# Patient Record
Sex: Female | Born: 1945
Health system: Southern US, Community
[De-identification: ages and names within clinical notes are randomized; demographics above are authoritative.]

## PROBLEM LIST (undated history)

## (undated) DIAGNOSIS — D649 Anemia, unspecified: Secondary | ICD-10-CM

## (undated) DIAGNOSIS — K219 Gastro-esophageal reflux disease without esophagitis: Secondary | ICD-10-CM

## (undated) DIAGNOSIS — I10 Essential (primary) hypertension: Secondary | ICD-10-CM

## (undated) DIAGNOSIS — H269 Unspecified cataract: Secondary | ICD-10-CM

## (undated) DIAGNOSIS — C4491 Basal cell carcinoma of skin, unspecified: Secondary | ICD-10-CM

## (undated) DIAGNOSIS — M1990 Unspecified osteoarthritis, unspecified site: Secondary | ICD-10-CM

## (undated) DIAGNOSIS — K5790 Diverticulosis of intestine, part unspecified, without perforation or abscess without bleeding: Secondary | ICD-10-CM

## (undated) DIAGNOSIS — E039 Hypothyroidism, unspecified: Secondary | ICD-10-CM

## (undated) DIAGNOSIS — K635 Polyp of colon: Secondary | ICD-10-CM

## (undated) DIAGNOSIS — G43909 Migraine, unspecified, not intractable, without status migrainosus: Secondary | ICD-10-CM

## (undated) DIAGNOSIS — M8580 Other specified disorders of bone density and structure, unspecified site: Secondary | ICD-10-CM

## (undated) DIAGNOSIS — M810 Age-related osteoporosis without current pathological fracture: Secondary | ICD-10-CM

## (undated) HISTORY — DX: Gastro-esophageal reflux disease without esophagitis: K21.9

## (undated) HISTORY — DX: Unspecified cataract: H26.9

## (undated) HISTORY — DX: Basal cell carcinoma of skin, unspecified: C44.91

## (undated) HISTORY — DX: Migraine, unspecified, not intractable, without status migrainosus: G43.909

## (undated) HISTORY — DX: Polyp of colon: K63.5

## (undated) HISTORY — DX: Other specified disorders of bone density and structure, unspecified site: M85.80

## (undated) HISTORY — DX: Age-related osteoporosis without current pathological fracture: M81.0

## (undated) HISTORY — DX: Diverticulosis of intestine, part unspecified, without perforation or abscess without bleeding: K57.90

## (undated) HISTORY — PX: COLONOSCOPY: SHX174

## (undated) HISTORY — DX: Anemia, unspecified: D64.9

---
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
## 1996-05-17 HISTORY — PX: CARPAL TUNNEL RELEASE: SHX101

## 1996-05-17 HISTORY — PX: OTHER SURGICAL HISTORY: SHX169

## 1998-04-16 ENCOUNTER — Other Ambulatory Visit: Admission: RE | Admit: 1998-04-16 | Discharge: 1998-04-16 | Payer: Self-pay | Admitting: Obstetrics and Gynecology

## 2005-10-25 ENCOUNTER — Encounter: Admission: RE | Admit: 2005-10-25 | Discharge: 2005-10-25 | Payer: Self-pay | Admitting: Endocrinology

## 2006-01-01 ENCOUNTER — Emergency Department (HOSPITAL_COMMUNITY): Admission: EM | Admit: 2006-01-01 | Discharge: 2006-01-01 | Payer: Self-pay | Admitting: Emergency Medicine

## 2012-08-27 ENCOUNTER — Encounter (HOSPITAL_COMMUNITY): Payer: Self-pay | Admitting: *Deleted

## 2012-08-27 ENCOUNTER — Emergency Department (HOSPITAL_COMMUNITY)
Admission: EM | Admit: 2012-08-27 | Discharge: 2012-08-27 | Disposition: A | Discharge status: Home / Self Care | Payer: PRIVATE HEALTH INSURANCE | Attending: Emergency Medicine | Admitting: Emergency Medicine

## 2012-08-27 DIAGNOSIS — Y929 Unspecified place or not applicable: Secondary | ICD-10-CM | POA: Insufficient documentation

## 2012-08-27 DIAGNOSIS — W2203XA Walked into furniture, initial encounter: Secondary | ICD-10-CM | POA: Insufficient documentation

## 2012-08-27 DIAGNOSIS — Z23 Encounter for immunization: Secondary | ICD-10-CM | POA: Insufficient documentation

## 2012-08-27 DIAGNOSIS — M129 Arthropathy, unspecified: Secondary | ICD-10-CM | POA: Insufficient documentation

## 2012-08-27 DIAGNOSIS — Y9389 Activity, other specified: Secondary | ICD-10-CM | POA: Insufficient documentation

## 2012-08-27 DIAGNOSIS — E039 Hypothyroidism, unspecified: Secondary | ICD-10-CM | POA: Insufficient documentation

## 2012-08-27 DIAGNOSIS — S0180XA Unspecified open wound of other part of head, initial encounter: Secondary | ICD-10-CM | POA: Insufficient documentation

## 2012-08-27 DIAGNOSIS — I10 Essential (primary) hypertension: Secondary | ICD-10-CM | POA: Insufficient documentation

## 2012-08-27 DIAGNOSIS — S0181XA Laceration without foreign body of other part of head, initial encounter: Secondary | ICD-10-CM

## 2012-08-27 HISTORY — DX: Essential (primary) hypertension: I10

## 2012-08-27 HISTORY — DX: Hypothyroidism, unspecified: E03.9

## 2012-08-27 HISTORY — DX: Unspecified osteoarthritis, unspecified site: M19.90

## 2012-08-27 MED ORDER — TETANUS-DIPHTH-ACELL PERTUSSIS 5-2.5-18.5 LF-MCG/0.5 IM SUSP
0.5000 mL | Freq: Once | INTRAMUSCULAR | Status: AC
  Administered 2012-08-27: 0.5 mL via INTRAMUSCULAR
  Filled 2012-08-27: qty 0.5

## 2012-08-27 NOTE — ED Provider Notes (Signed)
History    This chart was scribed for non-physician practitioner Wynetta Emery PA-C working with Derwood Kaplan, MD by Smitty Pluck, ED scribe. This patient was seen in room WTR8/WTR8 and the patient's care was started at 8:19 PM.   CSN: [id number]  Arrival date & time 08/27/12  1829      Chief Complaint  Patient presents with  . Facial Laceration     The history is provided by the patient. No language interpreter was used.   Alexis Beard is a 67 y.o. female who presents to the Emergency Department complaining of facial laceration onset today. Pt reports that she was moving an umbrella stand and it came back hitting her in the forehead. Pt denies headache, blurred vision, fever, chills, nausea, vomiting, diarrhea, weakness, cough, SOB and any other pain. She denies taking blood thinners. Denies tetanus being UTD.    Past Medical History  Diagnosis Date  . Hypertension   . Hypothyroid   . Arthritis     Past Surgical History  Procedure Laterality Date  . Carpal tunnel release      History reviewed. No pertinent family history.  History  Substance Use Topics  . Smoking status: Never Smoker   . Smokeless tobacco: Never Used  . Alcohol Use: No    OB History   Grav Para Term Preterm Abortions TAB SAB Ect Mult Living                  Review of Systems  Constitutional: Negative for fever.  Respiratory: Negative for shortness of breath.   Cardiovascular: Negative for chest pain.  Gastrointestinal: Negative for nausea, vomiting, abdominal pain and diarrhea.  Skin: Positive for wound.  All other systems reviewed and are negative.    Allergies  Review of patient's allergies indicates not on file.  Home Medications  No current outpatient prescriptions on file.  BP 166/78  Pulse 63  Temp(Src) 98.4 F (36.9 C) (Oral)  Resp 18  SpO2 99%  Physical Exam  Nursing note and vitals reviewed. Constitutional: She is oriented to person, place, and time. She appears  well-developed and well-nourished. No distress.  HENT:  Head: Normocephalic.  Mouth/Throat: Oropharynx is clear and moist.  2cm partial and full thickness, non-jagged laceration to left forehead.   Eyes: Conjunctivae and EOM are normal. Pupils are equal, round, and reactive to light.  Neck: Normal range of motion. Neck supple.  No midline tenderness to palpation or step-offs appreciated. Patient has full range of motion without pain.   Cardiovascular: Normal rate.   Pulmonary/Chest: Effort normal and breath sounds normal. No stridor. No respiratory distress. She has no wheezes. She has no rales. She exhibits no tenderness.  Abdominal: Soft. Bowel sounds are normal. She exhibits no distension and no mass. There is no tenderness. There is no rebound and no guarding.  Musculoskeletal: Normal range of motion.  Neurological: She is alert and oriented to person, place, and time.  Cranial nerves III through XII intact, strength 5 out of 5x4 extremities, negative pronator drift, finger to nose and heel-to-shin coordinated, sensation intact to pinprick and light touch, gait is coordinated and Romberg is negative.   Psychiatric: She has a normal mood and affect.    ED Course  Procedures (including critical care time) DIAGNOSTIC STUDIES: Oxygen Saturation is 99% on room air, normal by my interpretation.    COORDINATION OF CARE: 8:20 PM Discussed ED treatment with pt and pt agrees.  8:22 PM Ordered:  Medications  TDaP (BOOSTRIX)  injection 0.5 mL (0.5 mLs Intramuscular Given 08/27/12 2100)   LACERATION REPAIR Performed by: Wynetta Emery PA-C Consent: Verbal consent obtained. Risks and benefits: risks, benefits and alternatives were discussed Patient identity confirmed: provided demographic data Time out performed prior to procedure Prepped and Draped in normal sterile fashion Wound explored Laceration Location: left forehead Laceration Length: 2cm No Foreign Bodies seen or  palpated Anesthesia: local infiltration Local anesthetic: none Anesthetic total: N/A Irrigation method: syringe Amount of cleaning: standard Skin closure: yes Number of sutures or staples: derma-bond  Technique:  Patient tolerance: Patient tolerated the procedure well with no immediate complications.    Labs Reviewed - No data to display No results found.   1. Facial laceration, initial encounter       MDM   Alexis Beard is a 67 y.o. female with head laceration, questionable LOC, normal neuro exam, forehead laceration repaired without complication.    Filed Vitals:   08/27/12 1840  BP: 166/78  Pulse: 63  Temp: 98.4 F (36.9 C)  TempSrc: Oral  Resp: 18  SpO2: 99%     Pt verbalized understanding and agrees with care plan. Outpatient follow-up and return precautions given.      Wynetta Emery, PA-C 08/31/12 0801  Medical screening examination/treatment/procedure(s) were performed by non-physician practitioner and as supervising physician I was immediately available for consultation/collaboration.  Derwood Kaplan, MD 08/31/12 [phone]

## 2012-08-27 NOTE — ED Notes (Signed)
Pt states was moving an umbrella stand, came back and hit her in the head, states blacked out for a second, denies headache, blurred vision, or nausea at this time, states area is sore, denies being on blood thinners, pt a/o x 4.

## 2012-12-11 ENCOUNTER — Other Ambulatory Visit: Payer: Self-pay | Admitting: *Deleted

## 2012-12-11 MED ORDER — AMLODIPINE BESYLATE 10 MG PO TABS: 5.0000 mg | ORAL_TABLET | Freq: Every day | ORAL | Status: DC

## 2013-02-07 ENCOUNTER — Other Ambulatory Visit: Payer: Self-pay | Admitting: *Deleted

## 2013-02-07 DIAGNOSIS — E039 Hypothyroidism, unspecified: Secondary | ICD-10-CM | POA: Insufficient documentation

## 2013-02-08 ENCOUNTER — Other Ambulatory Visit: Payer: Self-pay | Admitting: Endocrinology

## 2013-02-08 DIAGNOSIS — E559 Vitamin D deficiency, unspecified: Secondary | ICD-10-CM

## 2013-02-08 DIAGNOSIS — R7303 Prediabetes: Secondary | ICD-10-CM

## 2013-02-13 ENCOUNTER — Other Ambulatory Visit: Payer: PRIVATE HEALTH INSURANCE

## 2013-02-15 ENCOUNTER — Ambulatory Visit: Payer: PRIVATE HEALTH INSURANCE | Admitting: Endocrinology

## 2013-05-02 ENCOUNTER — Other Ambulatory Visit (INDEPENDENT_AMBULATORY_CARE_PROVIDER_SITE_OTHER): Payer: Commercial Indemnity

## 2013-05-02 ENCOUNTER — Other Ambulatory Visit: Payer: Self-pay | Admitting: *Deleted

## 2013-05-02 DIAGNOSIS — R7309 Other abnormal glucose: Secondary | ICD-10-CM

## 2013-05-02 DIAGNOSIS — E559 Vitamin D deficiency, unspecified: Secondary | ICD-10-CM

## 2013-05-02 DIAGNOSIS — R7303 Prediabetes: Secondary | ICD-10-CM

## 2013-05-02 DIAGNOSIS — E039 Hypothyroidism, unspecified: Secondary | ICD-10-CM

## 2013-05-02 LAB — TSH: TSH: 5.12 u[IU]/mL (ref 0.35–5.50)

## 2013-05-02 LAB — COMPREHENSIVE METABOLIC PANEL
Albumin: 4.5 g/dL (ref 3.5–5.2)
Alkaline Phosphatase: 75 U/L (ref 39–117)
Calcium: 9.3 mg/dL (ref 8.4–10.5)
Chloride: 103 mEq/L (ref 96–112)
Creatinine, Ser: 0.8 mg/dL (ref 0.4–1.2)
GFR: 73.85 mL/min (ref >60.00)
Sodium: 139 mEq/L (ref 135–145)

## 2013-05-02 LAB — LIPID PANEL
Cholesterol: 232 mg/dL — ABNORMAL HIGH (ref 0–200)
Total CHOL/HDL Ratio: 3
VLDL: 21.6 mg/dL (ref 0.0–40.0)

## 2013-05-02 LAB — LDL CHOLESTEROL, DIRECT: Direct LDL: 135.4 mg/dL

## 2013-05-02 LAB — HEMOGLOBIN A1C: Hgb A1c MFr Bld: 5.8 % (ref 4.6–6.5)

## 2013-05-02 MED ORDER — LEVOTHYROXINE SODIUM 88 MCG PO TABS: 88.0000 ug | ORAL_TABLET | Freq: Every day | ORAL | Status: DC

## 2013-05-03 LAB — VITAMIN D 25 HYDROXY (VIT D DEFICIENCY, FRACTURES): Vit D, 25-Hydroxy: 40 ng/mL (ref 30–89)

## 2013-05-08 ENCOUNTER — Ambulatory Visit (INDEPENDENT_AMBULATORY_CARE_PROVIDER_SITE_OTHER): Payer: Commercial Indemnity | Admitting: Endocrinology

## 2013-05-08 ENCOUNTER — Encounter: Payer: Self-pay | Admitting: Endocrinology

## 2013-05-08 VITALS — BP 112/72 | HR 73 | Temp 98.3°F | Resp 12 | Ht 63.0 in | Wt 190.2 lb

## 2013-05-08 DIAGNOSIS — M545 Low back pain, unspecified: Secondary | ICD-10-CM

## 2013-05-08 DIAGNOSIS — E039 Hypothyroidism, unspecified: Secondary | ICD-10-CM

## 2013-05-08 DIAGNOSIS — E785 Hyperlipidemia, unspecified: Secondary | ICD-10-CM | POA: Insufficient documentation

## 2013-05-08 DIAGNOSIS — R7303 Prediabetes: Secondary | ICD-10-CM

## 2013-05-08 DIAGNOSIS — E559 Vitamin D deficiency, unspecified: Secondary | ICD-10-CM

## 2013-05-08 DIAGNOSIS — R7309 Other abnormal glucose: Secondary | ICD-10-CM

## 2013-05-08 DIAGNOSIS — I10 Essential (primary) hypertension: Secondary | ICD-10-CM | POA: Insufficient documentation

## 2013-05-08 MED ORDER — LEVOTHYROXINE SODIUM 100 MCG PO TABS: 100.0000 ug | ORAL_TABLET | Freq: Every day | ORAL | Status: DC

## 2013-05-08 NOTE — Patient Instructions (Signed)
Low saturated fat diet  Exercise as tolerated

## 2013-05-08 NOTE — Progress Notes (Signed)
Patient ID: Alexis Beard, female   DOB: 11-Aug-1945, 67 y.o.   MRN: [id number]  Alexis Beard is a 67 y.o. female.   Chief complaint:  Followup of multiple problems  History of Present Illness:  1. Her previous records are not available but she has had hypothyroidism since about 1995 there has been usually on a steady dose of 88 mcg. Several years ago was taking 112 mcg.Marland Kitchen Her TSH has been usually quite normal. She feels fairly good without any unusual fatigue or weight change. She has been taking her medication very consistently even recently when she had nausea but her TSH is higher. No cold intolerance has not used any iron or calcium supplements 2. She has had an upper respiratory infection for at least a week and has been treated by an urgent care Center. She had significant cough and was given various medications. However because of the cough she had a pain start on the right lower back area which is usually present on coughing or movement. It is somewhat better. Her cough is also improving with clear sputum now. She is taking doxycycline and has 4 days left. Did have some nausea with this 3. Her lipids are relatively high. Usually her LDL has been near 100 in the past and has not been on any medications. She thinks she is eating more desserts like cakes which she has been baking; she is  now semiretired. She is also eating meat regularly since her husband prefers this 4.  She thinks she had shingles recently and was having a rash; was given? Famvir  by urgent care Center but this caused nausea. Records not available 5. She has a history of prediabetes and her glucose is borderline at 101 with normal A1c 6. Hypertension: This is well-controlled with only 5 mg Norvasc 7. Vitamin D deficiency: She normally takes 50,000 units weekly and has just started back on this month ago. She did not take it during summer because she thought she was getting enough through sunshine. Her level is therapeutic       Medication List       This list is accurate as of: 05/08/13  9:07 PM.  Always use your most recent med list.               amLODipine 10 MG tablet  Commonly known as:  NORVASC  Take 0.5 tablets (5 mg total) by mouth daily.     levothyroxine 100 MCG tablet  Commonly known as:  SYNTHROID, LEVOTHROID  Take 1 tablet (100 mcg total) by mouth daily.     meloxicam 7.5 MG tablet  Commonly known as:  MOBIC  Take 7.5 mg by mouth daily.     Vitamin D (Ergocalciferol) 50000 UNITS Caps capsule  Commonly known as:  DRISDOL  Take 50,000 Units by mouth every 7 (seven) days.        Allergies:  Allergies  Allergen Reactions  . Codeine Nausea And Vomiting    Past Medical History  Diagnosis Date  . Hypertension   . Hypothyroid   . Arthritis   . Melanoma     Foot    Past Surgical History  Procedure Laterality Date  . Carpal tunnel release    . Birth mark removal      on (R) side of face    Family History  Problem Relation Age of Onset  . Diabetes Mother   . Hyperlipidemia Father   . Hyperlipidemia Sister   . Thyroid disease  Neg Hx     Social History:  reports that she has never smoked. She has never used smokeless tobacco. She reports that she does not drink alcohol or use illicit drugs.  Review of Systems   Appointment on 05/02/2013  Component Date Value Range Status  . Free T4 05/02/2013 0.87  0.60 - 1.60 ng/dL Final  . TSH 16/02/9603 5.12  0.35 - 5.50 uIU/mL Final  . Sodium 05/02/2013 139  135 - 145 mEq/L Final  . Potassium 05/02/2013 4.1  3.5 - 5.1 mEq/L Final  . Chloride 05/02/2013 103  96 - 112 mEq/L Final  . CO2 05/02/2013 28  19 - 32 mEq/L Final  . Glucose, Bld 05/02/2013 101* 70 - 99 mg/dL Final  . BUN 54/01/8118 14  6 - 23 mg/dL Final  . Creatinine, Ser 05/02/2013 0.8  0.4 - 1.2 mg/dL Final  . Total Bilirubin 05/02/2013 0.5  0.3 - 1.2 mg/dL Final  . Alkaline Phosphatase 05/02/2013 75  39 - 117 U/L Final  . AST 05/02/2013 21  0 - 37 U/L Final   . ALT 05/02/2013 18  0 - 35 U/L Final  . Total Protein 05/02/2013 7.6  6.0 - 8.3 g/dL Final  . Albumin 14/78/2956 4.5  3.5 - 5.2 g/dL Final  . Calcium 21/30/8657 9.3  8.4 - 10.5 mg/dL Final  . GFR 84/69/6295 73.85  >60.00 mL/min Final  . Cholesterol 05/02/2013 232* 0 - 200 mg/dL Final   ATP III Classification       Desirable:  < 200 mg/dL               Borderline High:  200 - 239 mg/dL          High:  > = 284 mg/dL  . Triglycerides 05/02/2013 108.0  0.0 - 149.0 mg/dL Final   Normal:  <132 mg/dLBorderline High:  150 - 199 mg/dL  . HDL 05/02/2013 77.10  >39.00 mg/dL Final  . VLDL 44/05/270 21.6  0.0 - 40.0 mg/dL Final  . Total CHOL/HDL Ratio 05/02/2013 3   Final                  Men          Women1/2 Average Risk     3.4          3.3Average Risk          5.0          4.42X Average Risk          9.6          7.13X Average Risk          15.0          11.0                      . Vit D, 25-Hydroxy 05/02/2013 40  30 - 89 ng/mL Final   Comment: This assay accurately quantifies Vitamin D, which is the sum of the                          25-Hydroxy forms of Vitamin D2 and D3.  Studies have shown that the                          optimum concentration of 25-Hydroxy Vitamin D is 30 ng/mL or higher.  Concentrations of Vitamin D between 20 and 29 ng/mL are considered to                          be insufficient and concentrations less than 20 ng/mL are considered                          to be deficient for Vitamin D.  . Hemoglobin A1C 05/02/2013 5.8  4.6 - 6.5 % Final   Glycemic Control Guidelines for People with Diabetes:Non Diabetic:  <6%Goal of Therapy: <7%Additional Action Suggested:  >8%   . Direct LDL 05/02/2013 135.4   Final   Optimal:  <100 mg/dLNear or Above Optimal:  100-129 mg/dLBorderline High:  130-159 mg/dLHigh:  160-189 mg/dLVery High:  >190 mg/dL    EXAM:  BP 161/09  Pulse 73  Temp(Src) 98.3 F (36.8 C)  Resp 12  Ht 5\' 3"  (1.6 m)  Wt 190 lb 3.2 oz  (86.274 kg)  BMI 33.70 kg/m2  SpO2 94%  Her right lower back pain is indicated in the paraspinal area but there is no muscle spasm or tenderness. No rib cage tenderness Lungs clear No edema  Assessment/Plan:    Primary hypothyroidism: Since her TSH is upper normal to increase her dosage to 100 mcg and she will followup in 3 months  Hyperlipidemia: Discussed low saturated fat diet. Given handout on this and discussed need for consistent improving diet especially with her history of prediabetes  Low back pain: She appears to have muscle strain, no specific treatment needed  Vitamin D deficiency: Continue weekly dosage to winter at least  Hypertension: Well controlled and she will stay on Norvasc   [facility] 05/08/2013, 9:07 PM

## 2013-08-01 ENCOUNTER — Other Ambulatory Visit: Payer: PRIVATE HEALTH INSURANCE

## 2013-08-02 ENCOUNTER — Other Ambulatory Visit: Payer: PRIVATE HEALTH INSURANCE

## 2013-08-02 ENCOUNTER — Other Ambulatory Visit (INDEPENDENT_AMBULATORY_CARE_PROVIDER_SITE_OTHER): Payer: Commercial Indemnity

## 2013-08-02 DIAGNOSIS — I10 Essential (primary) hypertension: Secondary | ICD-10-CM

## 2013-08-02 DIAGNOSIS — M545 Low back pain, unspecified: Secondary | ICD-10-CM

## 2013-08-02 DIAGNOSIS — E785 Hyperlipidemia, unspecified: Secondary | ICD-10-CM

## 2013-08-02 DIAGNOSIS — E039 Hypothyroidism, unspecified: Secondary | ICD-10-CM

## 2013-08-02 LAB — T4, FREE: Free T4: 1.04 ng/dL (ref 0.60–1.60)

## 2013-08-02 LAB — URINALYSIS, ROUTINE W REFLEX MICROSCOPIC
Bilirubin Urine: NEGATIVE
Ketones, ur: NEGATIVE
Leukocytes, UA: NEGATIVE
Nitrite: NEGATIVE
PH: 5 (ref 5.0–8.0)
TOTAL PROTEIN, URINE-UPE24: NEGATIVE
UROBILINOGEN UA: 0.2 (ref 0.0–1.0)
Urine Glucose: NEGATIVE

## 2013-08-02 LAB — LIPID PANEL
CHOLESTEROL: 192 mg/dL (ref 0–200)
HDL: 75 mg/dL (ref >39.00)
LDL Cholesterol: 94 mg/dL (ref 0–99)
TRIGLYCERIDES: 117 mg/dL (ref 0.0–149.0)
Total CHOL/HDL Ratio: 3
VLDL: 23.4 mg/dL (ref 0.0–40.0)

## 2013-08-02 LAB — COMPREHENSIVE METABOLIC PANEL
ALBUMIN: 4.4 g/dL (ref 3.5–5.2)
ALK PHOS: 68 U/L (ref 39–117)
ALT: 16 U/L (ref 0–35)
AST: 16 U/L (ref 0–37)
BILIRUBIN TOTAL: 0.5 mg/dL (ref 0.3–1.2)
BUN: 12 mg/dL (ref 6–23)
CALCIUM: 9.2 mg/dL (ref 8.4–10.5)
CO2: 26 mEq/L (ref 19–32)
CREATININE: 0.7 mg/dL (ref 0.4–1.2)
Chloride: 104 mEq/L (ref 96–112)
GFR: 84.39 mL/min (ref >60.00)
GLUCOSE: 90 mg/dL (ref 70–99)
Potassium: 4.3 mEq/L (ref 3.5–5.1)
Sodium: 140 mEq/L (ref 135–145)
TOTAL PROTEIN: 7.5 g/dL (ref 6.0–8.3)

## 2013-08-02 LAB — CBC
HEMATOCRIT: 40.6 % (ref 36.0–46.0)
HEMOGLOBIN: 13.5 g/dL (ref 12.0–15.0)
MCHC: 33.3 g/dL (ref 30.0–36.0)
MCV: 90.2 fl (ref 78.0–100.0)
PLATELETS: 255 10*3/uL (ref 150.0–400.0)
RBC: 4.51 Mil/uL (ref 3.87–5.11)
RDW: 13.3 % (ref 11.5–14.6)
WBC: 5.1 10*3/uL (ref 4.5–10.5)

## 2013-08-02 LAB — TSH: TSH: 1.09 u[IU]/mL (ref 0.35–5.50)

## 2013-08-07 ENCOUNTER — Encounter: Payer: Self-pay | Admitting: Endocrinology

## 2013-08-07 ENCOUNTER — Ambulatory Visit (INDEPENDENT_AMBULATORY_CARE_PROVIDER_SITE_OTHER): Payer: Commercial Indemnity | Admitting: Endocrinology

## 2013-08-07 VITALS — BP 128/82 | HR 64 | Temp 98.3°F | Resp 16 | Ht 63.0 in | Wt 190.6 lb

## 2013-08-07 DIAGNOSIS — Z0000 Encounter for general adult medical examination without abnormal findings: Secondary | ICD-10-CM

## 2013-08-07 DIAGNOSIS — R7303 Prediabetes: Secondary | ICD-10-CM

## 2013-08-07 DIAGNOSIS — I10 Essential (primary) hypertension: Secondary | ICD-10-CM

## 2013-08-07 DIAGNOSIS — R7309 Other abnormal glucose: Secondary | ICD-10-CM

## 2013-08-07 DIAGNOSIS — E785 Hyperlipidemia, unspecified: Secondary | ICD-10-CM

## 2013-08-07 MED ORDER — CELECOXIB 200 MG PO CAPS: 200.0000 mg | ORAL_CAPSULE | Freq: Every day | ORAL | Status: DC

## 2013-08-07 NOTE — Patient Instructions (Addendum)
Continue regular walking Followup with gastroenterologist for colonoscopy  You will be scheduled for followup mammogram  Take vitamin D once a month regularly  No change in the blood pressure or thyroid medication  Continue to reduce saturated fats from fatty meats and dairy products  Switch meloxicam to Celebrex 200 mg as needed for low back pain

## 2013-08-07 NOTE — Progress Notes (Signed)
Patient ID: Alexis Beard, female   DOB: 23-Jul-1945, 68 y.o.   MRN: [id number]   Chief complaint:  Followup of multiple problems  History of Present Illness:     She has had hypothyroidism since about 1995 and previously had been  on a steady dose of 88 mcg. Several years ago was taking 112 mcg.Marland Kitchen Her TSH was higher in 12/14 and her dose was increased to 100 mcg. She feels fairly good without any unusual fatigue or cold intolerance. She has been taking her Synthroid very consistently in the mornings.   Lab Results  Component Value Date   TSH 1.09 08/02/2013   Her cholesterol was higher than usual on the last visit.  Usually her LDL has been near 100 in the past and has not been on any medications. She apparently has tried to change her diet with cutting out desserts like cakes and usually trying to eat low fat meats. LDL has improved and is below 100 again.    She has a history of prediabetes and her glucose is better at 90 now with normal A1c on the last visit   Hypertension: she has had long-standing mild hypertension which is well-controlled with only 5 mg Norvasc recently   Vitamin D deficiency: She previously had been on 50,000 units weekly and had taken this more regularly during winter months but only sporadically now. Her last level was 40.   PREVENTIVE CARE:  Diet: Avoiding sweets and cutting back on high fat foods Exercise: Recently starting to walk, limited by hip pain History of falls: None   Stool Hemoccults:           at annual exams   Breast self exams:                             yes   Colonoscopy/sigmoidoscopy 02/2009 Mammograms:  ? 2013   Yearly flu vaccine:                      none in 2014   Bone Density:   ? 2013  Calcium supplements:  yes  Tetanus booster:  ?   Zostavax: none Pneumovax:  no    Lipid screening, last levels:  Lab Results  Component Value Date   CHOL 192 08/02/2013   HDL 75.00 08/02/2013   LDLCALC 94 08/02/2013   LDLDIRECT 135.4 05/02/2013    TRIG 117.0 08/02/2013   CHOLHDL 3 08/02/2013       Medication List       This list is accurate as of: 08/07/13 11:17 AM.  Always use your most recent med list.               amLODipine 10 MG tablet  Commonly known as:  NORVASC  Take 0.5 tablets (5 mg total) by mouth daily.     celecoxib 200 MG capsule  Commonly known as:  CELEBREX  Take 1 capsule (200 mg total) by mouth daily.     levothyroxine 100 MCG tablet  Commonly known as:  SYNTHROID, LEVOTHROID  Take 1 tablet (100 mcg total) by mouth daily.     Vitamin D (Ergocalciferol) 50000 UNITS Caps capsule  Commonly known as:  DRISDOL  Take 50,000 Units by mouth every 7 (seven) days.        Allergies:  Allergies  Allergen Reactions  . Codeine Nausea And Vomiting    Past Medical History  Diagnosis Date  . Hypertension   .  Hypothyroid   . Arthritis   . Cancer     Basal cell Carcinoma    Past Surgical History  Procedure Laterality Date  . Carpal tunnel release    . Birth mark removal      on (R) side of face    Family History  Problem Relation Age of Onset  . Diabetes Mother   . Thyroid disease Mother   . Hyperlipidemia Father   . Heart disease Father   . Hyperlipidemia Sister   . Cancer Maternal Aunt     Breast Cancer  . Thyroid disease Maternal Aunt     Social History:  reports that she has never smoked. She has never used smokeless tobacco. She reports that she does not drink alcohol or use illicit drugs.  Review of Systems       Eyes: Has been told to have early cataracts.   ENT: No nasal congestion or ear problems    No recent headaches.  History of recurrent migraines but none since Norvasc started in 2/06    Skin: Has several moles and history of cancers. Currently followed by a dermatologist.    Thyroid:  had goiter at diagnosis of hypothyroidism over 10 years ago.  In that time she had improvement in fatigue and 40 pounds weight loss with thyroxine replacement.  Synthroid was reduced to 0.1  mg in 7/05     No chest pain on exertion.                 No palpitations except when she is anxious.     No swelling of feet.     No shortness of breath on exertion.     Bowel habits:  chronic constipation.          No abdominal pain.  Told to have diverticulosis and has had colon polyps. Recommended colonoscopy every 3 years       No frequency of urination, nocturia.      Gynecological symptoms: no recent hot flushes.  Previously had taken Prempro      She has had mild joint pains especially in the morning in her fingers.    She has had periodic low back/left buttock pain.  She had L2-3 degenerative disc disease on her spine x-ray.  Now will occasionally take Mobic as needed with relief.     Has osteopenia with T score on the femoral neck -1.5 in 9/08.  Original height 5'5". Has been prescribed Evista and also tried on Actonel but did not continue it.     She has had periodic leg cramps in her legs. Less frequently now.  LABS:  Appointment on 08/02/2013  Component Date Value Ref Range Status  . Sodium 08/02/2013 140  135 - 145 mEq/L Final  . Potassium 08/02/2013 4.3  3.5 - 5.1 mEq/L Final  . Chloride 08/02/2013 104  96 - 112 mEq/L Final  . CO2 08/02/2013 26  19 - 32 mEq/L Final  . Glucose, Bld 08/02/2013 90  70 - 99 mg/dL Final  . BUN 04/54/098103/19/2015 12  6 - 23 mg/dL Final  . Creatinine, Ser 08/02/2013 0.7  0.4 - 1.2 mg/dL Final  . Total Bilirubin 08/02/2013 0.5  0.3 - 1.2 mg/dL Final  . Alkaline Phosphatase 08/02/2013 68  39 - 117 U/L Final  . AST 08/02/2013 16  0 - 37 U/L Final  . ALT 08/02/2013 16  0 - 35 U/L Final  . Total Protein 08/02/2013 7.5  6.0 - 8.3 g/dL Final  .  Albumin 08/02/2013 4.4  3.5 - 5.2 g/dL Final  . Calcium 08/02/2013 9.2  8.4 - 10.5 mg/dL Final  . GFR 08/02/2013 84.39  >60.00 mL/min Final  . TSH 08/02/2013 1.09  0.35 - 5.50 uIU/mL Final  . Free T4 08/02/2013 1.04  0.60 - 1.60 ng/dL Final  . WBC 08/02/2013 5.1  4.5 - 10.5 K/uL Final  . RBC 08/02/2013  4.51  3.87 - 5.11 Mil/uL Final  . Platelets 08/02/2013 255.0  150.0 - 400.0 K/uL Final  . Hemoglobin 08/02/2013 13.5  12.0 - 15.0 g/dL Final  . HCT 08/02/2013 40.6  36.0 - 46.0 % Final  . MCV 08/02/2013 90.2  78.0 - 100.0 fl Final  . MCHC 08/02/2013 33.3  30.0 - 36.0 g/dL Final  . RDW 08/02/2013 13.3  11.5 - 14.6 % Final  . Color, Urine 08/02/2013 YELLOW  Yellow;Lt. Yellow Final  . APPearance 08/02/2013 CLEAR  Clear Final  . Specific Gravity, Urine 08/02/2013 >=1.030* 1.000 - 1.030 Final  . pH 08/02/2013 5.0  5.0 - 8.0 Final  . Total Protein, Urine 08/02/2013 NEGATIVE  Negative Final  . Urine Glucose 08/02/2013 NEGATIVE  Negative Final  . Ketones, ur 08/02/2013 NEGATIVE  Negative Final  . Bilirubin Urine 08/02/2013 NEGATIVE  Negative Final  . Hgb urine dipstick 08/02/2013 MODERATE* Negative Final  . Urobilinogen, UA 08/02/2013 0.2  0.0 - 1.0 Final  . Leukocytes, UA 08/02/2013 NEGATIVE  Negative Final  . Nitrite 08/02/2013 NEGATIVE  Negative Final  . WBC, UA 08/02/2013 0-2/hpf  0-2/hpf Final  . RBC / HPF 08/02/2013 0-2/hpf  0-2/hpf Final  . Mucus, UA 08/02/2013 Presence of* None Final  . Squamous Epithelial / LPF 08/02/2013 Rare(0-4/hpf)  Rare(0-4/hpf) Final  . Uric Acid Crys, UA 08/02/2013 Presence of* None Final  . Cholesterol 08/02/2013 192  0 - 200 mg/dL Final   ATP III Classification       Desirable:  < 200 mg/dL               Borderline High:  200 - 239 mg/dL          High:  > = 240 mg/dL  . Triglycerides 08/02/2013 117.0  0.0 - 149.0 mg/dL Final   Normal:  <150 mg/dLBorderline High:  150 - 199 mg/dL  . HDL 08/02/2013 75.00  >39.00 mg/dL Final  . VLDL 08/02/2013 23.4  0.0 - 40.0 mg/dL Final  . LDL Cholesterol 08/02/2013 94  0 - 99 mg/dL Final  . Total CHOL/HDL Ratio 08/02/2013 3   Final                  Men          Women1/2 Average Risk     3.4          3.3Average Risk          5.0          4.42X Average Risk          9.6          7.13X Average Risk          15.0          11.0                         EXAM:  BP 128/82  Pulse 64  Temp(Src) 98.3 F (36.8 C)  Resp 16  Ht 5\' 3"  (1.6 m)  Wt 190 lb 9.6 oz (86.456 kg)  BMI 33.77 kg/m2  SpO2 96%  GENERAL:  Large, mild generalized obesity present.       No pallor, clubbing, lymphadenopathy or edema. No skin rash or pigmentation.   Benign looking skin pigmentation spots and nevi on trunk  EYES:  Externally normal.  Fundii:  normal discs and vessels bilaterally  ENT:   Oral cavity, tongue & pharynx normal.                                     THYROID:  Not palpable.  CAROTIDS:  Normal character; no bruit.  HEART:  Normal S1 and S2; no murmur or click.  CHEST:  Lungs: Vescicular breath sounds heard equally.  No crepitations/ wheeze.  BREASTS:  no mass palpable on either side  ABDOMEN:  No distention.  Liver and spleen not palpable.  No other mass or tenderness.    RECTAL exam:  Not indicated.                     Pelvic:  Not indicated.  NEUROLOGICAL:  Reflexes are normal bilaterally at ankles.  SPINE AND JOINTS:   Right second toe has hammertoe deformity. Has mild kyphoscoliosis of lower thoracic spine.  PERIPHERAL PULSES: posterior tibialis normal bilaterally, absent dorsalis pedis bilaterally.   Assessment/Plan:    Primary hypothyroidism: Since her TSH is back to normal with her dosage of 100 mcg she will continue same doses and will check her levels again in about 6 months  Hyperlipidemia: Continue low saturated fat diet which she is doing well with and has been able to get her LDL back down to below 100  Low back pain: She has had intermittent symptoms. Since she does not tolerate the Mobic because of abdominal distress will switch to generic Celebrex 200 mg  Vitamin D deficiency: Continue supplement indefinitely because of her previous deficiency, she can go to a maintenance dose of once a month  Hypertension: Well controlled and she will stay on Norvasc 5 mg daily. To evaluate EKG  today  PREVENTIVE care:  She needs the following and these will be scheduled as discussed   Mammogram, this will be done soon  Bone density will be deferred since it has been stable since 2006  Colonoscopy: She is due this year and she will call her gastroenterologist as scheduled. Will skip stool Hemoccult this year  Zostavax: She is still reluctant to have this done even though she reports recurrent shingles. She will let us know on her next visit  Pneumovax: She is a candidate with her age of over 32 and again she wants to wait on this  Recommended annual influenza vaccine  Currently does not need prophylactic aspirin as she is not at high risk  Advised regular exercise for weight loss   Alexis Beard 08/07/2013, 11:17 AM

## 2013-08-09 ENCOUNTER — Encounter: Payer: Self-pay | Admitting: Endocrinology

## 2013-12-04 ENCOUNTER — Ambulatory Visit: Payer: Commercial Indemnity | Admitting: Endocrinology

## 2013-12-07 ENCOUNTER — Encounter: Payer: Self-pay | Admitting: Endocrinology

## 2013-12-07 ENCOUNTER — Ambulatory Visit (INDEPENDENT_AMBULATORY_CARE_PROVIDER_SITE_OTHER): Payer: Commercial Indemnity | Admitting: Endocrinology

## 2013-12-07 VITALS — BP 130/70 | HR 62 | Temp 98.5°F | Resp 12 | Wt 186.8 lb

## 2013-12-07 DIAGNOSIS — E039 Hypothyroidism, unspecified: Secondary | ICD-10-CM

## 2013-12-07 DIAGNOSIS — R7303 Prediabetes: Secondary | ICD-10-CM

## 2013-12-07 DIAGNOSIS — R7309 Other abnormal glucose: Secondary | ICD-10-CM

## 2013-12-07 DIAGNOSIS — E785 Hyperlipidemia, unspecified: Secondary | ICD-10-CM

## 2013-12-07 DIAGNOSIS — I10 Essential (primary) hypertension: Secondary | ICD-10-CM

## 2013-12-07 LAB — BASIC METABOLIC PANEL
BUN: 11 mg/dL (ref 6–23)
CALCIUM: 9.5 mg/dL (ref 8.4–10.5)
CO2: 28 mEq/L (ref 19–32)
Chloride: 101 mEq/L (ref 96–112)
Creatinine, Ser: 0.8 mg/dL (ref 0.4–1.2)
GFR: 74.77 mL/min (ref >60.00)
Glucose, Bld: 98 mg/dL (ref 70–99)
Potassium: 3.9 mEq/L (ref 3.5–5.1)
SODIUM: 137 meq/L (ref 135–145)

## 2013-12-07 LAB — HEMOGLOBIN A1C: HEMOGLOBIN A1C: 5.7 % (ref 4.6–6.5)

## 2013-12-07 NOTE — Progress Notes (Signed)
Patient ID: Alexis Beard, female   DOB: May 24, 1945, 68 y.o.   MRN: [id number]   Chief complaint:  Followup of multiple problems  History of Present Illness:     She has had hypothyroidism since about 1995 and previously had been on initial dose of 88 mcg. Several years ago was taking 112 mcg.Marland Kitchen Her TSH was higher in 12/14 and her dose was increased to 100 mcg. TSH normal in 3/15. She feels fairly good without any unusual fatigue or significant weight change. She has been taking her Synthroid consistently in the mornings.   Lab Results  Component Value Date   TSH 1.09 08/02/2013    Her cholesterol has been occasionally high when she does not watch her desserts and meats.  Usually her LDL has been near 100 in the past and has not been on any medications. She is recently trying to be relatively good with her diet and increasing her vegetables and less processed foods   Lab Results  Component Value Date   CHOL 192 08/02/2013   HDL 75.00 08/02/2013   LDLCALC 94 08/02/2013   LDLDIRECT 135.4 05/02/2013   TRIG 117.0 08/02/2013   CHOLHDL 3 08/02/2013      She has a history of prediabetes and her glucose was last 90 with normal A1c. She has been trying to walk her dog and has lost 4 pounds. Has not had any  Fasting labs recently  Lab Results  Component Value Date   HGBA1C 5.8 05/02/2013    Wt Readings from Last 3 Encounters:  12/07/13 186 lb 12.8 oz (84.732 kg)  08/07/13 190 lb 9.6 oz (86.456 kg)  05/08/13 190 lb 3.2 oz (86.274 kg)     Hypertension: she has had long-standing mild hypertension which is well-controlled with only 5 mg Norvasc   Vitamin D deficiency: She previously had been on 50,000 units weekly and since her last visit has taken it once a month as directed. Her last level was 40.       Medication List       This list is accurate as of: 12/07/13  4:05 PM.  Always use your most recent med list.               amLODipine 10 MG tablet  Commonly known as:  NORVASC   Take 0.5 tablets (5 mg total) by mouth daily.     celecoxib 200 MG capsule  Commonly known as:  CELEBREX  Take 1 capsule (200 mg total) by mouth daily.     levothyroxine 100 MCG tablet  Commonly known as:  SYNTHROID, LEVOTHROID  Take 1 tablet (100 mcg total) by mouth daily.     Vitamin D (Ergocalciferol) 50000 UNITS Caps capsule  Commonly known as:  DRISDOL  Take 50,000 Units by mouth every 7 (seven) days.        Allergies:  Allergies  Allergen Reactions  . Codeine Nausea And Vomiting    Past Medical History  Diagnosis Date  . Hypertension   . Hypothyroid   . Arthritis   . Cancer     Basal cell Carcinoma    Past Surgical History  Procedure Laterality Date  . Carpal tunnel release    . Birth mark removal      on (R) side of face    Family History  Problem Relation Age of Onset  . Diabetes Mother   . Thyroid disease Mother   . Hyperlipidemia Father   . Heart disease Father   .  Hyperlipidemia Sister   . Cancer Maternal Aunt     Breast Cancer  . Thyroid disease Maternal Aunt     Social History:  reports that she has never smoked. She has never used smokeless tobacco. She reports that she does not drink alcohol or use illicit drugs.  Review of Systems    She has had periodic low back/left buttock pain.  She had L2-3 degenerative disc disease on her spine x-ray.   She will occasionally take Mobic as needed with relief.     Has osteopenia with T score on the femoral neck -1.5 in 9/08.  Original height 5'5". Has been prescribed Evista and also tried on Actonel but did not continue it.     She has had periodic rash and stinging on her right buttock area and she thinks this is shingles; it may have initially been diagnosed by a dermatologist. She thinks she has some bumps that come in a line. She thinks Zovirax helps treat this and is reluctant to take Zostavax injection today   LABS:  Pending  EXAM:  BP 130/70  Pulse 62  Temp(Src) 98.5 F (36.9 C)  (Oral)  Resp 12  Wt 186 lb 12.8 oz (84.732 kg)  SpO2 97%  No pedal edema present She has minimal varicosities on her lower legs  Assessment/Plan:    Primary hypothyroidism: Check TSH today  Hyperlipidemia: Controlled on diet alone. Continue low saturated fat diet which she is doing well with along with exercise  Hypertension: Well controlled and she will stay on Norvasc 5 mg daily.    PREVENTIVE care:  She needs the following and schedule  herself   Mammogram  Colonoscopy: She is due this year and she will call her gastroenterologist now to get it scheduled  Zostavax: She is still reluctant to have this done even though she reports recurrent shingles  Also consider Pneumovax   Alexis Beard 12/07/2013, 4:05 PM

## 2013-12-10 LAB — T4, FREE: FREE T4: 1.13 ng/dL (ref 0.60–1.60)

## 2013-12-10 LAB — TSH: TSH: 0.28 u[IU]/mL — ABNORMAL LOW (ref 0.35–4.50)

## 2013-12-18 ENCOUNTER — Other Ambulatory Visit: Payer: Self-pay | Admitting: *Deleted

## 2013-12-18 MED ORDER — LEVOTHYROXINE SODIUM 88 MCG PO TABS: 88.0000 ug | ORAL_TABLET | Freq: Every day | ORAL | Status: DC

## 2013-12-19 ENCOUNTER — Telehealth: Payer: Self-pay | Admitting: Endocrinology

## 2013-12-19 NOTE — Telephone Encounter (Signed)
Rhonda please call patient   Thank you

## 2014-01-03 ENCOUNTER — Other Ambulatory Visit: Payer: Self-pay | Admitting: Endocrinology

## 2014-04-02 ENCOUNTER — Encounter: Payer: Self-pay | Admitting: Endocrinology

## 2014-04-02 ENCOUNTER — Ambulatory Visit (INDEPENDENT_AMBULATORY_CARE_PROVIDER_SITE_OTHER): Payer: Commercial Indemnity | Admitting: Endocrinology

## 2014-04-02 VITALS — BP 134/87 | HR 73 | Temp 98.0°F | Resp 14 | Ht 63.0 in | Wt 187.4 lb

## 2014-04-02 DIAGNOSIS — F411 Generalized anxiety disorder: Secondary | ICD-10-CM

## 2014-04-02 DIAGNOSIS — E039 Hypothyroidism, unspecified: Secondary | ICD-10-CM

## 2014-04-02 DIAGNOSIS — E063 Autoimmune thyroiditis: Secondary | ICD-10-CM

## 2014-04-02 DIAGNOSIS — E785 Hyperlipidemia, unspecified: Secondary | ICD-10-CM

## 2014-04-02 DIAGNOSIS — E038 Other specified hypothyroidism: Secondary | ICD-10-CM

## 2014-04-02 DIAGNOSIS — I10 Essential (primary) hypertension: Secondary | ICD-10-CM

## 2014-04-02 LAB — TSH: TSH: 3.11 u[IU]/mL (ref 0.35–4.50)

## 2014-04-02 LAB — T4, FREE: FREE T4: 0.92 ng/dL (ref 0.60–1.60)

## 2014-04-02 NOTE — Progress Notes (Signed)
Patient ID: Alexis Beard, female   DOB: July 09, 1945, 68 y.o.   MRN: [id number]   Chief complaint:  Followup of multiple problems  History of Present Illness:    Reflux and heartburn: She is complaining of more symptoms now which is worse on bending down and is asking about this being caused by drinking coffee. However has had history of gastroesophageal reflux before and previously has used Prilosec 40 mg with relief. Does not have a dry cough as before. Currently using OTC Alka-Seltzer which is helping better thanTums     She has had hypothyroidism since about 1995 and previously had been on initial dose of 88 mcg. Several years ago was taking 112 mcg.Marland Kitchen Her TSH was higher in 12/14 and her dose was increased to 100 mcg.  However because of relatively low TSH the dose was reduced back to 88 g in 7/15 but she has not had a follow-up She feels fairly good without any unusual fatigue or significant weight change. She has been taking her Synthroid consistently in the mornings.   Lab Results  Component Value Date   TSH 0.28* 12/07/2013    Her cholesterol has been periodically high when she does not watch her desserts and meats.  Usually her LDL has been near 100 in the past and has not been on any medications. She is usuallytrying to be relatively good with her diet and increasing her vegetables and less processed foods   Lab Results  Component Value Date   CHOL 192 08/02/2013   HDL 75.00 08/02/2013   LDLCALC 94 08/02/2013   LDLDIRECT 135.4 05/02/2013   TRIG 117.0 08/02/2013   CHOLHDL 3 08/02/2013      She has a history of prediabetes and her glucose was last 90 with normal A1c. She has been irregular with her walking recently.   Wt Readings from Last 3 Encounters:  04/02/14 187 lb 6.4 oz (85.004 kg)  12/07/13 186 lb 12.8 oz (84.732 kg)  08/07/13 190 lb 9.6 oz (86.456 kg)    Lab Results  Component Value Date   HGBA1C 5.7 12/07/2013   HGBA1C 5.8 05/02/2013   Lab Results   Component Value Date   LDLCALC 94 08/02/2013   CREATININE 0.8 12/07/2013      Hypertension: she has had long-standing mild hypertension which previously was well-controlled with only 5 mg Norvasc but blood pressure is significantly higher today   Vitamin D deficiency: She has been prescribed  50,000 units and  has taken it once a month as directed. Her last level was 40.    She is complaining of anxiety and getting tense easily as well as some difficulty sleeping at night.  Having some stress issues also.  Does not want a medication for sleep or anxiety at this time.  Does not think she is depressed      Medication List       This list is accurate as of: 04/02/14 11:45 AM.  Always use your most recent med list.               amLODipine 10 MG tablet  Commonly known as:  NORVASC  TAKE HALF TABLET BY MOUTH DAILY     aspirin-sod bicarb-citric acid 325 MG Tbef tablet  Commonly known as:  ALKA-SELTZER  Take 325 mg by mouth every 6 (six) hours as needed.     celecoxib 200 MG capsule  Commonly known as:  CELEBREX  Take 1 capsule (200 mg total) by mouth daily.  levothyroxine 88 MCG tablet  Commonly known as:  SYNTHROID, LEVOTHROID  Take 1 tablet (88 mcg total) by mouth daily.     Vitamin D (Ergocalciferol) 50000 UNITS Caps capsule  Commonly known as:  DRISDOL  Take 50,000 Units by mouth every 7 (seven) days.        Allergies:  Allergies  Allergen Reactions  . Codeine Nausea And Vomiting    Past Medical History  Diagnosis Date  . Hypertension   . Hypothyroid   . Arthritis   . Cancer     Basal cell Carcinoma    Past Surgical History  Procedure Laterality Date  . Carpal tunnel release    . Birth mark removal      on (R) side of face    Family History  Problem Relation Age of Onset  . Diabetes Mother   . Thyroid disease Mother   . Hyperlipidemia Father   . Heart disease Father   . Hyperlipidemia Sister   . Cancer Maternal Aunt     Breast Cancer   . Thyroid disease Maternal Aunt     Social History:  reports that she has never smoked. She has never used smokeless tobacco. She reports that she does not drink alcohol or use illicit drugs.  Review of Systems    She has had periodic low back/left buttock pain.  She had L2-3 degenerative disc disease on her spine x-ray.   She will occasionally take Celebrex as needed with relief, less recently     Has osteopenia with T score on the femoral neck -1.5 in 9/08.  Original height 5'5". Has been prescribed Evista and also tried on Actonel but did not continue it. No recent bone density done     She has had periodic rash and stinging on her right buttock area and she thinks this is shingles; it may have initially been diagnosed by a dermatologist. She thinks she has some bumps that come in a line. She thinks Zovirax helps treat this and is reluctant to take Zostavax injection today   Preventive care: She still has not scheduled a mammogram as she does not like the facility    EXAM:  BP 134/87 mmHg  Pulse 73  Temp(Src) 98 F (36.7 C)  Resp 14  Ht 5\' 3"  (1.6 m)  Wt 187 lb 6.4 oz (85.004 kg)  BMI 33.20 kg/m2  SpO2 97%     Assessment/Plan:   REFLUX: She will try OTC Prilosec   Primary hypothyroidism: Check TSH today  Hyperlipidemia: Controlled on diet alone. Continue low saturated fat diet    Hypertension: blood pressure is higher and she will go up to the whole tablet of 10 mg Norvasc.  She can also check blood pressure periodically at home She will follow-up for blood pressure again short-term  Anxiety: She will call if she wants pharmacological treatment for this or insomnia. She says she wants to try meditation and encouraged her to do so  PREVENTIVE care:  Will refer her for mammogram close to her home along with bone density Encouraged her to start walking regularly   [facility] 04/02/2014, 11:45 AM   Addendum: Labs as follows, Has only mild increase in lipids  otherwise all normal  Office Visit on 04/02/2014  Component Date Value Ref Range Status  . Sodium 04/02/2014 139  135 - 145 mEq/L Final  . Potassium 04/02/2014 3.9  3.5 - 5.1 mEq/L Final  . Chloride 04/02/2014 102  96 - 112 mEq/L Final  . CO2  04/02/2014 25  19 - 32 mEq/L Final  . Glucose, Bld 04/02/2014 83  70 - 99 mg/dL Final  . BUN 04/02/2014 13  6 - 23 mg/dL Final  . Creatinine, Ser 04/02/2014 0.8  0.4 - 1.2 mg/dL Final  . Total Bilirubin 04/02/2014 0.5  0.2 - 1.2 mg/dL Final  . Alkaline Phosphatase 04/02/2014 71  39 - 117 U/L Final  . AST 04/02/2014 23  0 - 37 U/L Final  . ALT 04/02/2014 17  0 - 35 U/L Final  . Total Protein 04/02/2014 7.6  6.0 - 8.3 g/dL Final  . Albumin 04/02/2014 4.6  3.5 - 5.2 g/dL Final  . Calcium 04/02/2014 9.5  8.4 - 10.5 mg/dL Final  . GFR 04/02/2014 79.19  >60.00 mL/min Final  . Cholesterol 04/02/2014 218* 0 - 200 mg/dL Final   ATP III Classification       Desirable:  < 200 mg/dL               Borderline High:  200 - 239 mg/dL          High:  > = 240 mg/dL  . Triglycerides 04/02/2014 124.0  0.0 - 149.0 mg/dL Final   Normal:  <150 mg/dLBorderline High:  150 - 199 mg/dL  . HDL 04/02/2014 73.80  >39.00 mg/dL Final  . VLDL 04/02/2014 24.8  0.0 - 40.0 mg/dL Final  . LDL Cholesterol 04/02/2014 119* 0 - 99 mg/dL Final  . Total CHOL/HDL Ratio 04/02/2014 3   Final                  Men          Women1/2 Average Risk     3.4          3.3Average Risk          5.0          4.42X Average Risk          9.6          7.13X Average Risk          15.0          11.0                      . NonHDL 04/02/2014 144.20   Final   NOTE:  Non-HDL goal should be 30 mg/dL higher than patient's LDL goal (i.e. LDL goal of < 70 mg/dL, would have non-HDL goal of < 100 mg/dL)  . TSH 04/02/2014 3.11  0.35 - 4.50 uIU/mL Final  . Free T4 04/02/2014 0.92  0.60 - 1.60 ng/dL Final

## 2014-04-02 NOTE — Patient Instructions (Addendum)
Take full tablet of amlodipine

## 2014-04-03 LAB — COMPREHENSIVE METABOLIC PANEL
ALBUMIN: 4.6 g/dL (ref 3.5–5.2)
ALT: 17 U/L (ref 0–35)
AST: 23 U/L (ref 0–37)
Alkaline Phosphatase: 71 U/L (ref 39–117)
BUN: 13 mg/dL (ref 6–23)
CALCIUM: 9.5 mg/dL (ref 8.4–10.5)
CHLORIDE: 102 meq/L (ref 96–112)
CO2: 25 meq/L (ref 19–32)
Creatinine, Ser: 0.8 mg/dL (ref 0.4–1.2)
GFR: 79.19 mL/min (ref >60.00)
GLUCOSE: 83 mg/dL (ref 70–99)
Potassium: 3.9 mEq/L (ref 3.5–5.1)
SODIUM: 139 meq/L (ref 135–145)
Total Bilirubin: 0.5 mg/dL (ref 0.2–1.2)
Total Protein: 7.6 g/dL (ref 6.0–8.3)

## 2014-04-03 LAB — LIPID PANEL
CHOLESTEROL: 218 mg/dL — AB (ref 0–200)
HDL: 73.8 mg/dL (ref >39.00)
LDL Cholesterol: 119 mg/dL — ABNORMAL HIGH (ref 0–99)
NonHDL: 144.2
Total CHOL/HDL Ratio: 3
Triglycerides: 124 mg/dL (ref 0.0–149.0)
VLDL: 24.8 mg/dL (ref 0.0–40.0)

## 2014-04-03 NOTE — Progress Notes (Signed)
Quick Note:  Please let patient know that the thyroid result is normal, cholesterol is slightly higher than before and needs to be consistent on diet and exercise, glucose okay ______

## 2014-04-18 ENCOUNTER — Telehealth: Payer: Self-pay | Admitting: Endocrinology

## 2014-04-18 ENCOUNTER — Other Ambulatory Visit: Payer: Self-pay | Admitting: *Deleted

## 2014-04-18 MED ORDER — AMLODIPINE BESYLATE 10 MG PO TABS: 10.0000 mg | ORAL_TABLET | Freq: Every day | ORAL | Status: DC

## 2014-04-18 NOTE — Telephone Encounter (Signed)
Patient stated that med need to called in for a whole pill so it can last 60 day instead of 30 days.  So a new prescription need to be called in call into [facility] on [address]

## 2014-05-15 ENCOUNTER — Ambulatory Visit (INDEPENDENT_AMBULATORY_CARE_PROVIDER_SITE_OTHER): Payer: Commercial Indemnity | Admitting: Endocrinology

## 2014-05-15 ENCOUNTER — Encounter: Payer: Self-pay | Admitting: Endocrinology

## 2014-05-15 VITALS — BP 114/70 | HR 72 | Temp 98.1°F | Resp 14 | Ht 63.0 in | Wt 186.2 lb

## 2014-05-15 DIAGNOSIS — E063 Autoimmune thyroiditis: Secondary | ICD-10-CM

## 2014-05-15 DIAGNOSIS — E785 Hyperlipidemia, unspecified: Secondary | ICD-10-CM

## 2014-05-15 DIAGNOSIS — M8580 Other specified disorders of bone density and structure, unspecified site: Secondary | ICD-10-CM

## 2014-05-15 DIAGNOSIS — I10 Essential (primary) hypertension: Secondary | ICD-10-CM

## 2014-05-15 DIAGNOSIS — E038 Other specified hypothyroidism: Secondary | ICD-10-CM

## 2014-05-15 NOTE — Patient Instructions (Signed)
Schedule mammogram.

## 2014-05-15 NOTE — Progress Notes (Signed)
Patient ID: Alexis Beard, female   DOB: 1945-06-09, 68 y.o.   MRN: [id number]   Chief complaint:  Followup of multiple problems  History of Present Illness:    Hypertension: she has had long-standing mild hypertension which was previously controlled on 5 mg Norvasc.  This was increased to 10 mg in 11/15 because of diastolic reading of 87  She is having less stress now since she sold her home.    She has had hypothyroidism since about 1995 and previously had been on initial dose of 88 mcg. Several years ago was taking 112 mcg.Marland Kitchen Her TSH was higher in 12/14 and her dose was increased to 100 mcg.  However because of relatively low TSH the dose was reduced back to 88 g in 7/15 but she has not had a follow-up She feels fairly good without any unusual fatigue or significant weight change. She has been taking her Synthroid consistently in the mornings.   Lab Results  Component Value Date   TSH 3.11 04/02/2014    Her cholesterol has been periodically high when she does not watch her desserts and meats.  Usually her LDL has been near 100 in the past and has not been on any medications. She is generally able to be relatively good with her diet and increasing her vegetables and less processed foods but her LDL was 119 recently   Lab Results  Component Value Date   CHOL 218* 04/02/2014   HDL 73.80 04/02/2014   LDLCALC 119* 04/02/2014   LDLDIRECT 135.4 05/02/2013   TRIG 124.0 04/02/2014   CHOLHDL 3 04/02/2014      She has a history of prediabetes and her glucose was last 90 with normal A1c. She has been irregular with her walking recently.   Wt Readings from Last 3 Encounters:  05/15/14 186 lb 3.2 oz (84.46 kg)  04/02/14 187 lb 6.4 oz (85.004 kg)  12/07/13 186 lb 12.8 oz (84.732 kg)    Lab Results  Component Value Date   HGBA1C 5.7 12/07/2013   HGBA1C 5.8 05/02/2013   Lab Results  Component Value Date   LDLCALC 119* 04/02/2014   CREATININE 0.8 04/02/2014      Vitamin D  deficiency: She has been prescribed  50,000 units and  has taken it once a month as directed. Her last level was 40.         Medication List       This list is accurate as of: 05/15/14 10:42 AM.  Always use your most recent med list.               amLODipine 10 MG tablet  Commonly known as:  NORVASC  Take 1 tablet (10 mg total) by mouth daily.     aspirin-sod bicarb-citric acid 325 MG Tbef tablet  Commonly known as:  ALKA-SELTZER  Take 325 mg by mouth every 6 (six) hours as needed.     celecoxib 200 MG capsule  Commonly known as:  CELEBREX  Take 1 capsule (200 mg total) by mouth daily.     levothyroxine 88 MCG tablet  Commonly known as:  SYNTHROID, LEVOTHROID  Take 1 tablet (88 mcg total) by mouth daily.     Vitamin D (Ergocalciferol) 50000 UNITS Caps capsule  Commonly known as:  DRISDOL  Take 50,000 Units by mouth every 7 (seven) days.        Allergies:  Allergies  Allergen Reactions  . Codeine Nausea And Vomiting    Past Medical History  Diagnosis Date  . Hypertension   . Hypothyroid   . Arthritis   . Cancer     Basal cell Carcinoma    Past Surgical History  Procedure Laterality Date  . Carpal tunnel release    . Birth mark removal      on (R) side of face    Family History  Problem Relation Age of Onset  . Diabetes Mother   . Thyroid disease Mother   . Hyperlipidemia Father   . Heart disease Father   . Hyperlipidemia Sister   . Cancer Maternal Aunt     Breast Cancer  . Thyroid disease Maternal Aunt     Social History:  reports that she has never smoked. She has never used smokeless tobacco. She reports that she does not drink alcohol or use illicit drugs.  Review of Systems    She has had periodic low back/left buttock pain.  She had L2-3 degenerative disc disease on her spine x-ray.   She will occasionally take Celebrex as needed with relief, none recently     Has osteopenia with T score on the femoral neck -1.5 in 9/08.  Original  height 5'5". Has been prescribed Evista and also tried on Actonel but did not continue it. Bone density to be done    Preventive care: She still has not scheduled a mammogram and this will be scheduled along with bone density    EXAM:  BP 114/70 mmHg  Pulse 72  Temp(Src) 98.1 F (36.7 C)  Resp 14  Ht 5\' 3"  (1.6 m)  Wt 186 lb 3.2 oz (84.46 kg)  BMI 32.99 kg/m2  SpO2 96%     Assessment/Plan:   Hyperlipidemia: Controlled on diet alone. Continue low saturated fat diet    Hypertension: blood pressure is better controlled with 10 mg of Norvasc She will try to check at home also  PREVENTIVE care:  Will refer her for mammogram  along with bone density   [name] 05/15/2014, 10:42 AM

## 2014-05-30 ENCOUNTER — Telehealth: Payer: Self-pay | Admitting: Endocrinology

## 2014-05-30 ENCOUNTER — Other Ambulatory Visit: Payer: Self-pay | Admitting: *Deleted

## 2014-05-30 MED ORDER — VITAMIN D (ERGOCALCIFEROL) 1.25 MG (50000 UNIT) PO CAPS: 50000.0000 [IU] | ORAL_CAPSULE | ORAL | Status: DC

## 2014-05-30 NOTE — Telephone Encounter (Signed)
Please call in vit d 1000 u 1 capsule a week. Please call this into [facility] on [address]

## 2014-05-30 NOTE — Telephone Encounter (Signed)
rx sent

## 2014-06-25 ENCOUNTER — Other Ambulatory Visit: Payer: Self-pay | Admitting: *Deleted

## 2014-06-25 MED ORDER — LEVOTHYROXINE SODIUM 88 MCG PO TABS: 88.0000 ug | ORAL_TABLET | Freq: Every day | ORAL | Status: DC

## 2014-08-06 ENCOUNTER — Encounter: Payer: Self-pay | Admitting: Endocrinology

## 2014-09-10 ENCOUNTER — Other Ambulatory Visit (INDEPENDENT_AMBULATORY_CARE_PROVIDER_SITE_OTHER): Payer: Commercial Indemnity

## 2014-09-10 DIAGNOSIS — E785 Hyperlipidemia, unspecified: Secondary | ICD-10-CM | POA: Diagnosis not present

## 2014-09-10 DIAGNOSIS — I10 Essential (primary) hypertension: Secondary | ICD-10-CM

## 2014-09-10 DIAGNOSIS — E063 Autoimmune thyroiditis: Secondary | ICD-10-CM

## 2014-09-10 DIAGNOSIS — E038 Other specified hypothyroidism: Secondary | ICD-10-CM

## 2014-09-10 LAB — LIPID PANEL
Cholesterol: 187 mg/dL (ref 0–200)
HDL: 74 mg/dL (ref >39.00)
LDL Cholesterol: 95 mg/dL (ref 0–99)
NonHDL: 113
Total CHOL/HDL Ratio: 3
Triglycerides: 90 mg/dL (ref 0.0–149.0)
VLDL: 18 mg/dL (ref 0.0–40.0)

## 2014-09-10 LAB — COMPREHENSIVE METABOLIC PANEL
ALT: 13 U/L (ref 0–35)
AST: 17 U/L (ref 0–37)
Albumin: 4.4 g/dL (ref 3.5–5.2)
Alkaline Phosphatase: 77 U/L (ref 39–117)
BILIRUBIN TOTAL: 0.5 mg/dL (ref 0.2–1.2)
BUN: 14 mg/dL (ref 6–23)
CO2: 31 mEq/L (ref 19–32)
CREATININE: 0.74 mg/dL (ref 0.40–1.20)
Calcium: 9.7 mg/dL (ref 8.4–10.5)
Chloride: 101 mEq/L (ref 96–112)
GFR: 82.8 mL/min (ref >60.00)
Glucose, Bld: 94 mg/dL (ref 70–99)
Potassium: 4.1 mEq/L (ref 3.5–5.1)
SODIUM: 138 meq/L (ref 135–145)
Total Protein: 7.3 g/dL (ref 6.0–8.3)

## 2014-09-10 LAB — TSH: TSH: 5.52 u[IU]/mL — ABNORMAL HIGH (ref 0.35–4.50)

## 2014-09-13 ENCOUNTER — Encounter: Payer: Self-pay | Admitting: Endocrinology

## 2014-09-13 ENCOUNTER — Ambulatory Visit (INDEPENDENT_AMBULATORY_CARE_PROVIDER_SITE_OTHER): Payer: Commercial Indemnity | Admitting: Endocrinology

## 2014-09-13 ENCOUNTER — Other Ambulatory Visit: Payer: Self-pay | Admitting: *Deleted

## 2014-09-13 VITALS — BP 134/90 | HR 76 | Temp 98.0°F | Resp 14 | Ht 63.0 in | Wt 184.2 lb

## 2014-09-13 DIAGNOSIS — M810 Age-related osteoporosis without current pathological fracture: Secondary | ICD-10-CM | POA: Diagnosis not present

## 2014-09-13 DIAGNOSIS — I10 Essential (primary) hypertension: Secondary | ICD-10-CM

## 2014-09-13 DIAGNOSIS — E038 Other specified hypothyroidism: Secondary | ICD-10-CM | POA: Diagnosis not present

## 2014-09-13 DIAGNOSIS — E063 Autoimmune thyroiditis: Secondary | ICD-10-CM

## 2014-09-13 MED ORDER — RAMIPRIL 2.5 MG PO CAPS
2.5000 mg | ORAL_CAPSULE | Freq: Every day | ORAL | Status: DC
Start: 2014-09-13 — End: 2016-12-20

## 2014-09-13 MED ORDER — RALOXIFENE HCL 60 MG PO TABS: 60.0000 mg | ORAL_TABLET | Freq: Every day | ORAL | Status: DC

## 2014-09-13 MED ORDER — LEVOTHYROXINE SODIUM 100 MCG PO TABS: 100.0000 ug | ORAL_TABLET | Freq: Every day | ORAL | Status: DC

## 2014-09-13 NOTE — Progress Notes (Signed)
Patient ID: Alexis Beard, female   DOB: Apr 27, 1946, 69 y.o.   MRN: [id number]   Chief complaint:  Followup of multiple problems  History of Present Illness:    Hypertension: she has had long-standing mild hypertension.  Because of high blood pressure is Norvasc was increased to 10 mg in 11/15 because of diastolic reading of 87.  She does not monitor at home and blood pressure appears to be higher than usual, BP on her last visit was 110/70.  She says for the last week she will feel a little woozy for a couple of seconds and will have to hold on for something.  She thinks this is not present when she does not drink coffee    She has had HYPOTHYROIDISM since about 1995 and previously had been a dose of 88 mcg. Several years ago was taking 112 mcg.. Because of relatively low TSH the dose was reduced back to 88 g in 7/15.  She feels more tired in the last couple of weeks or so She has been taking her Synthroid consistently in the mornings.   Lab Results  Component Value Date   TSH 5.52* 09/10/2014   TSH 3.11 04/02/2014   TSH 0.28* 12/07/2013   FREET4 0.92 04/02/2014   FREET4 1.13 12/07/2013   FREET4 1.04 08/02/2013     Her cholesterol has been periodically high when she does not watch her desserts and meats.  Usually her LDL has been near 100 in the past and has not been on any medications. She is generally able to be relatively good with her diet and increasing her vegetables and less processed foods and her LDL is back under 100 again   Lab Results  Component Value Date   CHOL 187 09/10/2014   HDL 74.00 09/10/2014   LDLCALC 95 09/10/2014   LDLDIRECT 135.4 05/02/2013   TRIG 90.0 09/10/2014   CHOLHDL 3 09/10/2014      She has a history of prediabetes but blood sugar has been very consistently normal in 2015   Wt Readings from Last 3 Encounters:  09/13/14 184 lb 3.2 oz (83.553 kg)  05/15/14 186 lb 3.2 oz (84.46 kg)  04/02/14 187 lb 6.4 oz (85.004 kg)    Lab Results   Component Value Date   HGBA1C 5.7 12/07/2013   HGBA1C 5.8 05/02/2013   Lab Results  Component Value Date   LDLCALC 95 09/10/2014   CREATININE 0.74 09/10/2014      OSTEOPOROSIS: See review of systems   Vitamin D deficiency: She has been prescribed  50,000 units and  has taken it once a month as directed. Her last level was 40.        Medication List       This list is accurate as of: 09/13/14 10:53 AM.  Always use your most recent med list.               amLODipine 10 MG tablet  Commonly known as:  NORVASC  Take 1 tablet (10 mg total) by mouth daily.     aspirin-sod bicarb-citric acid 325 MG Tbef tablet  Commonly known as:  ALKA-SELTZER  Take 325 mg by mouth every 6 (six) hours as needed.     celecoxib 200 MG capsule  Commonly known as:  CELEBREX  Take 1 capsule (200 mg total) by mouth daily.     levothyroxine 88 MCG tablet  Commonly known as:  SYNTHROID, LEVOTHROID  Take 1 tablet (88 mcg total) by mouth daily.  Vitamin D (Ergocalciferol) 50000 UNITS Caps capsule  Commonly known as:  DRISDOL  Take 1 capsule (50,000 Units total) by mouth every 7 (seven) days.        Allergies:  Allergies  Allergen Reactions  . Codeine Nausea And Vomiting    Past Medical History  Diagnosis Date  . Hypertension   . Hypothyroid   . Arthritis   . Cancer     Basal cell Carcinoma    Past Surgical History  Procedure Laterality Date  . Carpal tunnel release    . Birth mark removal      on (R) side of face    Family History  Problem Relation Age of Onset  . Diabetes Mother   . Thyroid disease Mother   . Hyperlipidemia Father   . Heart disease Father   . Hyperlipidemia Sister   . Cancer Maternal Aunt     Breast Cancer  . Thyroid disease Maternal Aunt     Social History:  reports that she has never smoked. She has never used smokeless tobacco. She reports that she does not drink alcohol or use illicit drugs.  Review of Systems    She has had periodic  low back/left buttock pain.  She had L2-3 degenerative disc disease on her spine x-ray.   She will occasionally take Celebrex as needed with relief, none recently     Has osteopenia with T score on the femoral neck -1.5 in 9/08.  Original height 5'5".  Follow-up bone density shows T score of the right femur to be -2.1 and at the left it is -1.6 T score at the wrist is -3.2, previously - 3.6 Has been prescribed Evista previously and also tried on Actonel but did not continue medications for unknown reasons   Preventive care: She had a normal mammogram   EXAM:  BP 134/90 mmHg  Pulse 76  Temp(Src) 98 F (36.7 C)  Resp 14  Ht 5\' 3"  (1.6 m)  Wt 184 lb 3.2 oz (83.553 kg)  BMI 32.64 kg/m2  SpO2 96%   Repeat blood pressure 130/86 left arm and 132/82 standing right arm  Assessment/Plan:   HYPOTHYROIDISM: She is complaining of some fatigue and her TSH is high.  She will change her dose to 100 g and follow-up in 6 weeks Low HYPERTENSION: blood pressure is significantly higher today.  Not clear if she is having nonspecific dizziness from this also Since she is already on 10 mg Norvasc will add 2.5 mg ramipril She will try to check at home also  OSTEOPOROSIS: Since she has a significantly low bone density at the wrist and is not on any treatment will start her on Evista which she had taken before.  She will try to take this every other day for the first week to prevent hot flashes   Hyperlipidemia: Controlled on diet alone. Continue low saturated fat diet    History of impaired fasting glucose: We will recheck on the next visit  Alexis Beard 09/13/2014, 10:53 AM

## 2014-10-08 ENCOUNTER — Encounter: Payer: Self-pay | Admitting: Endocrinology

## 2014-10-13 ENCOUNTER — Other Ambulatory Visit: Payer: Self-pay | Admitting: Endocrinology

## 2014-10-22 ENCOUNTER — Other Ambulatory Visit (INDEPENDENT_AMBULATORY_CARE_PROVIDER_SITE_OTHER): Payer: BLUE CROSS/BLUE SHIELD

## 2014-10-22 DIAGNOSIS — I10 Essential (primary) hypertension: Secondary | ICD-10-CM

## 2014-10-22 DIAGNOSIS — E038 Other specified hypothyroidism: Secondary | ICD-10-CM

## 2014-10-22 DIAGNOSIS — E063 Autoimmune thyroiditis: Secondary | ICD-10-CM

## 2014-10-22 LAB — BASIC METABOLIC PANEL
BUN: 13 mg/dL (ref 6–23)
CO2: 29 mEq/L (ref 19–32)
CREATININE: 0.75 mg/dL (ref 0.40–1.20)
Calcium: 10 mg/dL (ref 8.4–10.5)
Chloride: 102 mEq/L (ref 96–112)
GFR: 81.5 mL/min (ref >60.00)
Glucose, Bld: 91 mg/dL (ref 70–99)
Potassium: 3.7 mEq/L (ref 3.5–5.1)
SODIUM: 138 meq/L (ref 135–145)

## 2014-10-22 LAB — TSH: TSH: 2.23 u[IU]/mL (ref 0.35–4.50)

## 2014-10-25 ENCOUNTER — Ambulatory Visit (INDEPENDENT_AMBULATORY_CARE_PROVIDER_SITE_OTHER): Payer: BLUE CROSS/BLUE SHIELD | Admitting: Endocrinology

## 2014-10-25 ENCOUNTER — Encounter: Payer: Self-pay | Admitting: Endocrinology

## 2014-10-25 VITALS — BP 126/78 | HR 73 | Temp 98.0°F | Resp 16 | Ht 63.0 in | Wt 184.2 lb

## 2014-10-25 DIAGNOSIS — E785 Hyperlipidemia, unspecified: Secondary | ICD-10-CM

## 2014-10-25 DIAGNOSIS — M8580 Other specified disorders of bone density and structure, unspecified site: Secondary | ICD-10-CM

## 2014-10-25 DIAGNOSIS — E038 Other specified hypothyroidism: Secondary | ICD-10-CM

## 2014-10-25 DIAGNOSIS — R7303 Prediabetes: Secondary | ICD-10-CM

## 2014-10-25 DIAGNOSIS — I10 Essential (primary) hypertension: Secondary | ICD-10-CM | POA: Diagnosis not present

## 2014-10-25 DIAGNOSIS — R7309 Other abnormal glucose: Secondary | ICD-10-CM

## 2014-10-25 DIAGNOSIS — E063 Autoimmune thyroiditis: Secondary | ICD-10-CM

## 2014-10-25 NOTE — Progress Notes (Signed)
Patient ID: Alexis Beard, female   DOB: 1946-02-22, 69 y.o.   MRN: [id number]   Chief complaint:  Followup of multiple problems  History of Present Illness:    Hypertension: she has had long-standing mild hypertension.  Since 11/15 appears to be requiring increasing medications for control.  In 4/16 because of high blood pressure and symptoms of dizziness she was given ramipril 2.5 in addition to the 10 mg Norvasc that she had been taking Blood pressure appears to be improved at home also and she has checked some readings now : Recent readings 127-78-130/80     She has had HYPOTHYROIDISM since about 1995 and previously had been a dose of 88 mcg. Several years ago was taking 112 mcg.. Because of relatively low TSH the dose was reduced back to 88 g in 7/15. TSH was again high in 4/16 and she is now taking 100 g daily with improvement in her fatigue She has been taking her Synthroid consistently in the mornings.  TSH is back to normal  Lab Results  Component Value Date   TSH 2.23 10/22/2014   TSH 5.52* 09/10/2014   TSH 3.11 04/02/2014   FREET4 0.92 04/02/2014   FREET4 1.13 12/07/2013   FREET4 1.04 08/02/2013     Her cholesterol has been periodically high when she does not watch her desserts and meats.  Usually her LDL has been near 100 in the past and has not been on any medications. She is generally able to be compliant with her diet and increasing her vegetables and less processed foods and her LDL is back under 100 again   Lab Results  Component Value Date   CHOL 187 09/10/2014   HDL 74.00 09/10/2014   LDLCALC 95 09/10/2014   LDLDIRECT 135.4 05/02/2013   TRIG 90.0 09/10/2014   CHOLHDL 3 09/10/2014      She has a history of prediabetes but blood sugar has been very consistently normal including recently.  She has been trying to walk but has difficulty losing weight.  Previous A1c has been quite normal   Wt Readings from Last 3 Encounters:  10/25/14 184 lb  3.2 oz (83.553 kg)  09/13/14 184 lb 3.2 oz (83.553 kg)  05/15/14 186 lb 3.2 oz (84.46 kg)    Lab Results  Component Value Date   HGBA1C 5.7 12/07/2013   HGBA1C 5.8 05/02/2013   Lab Results  Component Value Date   LDLCALC 95 09/10/2014   CREATININE 0.75 10/22/2014      OSTEOPOROSIS:   See review of systems   Vitamin D deficiency: She has been prescribed  50,000 units and  has taken it once a month as directed. Her last level was 40.        Medication List       This list is accurate as of: 10/25/14 11:59 PM.  Always use your most recent med list.               amLODipine 10 MG tablet  Commonly known as:  NORVASC  TAKE 1 TABLET BY MOUTH EVERY DAY     aspirin-sod bicarb-citric acid 325 MG Tbef tablet  Commonly known as:  ALKA-SELTZER  Take 325 mg by mouth every 6 (six) hours as needed.     levothyroxine 100 MCG tablet  Commonly known as:  SYNTHROID, LEVOTHROID  Take 1 tablet (100 mcg total) by mouth daily.     raloxifene 60 MG tablet  Commonly known as:  EVISTA  Take  1 tablet (60 mg total) by mouth daily.     ramipril 2.5 MG capsule  Commonly known as:  ALTACE  Take 1 capsule (2.5 mg total) by mouth daily.     Vitamin D (Ergocalciferol) 50000 UNITS Caps capsule  Commonly known as:  DRISDOL  Take 1 capsule (50,000 Units total) by mouth every 7 (seven) days.        Allergies:  Allergies  Allergen Reactions  . Codeine Nausea And Vomiting    Past Medical History  Diagnosis Date  . Hypertension   . Hypothyroid   . Arthritis   . Cancer     Basal cell Carcinoma    Past Surgical History  Procedure Laterality Date  . Carpal tunnel release    . Birth mark removal      on (R) side of face    Family History  Problem Relation Age of Onset  . Diabetes Mother   . Thyroid disease Mother   . Hyperlipidemia Father   . Heart disease Father   . Hyperlipidemia Sister   . Cancer Maternal Aunt     Breast Cancer  . Thyroid disease Maternal Aunt      Social History:  reports that she has never smoked. She has never used smokeless tobacco. She reports that she does not drink alcohol or use illicit drugs.  Review of Systems   Back pain:   She had L2-3 degenerative disc disease on her spine x-ray.   She will occasionally take Celebrex as needed for low back pain with relief, none recently     Has OSTEOPENIA with T score on the femoral neck -1.5 in 9/08.  Original height 5'5".  Follow-up bone density shows T score of the right femur to be -2.1 and at the left it is -1.6 T score at the wrist is -3.2, previously - 3.6 Has been prescribed Evista and on her last visit in 4/16 was asked to start this but she still has not done so     EXAM:  BP 126/78 mmHg  Pulse 73  Temp(Src) 98 F (36.7 C)  Resp 16  Ht 5\' 3"  (1.6 m)  Wt 184 lb 3.2 oz (83.553 kg)  BMI 32.64 kg/m2  SpO2 98%    Assessment/Plan:   HYPOTHYROIDISM: She is having less fatigue with increasing her dose to 100 g and TSH is normal now  HYPERTENSION: blood pressure is improved significantly with adding ramipril 2.5 mg to her Norvasc 10 mg She'll continue the same dose of medications.  She will try to check at home also as before  OSTEOPOROSIS: Since she has a significantly low bone density at the wrist and is not on any treatment  Reminded her to start her on Evista which she had taken before.  She will try to take this every other day for the first week to prevent hot flashes   Hyperlipidemia: Controlled on diet alone. Continue low saturated fat diet    History of impaired fasting glucose: Fasting glucose normal, needs continued lifestyle changes   Alexis Beard 10/27/2014, 10:05 AM   Note: This office note was prepared with Estate agent. Any transcriptional errors that result from this process are unintentional.

## 2014-12-06 ENCOUNTER — Other Ambulatory Visit: Payer: Self-pay | Admitting: *Deleted

## 2014-12-06 MED ORDER — VITAMIN D (ERGOCALCIFEROL) 1.25 MG (50000 UNIT) PO CAPS: 50000.0000 [IU] | ORAL_CAPSULE | ORAL | Status: DC

## 2015-01-07 ENCOUNTER — Other Ambulatory Visit: Payer: Self-pay | Admitting: *Deleted

## 2015-01-07 MED ORDER — LEVOTHYROXINE SODIUM 100 MCG PO TABS: 100.0000 ug | ORAL_TABLET | Freq: Every day | ORAL | Status: DC

## 2015-02-19 ENCOUNTER — Other Ambulatory Visit (INDEPENDENT_AMBULATORY_CARE_PROVIDER_SITE_OTHER): Payer: BLUE CROSS/BLUE SHIELD

## 2015-02-19 DIAGNOSIS — R7303 Prediabetes: Secondary | ICD-10-CM | POA: Diagnosis not present

## 2015-02-19 DIAGNOSIS — I10 Essential (primary) hypertension: Secondary | ICD-10-CM

## 2015-02-19 DIAGNOSIS — E785 Hyperlipidemia, unspecified: Secondary | ICD-10-CM

## 2015-02-19 LAB — COMPREHENSIVE METABOLIC PANEL
ALT: 14 U/L (ref 0–35)
AST: 15 U/L (ref 0–37)
Albumin: 4.2 g/dL (ref 3.5–5.2)
Alkaline Phosphatase: 77 U/L (ref 39–117)
BILIRUBIN TOTAL: 0.6 mg/dL (ref 0.2–1.2)
BUN: 14 mg/dL (ref 6–23)
CALCIUM: 9.6 mg/dL (ref 8.4–10.5)
CO2: 32 meq/L (ref 19–32)
Chloride: 101 mEq/L (ref 96–112)
Creatinine, Ser: 0.79 mg/dL (ref 0.40–1.20)
GFR: 76.68 mL/min (ref >60.00)
Glucose, Bld: 94 mg/dL (ref 70–99)
Potassium: 4 mEq/L (ref 3.5–5.1)
SODIUM: 140 meq/L (ref 135–145)
Total Protein: 7.4 g/dL (ref 6.0–8.3)

## 2015-02-19 LAB — LIPID PANEL
CHOL/HDL RATIO: 3
Cholesterol: 187 mg/dL (ref 0–200)
HDL: 70.5 mg/dL (ref >39.00)
LDL Cholesterol: 95 mg/dL (ref 0–99)
NonHDL: 116.01
TRIGLYCERIDES: 105 mg/dL (ref 0.0–149.0)
VLDL: 21 mg/dL (ref 0.0–40.0)

## 2015-02-19 LAB — HEMOGLOBIN A1C: Hgb A1c MFr Bld: 5.7 % (ref 4.6–6.5)

## 2015-02-24 ENCOUNTER — Ambulatory Visit (INDEPENDENT_AMBULATORY_CARE_PROVIDER_SITE_OTHER): Payer: BLUE CROSS/BLUE SHIELD | Admitting: Endocrinology

## 2015-02-24 ENCOUNTER — Encounter: Payer: Self-pay | Admitting: Endocrinology

## 2015-02-24 VITALS — BP 124/82 | HR 72 | Temp 98.4°F | Resp 16 | Ht 63.0 in | Wt 186.6 lb

## 2015-02-24 DIAGNOSIS — M810 Age-related osteoporosis without current pathological fracture: Secondary | ICD-10-CM | POA: Diagnosis not present

## 2015-02-24 DIAGNOSIS — E038 Other specified hypothyroidism: Secondary | ICD-10-CM

## 2015-02-24 DIAGNOSIS — I10 Essential (primary) hypertension: Secondary | ICD-10-CM | POA: Diagnosis not present

## 2015-02-24 DIAGNOSIS — E063 Autoimmune thyroiditis: Secondary | ICD-10-CM

## 2015-02-24 NOTE — Progress Notes (Signed)
Patient ID: Alexis Beard, female   DOB: 1945-11-12, 69 y.o.   MRN: [id number]   Chief complaint:  Followup of multiple problems  History of Present Illness:    Hypertension: she has had long-standing mild hypertension.     In 4/16 because of high blood pressure and symptoms of dizziness she was given ramipril 2.5 in addition to the 10 mg Norvasc that she had been taking. Blood pressure appears to be improved.  She also occasionally checks blood pressure elsewhere and readings are usually fairly good.  Today he is somewhat anxious. However she does say that occasionally she may be irregular with her ramipril     She has had HYPOTHYROIDISM since about 1995 and previously had been a dose of 88 mcg. Several years ago was taking 112 mcg.. Because of relatively low TSH the dose was reduced back to 88 g in 7/15. TSH was high in 4/16 and she is since then taking 100 g daily with improvement in her fatigue She has been taking her Synthroid consistently in the mornings.  No recent fatigue. TSH is back to normal  Lab Results  Component Value Date   TSH 2.23 10/22/2014   TSH 5.52* 09/10/2014   TSH 3.11 04/02/2014   FREET4 0.92 04/02/2014   FREET4 1.13 12/07/2013   FREET4 1.04 08/02/2013     Her cholesterol has been periodically high when she does not watch her desserts and meats.  Usually her LDL has been near 100 in the past and has not been on any medications. She is generally compliant with her diet and increasing her vegetables and less processed foods and her LDL is under 100 again   Lab Results  Component Value Date   CHOL 187 02/19/2015   HDL 70.50 02/19/2015   Arena 95 02/19/2015   LDLDIRECT 135.4 05/02/2013   TRIG 105.0 02/19/2015   CHOLHDL 3 02/19/2015      She has a history of prediabetes but blood sugar has been very consistently normal including recently.  She has been trying to walk but has difficulty losing weight.  A1c has been quite normal   Wt  Readings from Last 3 Encounters:  02/24/15 186 lb 9.6 oz (84.641 kg)  10/25/14 184 lb 3.2 oz (83.553 kg)  09/13/14 184 lb 3.2 oz (83.553 kg)    Lab Results  Component Value Date   HGBA1C 5.7 02/19/2015   HGBA1C 5.7 12/07/2013   HGBA1C 5.8 05/02/2013   Lab Results  Component Value Date   LDLCALC 95 02/19/2015   CREATININE 0.79 02/19/2015      OSTEOPOROSIS:   See review of systems   Vitamin D deficiency: She has been prescribed  50,000 units and  has taken it once a month as directed. Her last level was 40.        Medication List       This list is accurate as of: 02/24/15  1:26 PM.  Always use your most recent med list.               amLODipine 10 MG tablet  Commonly known as:  NORVASC  TAKE 1 TABLET BY MOUTH EVERY DAY     aspirin-sod bicarb-citric acid 325 MG Tbef tablet  Commonly known as:  ALKA-SELTZER  Take 325 mg by mouth every 6 (six) hours as needed.     levothyroxine 100 MCG tablet  Commonly known as:  SYNTHROID, LEVOTHROID  Take 1 tablet (100 mcg total) by mouth daily.  raloxifene 60 MG tablet  Commonly known as:  EVISTA  Take 1 tablet (60 mg total) by mouth daily.     ramipril 2.5 MG capsule  Commonly known as:  ALTACE  Take 1 capsule (2.5 mg total) by mouth daily.     Vitamin D (Ergocalciferol) 50000 UNITS Caps capsule  Commonly known as:  DRISDOL  Take 1 capsule (50,000 Units total) by mouth every 7 (seven) days.        Allergies:  Allergies  Allergen Reactions  . Codeine Nausea And Vomiting    Past Medical History  Diagnosis Date  . Hypertension   . Hypothyroid   . Arthritis   . Cancer [facility])     Basal cell Carcinoma    Past Surgical History  Procedure Laterality Date  . Carpal tunnel release    . Birth mark removal      on (R) side of face    Family History  Problem Relation Age of Onset  . Diabetes Mother   . Thyroid disease Mother   . Hyperlipidemia Father   . Heart disease Father   . Hyperlipidemia Sister    . Cancer Maternal Aunt     Breast Cancer  . Thyroid disease Maternal Aunt     Social History:  reports that she has never smoked. She has never used smokeless tobacco. She reports that she does not drink alcohol or use illicit drugs.  Review of Systems   Back pain:   She had L2-3 degenerative disc disease on her spine x-ray.   She will occasionally take Celebrex as needed for low back pain with relief   OSTEOPENIA with T score on the femoral neck -1.5 in 9/08.  Original height 5'5".   Follow-up bone density shows T score of the right femur to be -2.1 and at the left it is -1.6 T score at the wrist is -3.2, previously - 3.6 Has been prescribed Evista and again she said that she tends to forget taking it as she wants to take it separate from her morning medications    EXAM:  BP 124/82 mmHg  Pulse 72  Temp(Src) 98.4 F (36.9 C)  Resp 16  Ht 5\' 3"  (1.6 m)  Wt 186 lb 9.6 oz (84.641 kg)  BMI 33.06 kg/m2  SpO2 97%    Assessment/Plan:   HYPOTHYROIDISM: She is having no fatigue with increasing her dose to 100 g and TSH is normal as of last visit  HYPERTENSION: blood pressure is improved  with adding ramipril 2.5 mg to her Norvasc 10 mg Needs to continue the same dose of medications.advised her that she can take her ramipril and Norvasc together.  She will try to check at home also as before  OSTEOPOROSIS: Since she has a significantly low bone density at the wrist and is not on any treatment  Reminded her to start her on Evista more consistently and she can take it with her other medications after breakfast   Hyperlipidemia: Controlled on diet alone. Continue low saturated fat diet    History of impaired fasting glucose: Fasting glucose and A1c normal, needs continued lifestyle changes   KUMAR,AJAY 02/24/2015, 1:26 PM   Note: This office note was prepared with Estate agent. Any transcriptional errors that result from this process are  unintentional.

## 2015-02-24 NOTE — Patient Instructions (Signed)
Brisk walking for exercise

## 2015-04-17 ENCOUNTER — Other Ambulatory Visit: Payer: Self-pay | Admitting: Endocrinology

## 2015-06-11 ENCOUNTER — Other Ambulatory Visit: Payer: Self-pay | Admitting: Internal Medicine

## 2015-06-11 ENCOUNTER — Other Ambulatory Visit: Payer: Self-pay | Admitting: Endocrinology

## 2015-08-20 ENCOUNTER — Other Ambulatory Visit (INDEPENDENT_AMBULATORY_CARE_PROVIDER_SITE_OTHER): Payer: BLUE CROSS/BLUE SHIELD

## 2015-08-20 ENCOUNTER — Other Ambulatory Visit: Payer: BLUE CROSS/BLUE SHIELD

## 2015-08-20 DIAGNOSIS — I10 Essential (primary) hypertension: Secondary | ICD-10-CM | POA: Diagnosis not present

## 2015-08-20 DIAGNOSIS — E038 Other specified hypothyroidism: Secondary | ICD-10-CM

## 2015-08-20 DIAGNOSIS — E063 Autoimmune thyroiditis: Secondary | ICD-10-CM

## 2015-08-20 LAB — BASIC METABOLIC PANEL
BUN: 14 mg/dL (ref 6–23)
CO2: 31 mEq/L (ref 19–32)
Calcium: 9.7 mg/dL (ref 8.4–10.5)
Chloride: 103 mEq/L (ref 96–112)
Creatinine, Ser: 0.76 mg/dL (ref 0.40–1.20)
GFR: 80.07 mL/min (ref >60.00)
Glucose, Bld: 96 mg/dL (ref 70–99)
Potassium: 4.2 mEq/L (ref 3.5–5.1)
Sodium: 140 mEq/L (ref 135–145)

## 2015-08-20 LAB — TSH: TSH: 1.11 u[IU]/mL (ref 0.35–4.50)

## 2015-08-22 ENCOUNTER — Other Ambulatory Visit (INDEPENDENT_AMBULATORY_CARE_PROVIDER_SITE_OTHER): Payer: BLUE CROSS/BLUE SHIELD

## 2015-08-22 DIAGNOSIS — R937 Abnormal findings on diagnostic imaging of other parts of musculoskeletal system: Secondary | ICD-10-CM

## 2015-08-22 DIAGNOSIS — E785 Hyperlipidemia, unspecified: Secondary | ICD-10-CM

## 2015-08-22 LAB — LIPID PANEL
CHOL/HDL RATIO: 3
Cholesterol: 194 mg/dL (ref 0–200)
HDL: 72.5 mg/dL (ref >39.00)
LDL Cholesterol: 100 mg/dL — ABNORMAL HIGH (ref 0–99)
NonHDL: 121.24
Triglycerides: 105 mg/dL (ref 0.0–149.0)
VLDL: 21 mg/dL (ref 0.0–40.0)

## 2015-08-25 ENCOUNTER — Encounter: Payer: Self-pay | Admitting: Endocrinology

## 2015-08-25 ENCOUNTER — Ambulatory Visit (INDEPENDENT_AMBULATORY_CARE_PROVIDER_SITE_OTHER): Payer: BLUE CROSS/BLUE SHIELD | Admitting: Endocrinology

## 2015-08-25 VITALS — BP 122/82 | HR 71 | Temp 98.0°F | Resp 14 | Ht 63.0 in | Wt 189.0 lb

## 2015-08-25 DIAGNOSIS — E038 Other specified hypothyroidism: Secondary | ICD-10-CM | POA: Diagnosis not present

## 2015-08-25 DIAGNOSIS — E063 Autoimmune thyroiditis: Secondary | ICD-10-CM

## 2015-08-25 DIAGNOSIS — Z23 Encounter for immunization: Secondary | ICD-10-CM

## 2015-08-25 DIAGNOSIS — R7303 Prediabetes: Secondary | ICD-10-CM

## 2015-08-25 DIAGNOSIS — M810 Age-related osteoporosis without current pathological fracture: Secondary | ICD-10-CM

## 2015-08-25 DIAGNOSIS — E785 Hyperlipidemia, unspecified: Secondary | ICD-10-CM

## 2015-08-25 MED ORDER — RALOXIFENE HCL 60 MG PO TABS: 60.0000 mg | ORAL_TABLET | Freq: Every day | ORAL | Status: DC

## 2015-08-25 NOTE — Progress Notes (Signed)
Patient ID: Alexis Beard, female   DOB: Dec 15, 1945, 70 y.o.   MRN: [id number]   Chief complaint:  Followup of multiple problems  History of Present Illness:    Hypertension: she has had long-standing mild hypertension.     In 4/16 because of high blood pressure and symptoms of dizziness she was given ramipril 2.5 in addition to the 10 mg Norvasc that she had been taking. Blood pressure is well-controlled     She has had HYPOTHYROIDISM since about 1995 and previously had been a dose of 88 mcg. Several years ago was taking 112 mcg.Marland Kitchen TSH was high in 4/16 and she is since then taking 100 g daily with improvement in her fatigue She has been taking her Synthroid consistently in the mornings.  No recent fatigue. TSH is back to being consistently normal  Lab Results  Component Value Date   TSH 1.11 08/20/2015   TSH 2.23 10/22/2014   TSH 5.52* 09/10/2014   FREET4 0.92 04/02/2014   FREET4 1.13 12/07/2013   FREET4 1.04 08/02/2013     Her cholesterol has been periodically high when she does not watch her desserts and meats.  Usually her LDL has been near 100  and has not been on any medications.  More recently she has been getting more desserts and also some high fat dairy products such as cream in her coffee, getting significant amount of this  Lab Results  Component Value Date   CHOL 194 08/22/2015   HDL 72.50 08/22/2015   LDLCALC 100* 08/22/2015   LDLDIRECT 135.4 05/02/2013   TRIG 105.0 08/22/2015   CHOLHDL 3 08/22/2015      She has a history of prediabetes but blood sugar has been very consistently normal including recently.  She has been trying to walk but has difficulty losing weight.  A1c has been consistently normal   Wt Readings from Last 3 Encounters:  08/25/15 189 lb (85.73 kg)  02/24/15 186 lb 9.6 oz (84.641 kg)  10/25/14 184 lb 3.2 oz (83.553 kg)    Lab Results  Component Value Date   HGBA1C 5.7 02/19/2015   HGBA1C 5.7 12/07/2013   HGBA1C 5.8  05/02/2013   Lab Results  Component Value Date   LDLCALC 100* 08/22/2015   CREATININE 0.76 08/20/2015      OSTEOPOROSIS:   See review of systems   Vitamin D deficiency: She has been prescribed  50,000 units and  has taken it once a month as directed. Her last level was 40.        Medication List       This list is accurate as of: 08/25/15 10:53 AM.  Always use your most recent med list.               amLODipine 10 MG tablet  Commonly known as:  NORVASC  TAKE 1 TABLET BY MOUTH EVERY DAY     levothyroxine 100 MCG tablet  Commonly known as:  SYNTHROID, LEVOTHROID  TAKE 1 TABLET BY MOUTH EVERY DAY     raloxifene 60 MG tablet  Commonly known as:  EVISTA  Take 1 tablet (60 mg total) by mouth daily.     ramipril 2.5 MG capsule  Commonly known as:  ALTACE  Take 1 capsule (2.5 mg total) by mouth daily.     Vitamin D (Ergocalciferol) 50000 units Caps capsule  Commonly known as:  DRISDOL  TAKE 1 CAPSULE (50,000 UNITS TOTAL) BY MOUTH EVERY 7 (SEVEN) DAYS.  Allergies:  Allergies  Allergen Reactions  . Codeine Nausea And Vomiting    Past Medical History  Diagnosis Date  . Hypertension   . Hypothyroid   . Arthritis   . Cancer [facility])     Basal cell Carcinoma    Past Surgical History  Procedure Laterality Date  . Carpal tunnel release    . Birth mark removal      on (R) side of face    Family History  Problem Relation Age of Onset  . Diabetes Mother   . Thyroid disease Mother   . Hyperlipidemia Father   . Heart disease Father   . Hyperlipidemia Sister   . Cancer Maternal Aunt     Breast Cancer  . Thyroid disease Maternal Aunt     Social History:  reports that she has never smoked. She has never used smokeless tobacco. She reports that she does not drink alcohol or use illicit drugs.  Review of Systems   Back pain:   This is chronic but relatively better recently She had L2-3 degenerative disc disease on her spine x-ray.   She will  occasionally take Celebrex as needed for low back pain with relief  OSTEOPENIA in 1/16 with T score on the femoral neck -1.5 in 9/08.  Original height 5'5".   Follow-up bone density shows T score of the right femur to be -2.1 and at the left it is -1.6 T score at the wrist is -3.2, previously - 3.6 Has been prescribed Evista and again she has not taken it, she says she does not have a prescription  She had a general exam with a family practice, does not know the name of the doctor  She is asking about dryness of her hair and brittleness but no hair loss  Also asking about itching on the back but does not know what medication the dermatologist recommended   EXAM:  BP 122/82 mmHg  Pulse 71  Temp(Src) 98 F (36.7 C) (Oral)  Resp 14  Ht 5\' 3"  (1.6 m)  Wt 189 lb (85.73 kg)  BMI 33.49 kg/m2  SpO2 97%    Assessment/Plan:   HYPOTHYROIDISM: She is having no fatigue with current regimen of 100 g and is compliant with it in the mornings TSH is normal  She continues to take this dose Discussed again avoiding any interaction with calcium or vitamin continuing iron  HYPERTENSION: blood pressure is now consistently controlled  with adding ramipril 2.5 mg to her Norvasc 10 mg Needs to continue the same dose of medications She should check her blood pressure periodically at home also  OSTEOPOROSIS: Since she has a significantly low bone density at the wrist she does need to be on Evista Again discussed the benefits of this and prevention of osteoporotic fractures in the future Discussed side effects and also effects on her mention of invasive breast cancer with this   Hyperlipidemia: Controlled on diet alone.  Discussed low saturated fat diet especially in desserts, a products that she is not avoiding currently   History of impaired fasting glucose: Fasting glucose and A1c normal, needs improved lifestyle changes and especially weight loss because of her family history of diabetes.   Discussed prevention with lifestyle changes at the most effective modality  Low back pain secondary to lumbar spondylosis: Stable  Preventive care: She has not had any Pneumovax or Prevnar and will give this today   Counseling time on subjects discussed above is over 50% of today's 25 minute visit  Oaklyn Jakubek 08/25/2015, 10:53 AM   Note: This office note was prepared with Dragon voice recognition system technology. Any transcriptional errors that result from this process are unintentional.

## 2015-08-25 NOTE — Patient Instructions (Signed)
Take Evista daily, can take any time of day  Watch sweets and hi carb foods  Walk daily

## 2015-08-31 ENCOUNTER — Other Ambulatory Visit: Payer: Self-pay | Admitting: Endocrinology

## 2015-09-15 HISTORY — PX: BASAL CELL CARCINOMA EXCISION: SHX1214

## 2015-10-06 ENCOUNTER — Other Ambulatory Visit: Payer: Self-pay | Admitting: *Deleted

## 2015-10-06 ENCOUNTER — Telehealth: Payer: Self-pay | Admitting: Endocrinology

## 2015-10-06 MED ORDER — SYNTHROID 100 MCG PO TABS: 100.0000 ug | ORAL_TABLET | Freq: Every day | ORAL | Status: DC

## 2015-10-06 NOTE — Telephone Encounter (Signed)
Patient stated she is getting all the Side effects from the levothyroxine, she ask if you can write her a prescription for the synthroid. Send to  [id number] - [place], [place] [phone] (Phone) [phone] (Fax)       Patient would like a call back

## 2015-10-06 NOTE — Telephone Encounter (Signed)
Patient says she is having insomnia and her hair is very crunchy and dry, please advise if okay to send in name brand?

## 2015-10-06 NOTE — Telephone Encounter (Signed)
Noted, she still wanted the name brand sent in.

## 2015-10-06 NOTE — Telephone Encounter (Signed)
This is not from the generic medication.  We can have her come in and get her thyroid checked if need be However if she wants to switch to brand-name that's okay

## 2015-10-14 ENCOUNTER — Other Ambulatory Visit: Payer: Self-pay | Admitting: Endocrinology

## 2015-12-25 ENCOUNTER — Other Ambulatory Visit (INDEPENDENT_AMBULATORY_CARE_PROVIDER_SITE_OTHER): Payer: BLUE CROSS/BLUE SHIELD

## 2015-12-25 DIAGNOSIS — Z23 Encounter for immunization: Secondary | ICD-10-CM

## 2015-12-25 DIAGNOSIS — R7303 Prediabetes: Secondary | ICD-10-CM | POA: Diagnosis not present

## 2015-12-25 DIAGNOSIS — E038 Other specified hypothyroidism: Secondary | ICD-10-CM | POA: Diagnosis not present

## 2015-12-25 DIAGNOSIS — E785 Hyperlipidemia, unspecified: Secondary | ICD-10-CM | POA: Diagnosis not present

## 2015-12-25 DIAGNOSIS — M810 Age-related osteoporosis without current pathological fracture: Secondary | ICD-10-CM | POA: Diagnosis not present

## 2015-12-25 DIAGNOSIS — E063 Autoimmune thyroiditis: Secondary | ICD-10-CM

## 2015-12-25 LAB — COMPREHENSIVE METABOLIC PANEL
ALBUMIN: 4.2 g/dL (ref 3.5–5.2)
ALK PHOS: 71 U/L (ref 39–117)
ALT: 14 U/L (ref 0–35)
AST: 16 U/L (ref 0–37)
BUN: 11 mg/dL (ref 6–23)
CHLORIDE: 103 meq/L (ref 96–112)
CO2: 32 mEq/L (ref 19–32)
Calcium: 9.5 mg/dL (ref 8.4–10.5)
Creatinine, Ser: 0.73 mg/dL (ref 0.40–1.20)
GFR: 83.8 mL/min (ref >60.00)
GLUCOSE: 102 mg/dL — AB (ref 70–99)
POTASSIUM: 3.9 meq/L (ref 3.5–5.1)
SODIUM: 140 meq/L (ref 135–145)
TOTAL PROTEIN: 7.4 g/dL (ref 6.0–8.3)
Total Bilirubin: 0.6 mg/dL (ref 0.2–1.2)

## 2015-12-25 LAB — LIPID PANEL
CHOLESTEROL: 194 mg/dL (ref 0–200)
HDL: 69.1 mg/dL (ref >39.00)
LDL CALC: 105 mg/dL — AB (ref 0–99)
NonHDL: 125.27
Total CHOL/HDL Ratio: 3
Triglycerides: 99 mg/dL (ref 0.0–149.0)
VLDL: 19.8 mg/dL (ref 0.0–40.0)

## 2015-12-25 LAB — TSH: TSH: 0.73 u[IU]/mL (ref 0.35–4.50)

## 2015-12-25 LAB — VITAMIN D 25 HYDROXY (VIT D DEFICIENCY, FRACTURES): VITD: 42.23 ng/mL (ref 30.00–100.00)

## 2015-12-25 LAB — HEMOGLOBIN A1C: HEMOGLOBIN A1C: 5.7 % (ref 4.6–6.5)

## 2015-12-29 ENCOUNTER — Ambulatory Visit (INDEPENDENT_AMBULATORY_CARE_PROVIDER_SITE_OTHER): Payer: BLUE CROSS/BLUE SHIELD | Admitting: Endocrinology

## 2015-12-29 ENCOUNTER — Encounter: Payer: Self-pay | Admitting: Endocrinology

## 2015-12-29 VITALS — BP 122/72 | HR 83 | Ht 63.0 in | Wt 185.0 lb

## 2015-12-29 DIAGNOSIS — M810 Age-related osteoporosis without current pathological fracture: Secondary | ICD-10-CM

## 2015-12-29 DIAGNOSIS — E038 Other specified hypothyroidism: Secondary | ICD-10-CM

## 2015-12-29 DIAGNOSIS — R7301 Impaired fasting glucose: Secondary | ICD-10-CM | POA: Diagnosis not present

## 2015-12-29 DIAGNOSIS — E063 Autoimmune thyroiditis: Secondary | ICD-10-CM

## 2015-12-29 NOTE — Patient Instructions (Addendum)
Restart Evista  Stretch hamstrings at bedtime, try tonic water  Stay on low fat diet, look for saturated fat in diet

## 2015-12-29 NOTE — Progress Notes (Signed)
Patient ID: Alexis Beard, female   DOB: 1946/02/18, 70 y.o.   MRN: [id number]   Chief complaint:  Followup of multiple problems  History of Present Illness:    Hypertension: she has had long-standing mild hypertension.     In 4/16 because of high blood pressure and symptoms of dizziness she was given ramipril 2.5 in addition to the 10 mg Norvasc that she had been taking. Blood pressure is well-controlled and consistent subsequently     She has had primary HYPOTHYROIDISM since about 1995 and previously had been on doses between 88 and 112 g  TSH was high in 4/16 and she is since then taking 100 g daily  She has been taking her Synthroid consistently in the mornings.  No recent fatigue. TSH is back to being consistently normal  Lab Results  Component Value Date   TSH 0.73 12/25/2015   TSH 1.11 08/20/2015   TSH 2.23 10/22/2014   FREET4 0.92 04/02/2014   FREET4 1.13 12/07/2013   FREET4 1.04 08/02/2013      Her cholesterol has been periodically high  Usually her LDL has been near 100  and has not been on any medications. Appears to have a gradual increase in her LDL even though she is cutting back on desserts and ice cream and eating usually low fat meats or chicken     Lab Results  Component Value Date   CHOL 194 12/25/2015   HDL 69.10 12/25/2015   LDLCALC 105 (H) 12/25/2015   LDLDIRECT 135.4 05/02/2013   TRIG 99.0 12/25/2015   CHOLHDL 3 12/25/2015      She has a history of prediabetes but blood sugar has been Usually consistently normal Recent glucose is 102 fasting Her weight is slightly better and she is trying to walk more A1c is stable in the borderline range for prediabetes      Wt Readings from Last 3 Encounters:  12/29/15 185 lb (83.9 kg)  08/25/15 189 lb (85.7 kg)  02/24/15 186 lb 9.6 oz (84.6 kg)    Lab Results  Component Value Date   HGBA1C 5.7 12/25/2015   HGBA1C 5.7 02/19/2015   HGBA1C 5.7 12/07/2013   Lab Results  Component  Value Date   LDLCALC 105 (H) 12/25/2015   CREATININE 0.73 12/25/2015      OSTEOPOROSIS:   See review of systems   Vitamin D deficiency: She has been prescribed  50,000 units and  has taken it once a month as directed. Her last level was normal       Medication List       Accurate as of 12/29/15 10:43 AM. Always use your most recent med list.          amLODipine 10 MG tablet Commonly known as:  NORVASC TAKE 1 TABLET BY MOUTH EVERY DAY   raloxifene 60 MG tablet Commonly known as:  EVISTA Take 1 tablet (60 mg total) by mouth daily.   ramipril 2.5 MG capsule Commonly known as:  ALTACE Take 1 capsule (2.5 mg total) by mouth daily.   SYNTHROID 100 MCG tablet Generic drug:  levothyroxine Take 1 tablet (100 mcg total) by mouth daily before breakfast.   Vitamin D (Ergocalciferol) 50000 units Caps capsule Commonly known as:  DRISDOL TAKE 1 CAPSULE (50,000 UNITS TOTAL) BY MOUTH EVERY 7 (SEVEN) DAYS.       Allergies:  Allergies  Allergen Reactions  . Codeine Nausea And Vomiting    Past Medical History:  Diagnosis Date  .  Arthritis   . Cancer ([place])    Basal cell Carcinoma  . Hypertension   . Hypothyroid     Past Surgical History:  Procedure Laterality Date  . birth mark removal     on (R) side of face  . CARPAL TUNNEL RELEASE      Family History  Problem Relation Age of Onset  . Diabetes Mother   . Thyroid disease Mother   . Hyperlipidemia Father   . Heart disease Father   . Hyperlipidemia Sister   . Cancer Maternal Aunt     Breast Cancer  . Thyroid disease Maternal Aunt     Social History:  reports that she has never smoked. She has never used smokeless tobacco. She reports that she does not drink alcohol or use drugs.  Review of Systems   Back pain:   This is chronic but relatively better recently She had L2-3 degenerative disc disease on her spine x-ray.   She will occasionally take Celebrex as needed for low back pain with  relief  OSTEOPENIA in 1/16 with T score on the femoral neck -1.5 in 9/08.  Original height 5'5".   Follow-up bone density shows T score of the right femur to be -2.1 and at the left it is -1.6 T score at the wrist is -3.2, previously - 3.6 Has been prescribed Evista and again she has not taken it, she says she does not have a prescription  She had a general exam with a family practice, does not know the name of the doctor    EXAM:  BP 122/72   Pulse 83   Ht 5\' 3"  (1.6 m)   Wt 185 lb (83.9 kg)   SpO2 97%   BMI 32.77 kg/m     Assessment/Plan:   HYPOTHYROIDISM, long-standing: She is clinically doing well with current regimen of Synthroid 100 g and is compliant with it in the mornings TSH is normal  She will continue to take this dose  HYPERTENSION: blood pressure is  consistently controlled  with ramipril 2.5 mg and Norvasc 10 mg No side effects Needs to continue the same dose of medications  OSTEOPOROSIS: Since she has a significantly low bone density at the wrist she does need to be on a medication like Evista She has tried this before but keeps finding potential reasons for not taking it for fear of side effects Discussed that it does not cause muscle cramps  Again discussed the benefits of this and prevention of osteoporotic fractures in the future She agrees to start that again. Emphasized need to continue vitamin D and calcium also  MUSCLE cramps: Likely to be idiopathic and she can try bedtime stretching of the hamstring muscles and OTC remedies like tonic water   Hyperlipidemia: Controlled on diet alone.  LDL is slightly high at 105, no other risk factors to require statin treatment  Discussed low saturated fat diet  History of impaired fasting glucose: Fasting glucose slightly above 100 but  A1c normal She is trying to walk and watch her diet with some weight loss recently  Low back pain secondary to lumbar spondylosis: Stable    She will be referred to a  PCP for preventive care  [facility] 12/29/2015, 10:43 AM   Note: This office note was prepared with Estate agent. Any transcriptional errors that result from this process are unintentional.

## 2016-04-12 ENCOUNTER — Other Ambulatory Visit: Payer: Self-pay | Admitting: Endocrinology

## 2016-04-21 ENCOUNTER — Telehealth: Payer: Self-pay | Admitting: Endocrinology

## 2016-04-21 ENCOUNTER — Other Ambulatory Visit: Payer: Self-pay | Admitting: Endocrinology

## 2016-04-21 NOTE — Telephone Encounter (Signed)
Patient need a refill of amLODipine (NORVASC) 10 MG tablet  But she prefer the Norvasc brand name..  [id number] - [place], [place] - [address] [phone] (Phone) [phone] (Fax)

## 2016-04-22 ENCOUNTER — Other Ambulatory Visit: Payer: Self-pay

## 2016-04-22 MED ORDER — AMLODIPINE BESYLATE 10 MG PO TABS: 10.0000 mg | ORAL_TABLET | Freq: Every day | ORAL | 5 refills | Status: DC

## 2016-04-22 NOTE — Telephone Encounter (Signed)
Ordered 04/22/16

## 2016-06-25 ENCOUNTER — Other Ambulatory Visit (INDEPENDENT_AMBULATORY_CARE_PROVIDER_SITE_OTHER): Payer: BLUE CROSS/BLUE SHIELD

## 2016-06-25 DIAGNOSIS — R7301 Impaired fasting glucose: Secondary | ICD-10-CM

## 2016-06-25 DIAGNOSIS — E038 Other specified hypothyroidism: Secondary | ICD-10-CM

## 2016-06-25 DIAGNOSIS — E063 Autoimmune thyroiditis: Secondary | ICD-10-CM

## 2016-06-25 LAB — COMPREHENSIVE METABOLIC PANEL
ALK PHOS: 73 U/L (ref 39–117)
ALT: 13 U/L (ref 0–35)
AST: 14 U/L (ref 0–37)
Albumin: 4.2 g/dL (ref 3.5–5.2)
BILIRUBIN TOTAL: 0.4 mg/dL (ref 0.2–1.2)
BUN: 13 mg/dL (ref 6–23)
CALCIUM: 9.5 mg/dL (ref 8.4–10.5)
CO2: 32 mEq/L (ref 19–32)
Chloride: 103 mEq/L (ref 96–112)
Creatinine, Ser: 0.75 mg/dL (ref 0.40–1.20)
GFR: 81.1 mL/min (ref >60.00)
Glucose, Bld: 97 mg/dL (ref 70–99)
POTASSIUM: 4.1 meq/L (ref 3.5–5.1)
Sodium: 140 mEq/L (ref 135–145)
TOTAL PROTEIN: 7.2 g/dL (ref 6.0–8.3)

## 2016-06-25 LAB — LIPID PANEL
CHOLESTEROL: 203 mg/dL — AB (ref 0–200)
HDL: 82.1 mg/dL (ref >39.00)
LDL Cholesterol: 106 mg/dL — ABNORMAL HIGH (ref 0–99)
NONHDL: 120.4
TRIGLYCERIDES: 72 mg/dL (ref 0.0–149.0)
Total CHOL/HDL Ratio: 2
VLDL: 14.4 mg/dL (ref 0.0–40.0)

## 2016-06-25 LAB — TSH: TSH: 0.79 u[IU]/mL (ref 0.35–4.50)

## 2016-06-25 LAB — T4, FREE: FREE T4: 0.96 ng/dL (ref 0.60–1.60)

## 2016-06-25 LAB — HEMOGLOBIN A1C: Hgb A1c MFr Bld: 5.9 % (ref 4.6–6.5)

## 2016-06-30 ENCOUNTER — Ambulatory Visit (INDEPENDENT_AMBULATORY_CARE_PROVIDER_SITE_OTHER): Payer: BLUE CROSS/BLUE SHIELD | Admitting: Endocrinology

## 2016-06-30 ENCOUNTER — Encounter: Payer: Self-pay | Admitting: Endocrinology

## 2016-06-30 VITALS — BP 140/80 | HR 74 | Ht 63.0 in | Wt 186.0 lb

## 2016-06-30 DIAGNOSIS — I10 Essential (primary) hypertension: Secondary | ICD-10-CM | POA: Diagnosis not present

## 2016-06-30 DIAGNOSIS — E063 Autoimmune thyroiditis: Secondary | ICD-10-CM

## 2016-06-30 DIAGNOSIS — M810 Age-related osteoporosis without current pathological fracture: Secondary | ICD-10-CM

## 2016-06-30 DIAGNOSIS — E038 Other specified hypothyroidism: Secondary | ICD-10-CM | POA: Diagnosis not present

## 2016-06-30 NOTE — Patient Instructions (Signed)
Dr Raoul Pitch

## 2016-06-30 NOTE — Progress Notes (Addendum)
Patient ID: Alexis Beard, female   DOB: May 04, 1946, 71 y.o.   MRN: [id number]   Chief complaint:  Followup of multiple problems  History of Present Illness:    Hypertension: she has had long-standing hypertension.      Blood pressure is well-controlled and consistent with a combination of ramipril and amlodipine However she decided on her own that she wants to take less medications and stopped taking the ramipril Does not monitor at home     She has had primary HYPOTHYROIDISM since about 1995 and previously had been on doses between 88 and 112 g  TSH was high in 4/16 and she is since then taking 100 g daily of brand name Synthroid She has been taking her Synthroid consistently in the mornings.  No recent weight change or fatigue.  TSH is consistently normal  Lab Results  Component Value Date   TSH 0.79 06/25/2016   TSH 0.73 12/25/2015   TSH 1.11 08/20/2015   FREET4 0.96 06/25/2016   FREET4 0.92 04/02/2014   FREET4 1.13 12/07/2013      Her cholesterol has been periodically high  Usually her LDL has been near 100  and has not been on any Statin drugs Her mother did have coronary bypass surgery but she also had apparently severe diabetes She thinks she is following a healthy diet with cutting back on desserts LDL is about the same at 106     Lab Results  Component Value Date   CHOL 203 (H) 06/25/2016   HDL 82.10 06/25/2016   LDLCALC 106 (H) 06/25/2016   LDLDIRECT 135.4 05/02/2013   TRIG 72.0 06/25/2016   CHOLHDL 2 06/25/2016      She has a history of prediabetes but Fasting blood sugar has been Usually consistently normal Recent glucose is 97 fasting Her weight is stable  However she has not walked or exercised recently because of cold weather    A1c is stable in the borderline range for prediabetes      Wt Readings from Last 3 Encounters:  06/30/16 186 lb (84.4 kg)  12/29/15 185 lb (83.9 kg)  08/25/15 189 lb (85.7 kg)    Lab Results    Component Value Date   HGBA1C 5.9 06/25/2016   HGBA1C 5.7 12/25/2015   HGBA1C 5.7 02/19/2015   Lab Results  Component Value Date   LDLCALC 106 (H) 06/25/2016   CREATININE 0.75 06/25/2016      OSTEOPOROSIS:   See review of systems   Vitamin D deficiency: She has been prescribed  50,000 units and  has taken it once a month as directed. Her last level was 42     Allergies as of 06/30/2016      Reactions   Codeine Nausea And Vomiting      Medication List       Accurate as of 06/30/16 10:54 AM. Always use your most recent med list.          amLODipine 10 MG tablet Commonly known as:  NORVASC Take 1 tablet (10 mg total) by mouth daily.   raloxifene 60 MG tablet Commonly known as:  EVISTA Take 1 tablet (60 mg total) by mouth daily.   ramipril 2.5 MG capsule Commonly known as:  ALTACE Take 1 capsule (2.5 mg total) by mouth daily.   SYNTHROID 100 MCG tablet Generic drug:  levothyroxine TAKE 1 TABLET EVERY DAY BEFORE BREAKFAST   Vitamin D (Ergocalciferol) 50000 units Caps capsule Commonly known as:  DRISDOL TAKE 1  CAPSULE (50,000 UNITS TOTAL) BY MOUTH EVERY 7 (SEVEN) DAYS.       Allergies:  Allergies  Allergen Reactions  . Codeine Nausea And Vomiting    Past Medical History:  Diagnosis Date  . Arthritis   . Cancer ([place])    Basal cell Carcinoma  . Hypertension   . Hypothyroid     Past Surgical History:  Procedure Laterality Date  . birth mark removal     on (R) side of face  . CARPAL TUNNEL RELEASE    . melanoma   09/2015   Back    Family History  Problem Relation Age of Onset  . Diabetes Mother   . Thyroid disease Mother   . Hyperlipidemia Father   . Heart disease Father   . Hyperlipidemia Sister   . Cancer Maternal Aunt     Breast Cancer  . Thyroid disease Maternal Aunt     Social History:  reports that she has never smoked. She has never used smokeless tobacco. She reports that she does not drink alcohol or use drugs.  Review of  Systems   Back pain:   This is intermittent She had L2-3 degenerative disc disease on her spine x-ray.   She will occasionally take Celebrex as needed for low back pain with relief  OSTEOPENIA with T score on the femoral neck -1.5 in 9/08.  Original height 5'5".   Follow-up bone density  in 1/16 shows T score of the right femur to be -2.1 and at the left it is -1.6 T score at the wrist is -3.2, previously - 3.6  Has been prescribed Evista on each visit but she refuses to take it for fear of side effects She thinks she may have had bad dreams with this      EXAM:  BP 140/80   Pulse 74   Ht 5\' 3"  (1.6 m)   Wt 186 lb (84.4 kg)   SpO2 96%   BMI 32.95 kg/m     Assessment/Plan:   HYPOTHYROIDISM, long-standing: She is clinically doing well with current regimen of Synthroid 100 g TSH is normal  She will continue to take the same dose of brand name Synthroid  HYPERTENSION: blood pressure is  consistently controlled  with ramipril 2.5 mg and Norvasc 10 mg No side effects Needs to continue the same dose of medications  OSTEOPOROSIS: Since she has a significantly low bone density at the wrist she does need to be on a medication like Evista She has tried this before but keeps finding potential reasons for not taking it for fear of side effects  Again discussed the need for preventing any worsening of her bone density, this will be repeated now and she may agree to do Reclast infusion  Hyperlipidemia: Controlled on diet alone.  LDL is slightly high at 106, no other risk factors to require statin treatment   History of impaired fasting glucose: Fasting glucose slightly below 100 but  A1c normal Emphasized the need to restart walking for exercise and bring her weight down further    She will be referred to a PCP for preventive care  [facility] 06/30/2016, 10:54 AM   Note: This office note was prepared with Dragon voice recognition system technology. Any transcriptional  errors that result from this process are unintentional.   ADDENDUM: Bone density on 07/22/16: shows T score of the right femur to be -2.2, stable and at the left it is -1.5, 2% increase  T score at the wrist is -  3.4, previously -3.2 Overall stable

## 2016-07-13 ENCOUNTER — Other Ambulatory Visit: Payer: Self-pay

## 2016-07-22 ENCOUNTER — Encounter: Payer: Self-pay | Admitting: Endocrinology

## 2016-08-04 ENCOUNTER — Telehealth: Payer: Self-pay | Admitting: Endocrinology

## 2016-08-04 NOTE — Telephone Encounter (Signed)
Please let her know that the bone density is about the same as 2 years ago, no change in treatment needed, has only lost a processes at the wrist, about the same as before

## 2016-08-06 NOTE — Telephone Encounter (Signed)
Left voice mail with results and requested a call back if she had any questions

## 2016-09-24 ENCOUNTER — Other Ambulatory Visit: Payer: Self-pay | Admitting: Endocrinology

## 2016-10-08 ENCOUNTER — Other Ambulatory Visit: Payer: Self-pay | Admitting: Endocrinology

## 2016-10-18 ENCOUNTER — Other Ambulatory Visit: Payer: Self-pay | Admitting: Endocrinology

## 2016-10-19 ENCOUNTER — Other Ambulatory Visit: Payer: Self-pay

## 2016-10-19 MED ORDER — AMLODIPINE BESYLATE 10 MG PO TABS: 10.0000 mg | ORAL_TABLET | Freq: Every day | ORAL | 1 refills | Status: DC

## 2016-12-20 ENCOUNTER — Ambulatory Visit (INDEPENDENT_AMBULATORY_CARE_PROVIDER_SITE_OTHER): Payer: BLUE CROSS/BLUE SHIELD | Admitting: Family Medicine

## 2016-12-20 ENCOUNTER — Encounter: Payer: Self-pay | Admitting: Family Medicine

## 2016-12-20 VITALS — BP 145/87 | HR 67 | Temp 97.9°F | Resp 20 | Ht 63.0 in | Wt 185.5 lb

## 2016-12-20 DIAGNOSIS — I10 Essential (primary) hypertension: Secondary | ICD-10-CM | POA: Diagnosis not present

## 2016-12-20 DIAGNOSIS — E039 Hypothyroidism, unspecified: Secondary | ICD-10-CM | POA: Diagnosis not present

## 2016-12-20 DIAGNOSIS — R42 Dizziness and giddiness: Secondary | ICD-10-CM | POA: Diagnosis not present

## 2016-12-20 MED ORDER — HYDROCHLOROTHIAZIDE 25 MG PO TABS: 25.0000 mg | ORAL_TABLET | Freq: Every day | ORAL | 0 refills | Status: DC

## 2016-12-20 MED ORDER — AMLODIPINE BESYLATE 10 MG PO TABS: 10.0000 mg | ORAL_TABLET | Freq: Every day | ORAL | 0 refills | Status: DC

## 2016-12-20 NOTE — Patient Instructions (Signed)
Start HCTZ (hydrocholorthiazide) 25 mg in the morning. Continue the amlodipine.   Start Flonase 1 squirt each nostril daily (this is over the counter), this will help with allergies/ear discomfort.   If vertigo becomes worse will need to see you. This can take time to resolve, but usually resolves on its own.   We will call you with all lab results once available.     Benign Positional Vertigo Vertigo is the feeling that you or your surroundings are moving when they are not. Benign positional vertigo is the most common form of vertigo. The cause of this condition is not serious (is benign). This condition is triggered by certain movements and positions (is positional). This condition can be dangerous if it occurs while you are doing something that could endanger you or others, such as driving. What are the causes? In many cases, the cause of this condition is not known. It may be caused by a disturbance in an area of the inner ear that helps your brain to sense movement and balance. This disturbance can be caused by a viral infection (labyrinthitis), head injury, or repetitive motion. What increases the risk? This condition is more likely to develop in:  Women.  People who are 36 years of age or older.  What are the signs or symptoms? Symptoms of this condition usually happen when you move your head or your eyes in different directions. Symptoms may start suddenly, and they usually last for less than a minute. Symptoms may include:  Loss of balance and falling.  Feeling like you are spinning or moving.  Feeling like your surroundings are spinning or moving.  Nausea and vomiting.  Blurred vision.  Dizziness.  Involuntary eye movement (nystagmus).  Symptoms can be mild and cause only slight annoyance, or they can be severe and interfere with daily life. Episodes of benign positional vertigo may return (recur) over time, and they may be triggered by certain movements. Symptoms may  improve over time. How is this diagnosed? This condition is usually diagnosed by medical history and a physical exam of the head, neck, and ears. You may be referred to a health care provider who specializes in ear, nose, and throat (ENT) problems (otolaryngologist) or a provider who specializes in disorders of the nervous system (neurologist). You may have additional testing, including:  MRI.  A CT scan.  Eye movement tests. Your health care provider may ask you to change positions quickly while he or she watches you for symptoms of benign positional vertigo, such as nystagmus. Eye movement may be tested with an electronystagmogram (ENG), caloric stimulation, the Dix-Hallpike test, or the roll test.  An electroencephalogram (EEG). This records electrical activity in your brain.  Hearing tests.  How is this treated? Usually, your health care provider will treat this by moving your head in specific positions to adjust your inner ear back to normal. Surgery may be needed in severe cases, but this is rare. In some cases, benign positional vertigo may resolve on its own in 2-4 weeks. Follow these instructions at home: Safety  Move slowly.Avoid sudden body or head movements.  Avoid driving.  Avoid operating heavy machinery.  Avoid doing any tasks that would be dangerous to you or others if a vertigo episode would occur.  If you have trouble walking or keeping your balance, try using a cane for stability. If you feel dizzy or unstable, sit down right away.  Return to your normal activities as told by your health care provider. Ask  your health care provider what activities are safe for you. General instructions  Take over-the-counter and prescription medicines only as told by your health care provider.  Avoid certain positions or movements as told by your health care provider.  Drink enough fluid to keep your urine clear or pale yellow.  Keep all follow-up visits as told by your health  care provider. This is important. Contact a health care provider if:  You have a fever.  Your condition gets worse or you develop new symptoms.  Your family or friends notice any behavioral changes.  Your nausea or vomiting gets worse.  You have numbness or a "pins and needles" sensation. Get help right away if:  You have difficulty speaking or moving.  You are always dizzy.  You faint.  You develop severe headaches.  You have weakness in your legs or arms.  You have changes in your hearing or vision.  You develop a stiff neck.  You develop sensitivity to light. This information is not intended to replace advice given to you by your health care provider. Make sure you discuss any questions you have with your health care provider. Document Released: 02/08/2006 Document Revised: 10/09/2015 Document Reviewed: 08/26/2014 Elsevier Interactive Patient Education  [employer].  Please help [place] help you:  We are honored you have chosen [facility] for your Primary Care home. Below you will find basic instructions that you may need to access in the future. Please help [place] help you by reading the instructions, which cover many of the frequent questions we experience.   Prescription refills and request:  -In order to allow more efficient response time, please call your pharmacy for all refills. They will forward the request electronically to [place]. This allows for the quickest possible response. Request left on a nurse line can take longer to refill, since these are checked as time allows between office patients and other phone calls.  - refill request can take up to 3-5 working days to complete.  - If request is sent electronically and request is appropiate, it is usually completed in 1-2 business days.  - all patients will need to be seen routinely for all chronic medical conditions requiring prescription medications (see follow-up below). If you are overdue for follow up on  your condition, you will be asked to make an appointment and we will call in enough medication to cover you until your appointment (up to 30 days).  - all controlled substances will require a face to face visit to request/refill.  - if you desire your prescriptions to go through a new pharmacy, and have an active script at original pharmacy, you will need to call your pharmacy and have scripts transferred to new pharmacy. This is completed between the pharmacy locations and not by your provider.    Results: If any images or labs were ordered, it can take up to 1 week to get results depending on the test ordered and the lab/facility running and resulting the test. - Normal or stable results, which do not need further discussion, may be released to your mychart immediately with attached note to you. A call may not be generated for normal results. Please make certain to sign up for mychart. If you have questions on how to activate your mychart you can call the front office.  - If your results need further discussion, our office will attempt to contact you via phone, and if unable to reach you after 2 attempts, we will release  your abnormal result to your mychart with instructions.  - All results will be automatically released in mychart after 1 week.  - Your provider will provide you with explanation and instruction on all relevant material in your results. Please keep in mind, results and labs may appear confusing or abnormal to the untrained eye, but it does not mean they are actually abnormal for you personally. If you have any questions about your results that are not covered, or you desire more detailed explanation than what was provided, you should make an appointment with your provider to do so.   Our office handles many outgoing and incoming calls daily. If we have not contacted you within 1 week about your results, please check your mychart to see if there is a message first and if not, then contact  our office.  In helping with this matter, you help decrease call volume, and therefore allow [place] to be able to respond to patients needs more efficiently.   Acute office visits (sick visit):  An acute visit is intended for a new problem and are scheduled in shorter time slots to allow schedule openings for patients with new problems. This is the appropriate visit to discuss a new problem. In order to provide you with excellent quality medical care with proper time for you to explain your problem, have an exam and receive treatment with instructions, these appointments should be limited to one new problem per visit. If you experience a new problem, in which you desire to be addressed, please make an acute office visit, we save openings on the schedule to accommodate you. Please do not save your new problem for any other type of visit, let us take care of it properly and quickly for you.   Follow up visits:  Depending on your condition(s) your provider will need to see you routinely in order to provide you with quality care and prescribe medication(s). Most chronic conditions (Example: hypertension, Diabetes, depression/anxiety... etc), require visits a couple times a year. Your provider will instruct you on proper follow up for your personal medical conditions and history. Please make certain to make follow up appointments for your condition as instructed. Failing to do so could result in lapse in your medication treatment/refills. If you request a refill, and are overdue to be seen on a condition, we will always provide you with a 30 day script (once) to allow you time to schedule.    Medicare wellness (well visit): - we have a wonderful Nurse Maudie Mercury), that will meet with you and provide you will yearly medicare wellness visits. These visits should occur yearly (can not be scheduled less than 1 calendar year apart) and cover preventive health, immunizations, advance directives and screenings you are entitled  to yearly through your medicare benefits. Do not miss out on your entitled benefits, this is when medicare will pay for these benefits to be ordered for you.  These are strongly encouraged by your provider and is the appropriate type of visit to make certain you are up to date with all preventive health benefits. If you have not had your medicare wellness exam in the last 12 months, please make certain to schedule one by calling the office and schedule your medicare wellness with Maudie Mercury as soon as possible.   Yearly physical (well visit):  - Adults are recommended to be seen yearly for physicals. Check with your insurance and date of your last physical, most insurances require one calendar year between physicals. Physicals include  all preventive health topics, screenings, medical exam and labs that are appropriate for gender/age and history. You may have fasting labs needed at this visit. This is a well visit (not a sick visit), new problems should not be covered during this visit (see acute visit).  - Pediatric patients are seen more frequently when they are younger. Your provider will advise you on well child visit timing that is appropriate for your their age. - This is not a medicare wellness visit. Medicare wellness exams do not have an exam portion to the visit. Some medicare companies allow for a physical, some do not allow a yearly physical. If your medicare allows a yearly physical you can schedule the medicare wellness with our nurse Maudie Mercury and have your physical with your provider after, on the same day. Please check with insurance for your full benefits.   Late Policy/No Shows:  - all new patients should arrive 15-30 minutes earlier than appointment to allow [place] time  to  obtain all personal demographics,  insurance information and for you to complete office paperwork. - All established patients should arrive 10-15 minutes earlier than appointment time to update all information and be checked in .  -  In our best efforts to run on time, if you are late for your appointment you will be asked to either reschedule or if able, we will work you back into the schedule. There will be a wait time to work you back in the schedule,  depending on availability.  - If you are unable to make it to your appointment as scheduled, please call 24 hours ahead of time to allow [place] to fill the time slot with someone else who needs to be seen. If you do not cancel your appointment ahead of time, you may be charged a no show fee.

## 2016-12-20 NOTE — Progress Notes (Signed)
Patient ID: Alexis Beard, female  DOB: January 21, 1946, 71 y.o.   MRN: [id number] Patient Care Team    Relationship Specialty Notifications Start End  Ma Hillock, DO PCP - General Family Medicine  12/20/16   Rolm Bookbinder, MD Consulting Physician Dermatology  12/22/16   Elayne Snare, MD Consulting Physician Endocrinology  12/22/16     Chief Complaint  Patient presents with  . Establish Care    Subjective:  Alexis Beard is a 71 y.o.  female present for new patient establishment. All past medical history, surgical history, allergies, family history, immunizations, medications and social history were Obtained and entered in the electronic medical record today. All recent labs, ED visits and hospitalizations within the last year were reviewed.  Hypertension: Pt reports compliance with amlodipine 10 mg daily. She had also been prescribed ramipril in the past, but she states she hasn't taken this in a few years because she was worried it was going to make her dizzy. Blood pressures ranges at home not checked. Patient denies chest pain, shortness of breath or lower extremity edema. Pt does not take a daily baby ASA. Pt is not  prescribed statin. BMP: 06/25/2016 within normal limits CBC: Has not been checked since 2015 Lipid: 06/25/2016 total cholesterol 203, HDL 82, LDL 106, triglycerides 72 Diet: Tries to eat a low-sodium diet, however she was at the beach last week and had some increase in swelling of her feet. Exercise: Not routinely RF: Hypertension, family history present, obesity  Dizziness: Patient reports about 3 weeks ago after a beach trip she experienced dizziness. The dizziness is worse in the morning when getting out of bed and turning to the left side. She also endorses some left-sided facial pressure, and sinus congestion. She denies any blood loss. She is eating and drinking well. She explains the sensation as the room spinning, lasting less than 1 minute.  Depression screen  PHQ 2/9 12/20/2016  Decreased Interest 0  Down, Depressed, Hopeless 0  PHQ - 2 Score 0   No flowsheet data found.    Current Exercise Habits: Home exercise routine, Type of exercise: walking, Time (Minutes): 30, Frequency (Times/Week): 7, Weekly Exercise (Minutes/Week): 210, Intensity: Mild   Fall Risk  12/20/2016  Falls in the past year? No     Immunization History  Administered Date(s) Administered  . Pneumococcal Conjugate-13 08/25/2015  . Tdap 08/27/2012    No exam data present  Past Medical History:  Diagnosis Date  . Anemia   . Arthritis   . Basal cell carcinoma (BCC)    Basal cell Carcinoma  . Cataract   . Colon polyps   . Colon polyps   . Diverticulosis   . Hypertension   . Hypothyroid   . Migraines   . Osteopenia    Allergies  Allergen Reactions  . Codeine Nausea And Vomiting   Past Surgical History:  Procedure Laterality Date  . BASAL CELL CARCINOMA EXCISION  09/2015   Back  . birth mark removal  1998   on (R) side of neck  . CARPAL TUNNEL RELEASE  1998  . TONSILLECTOMY  1956   Family History  Problem Relation Age of Onset  . Diabetes Mother   . Thyroid disease Mother   . Heart disease Mother 58  . Hyperlipidemia Father   . Heart disease Father 48  . Hyperlipidemia Sister   . Cancer Maternal Aunt        Breast Cancer  . Thyroid disease Maternal  Aunt   . Melanoma Maternal Aunt    Social History   Social History  . Marital status: Married    Spouse name: Jeneen Rinks  . Number of children: 3  . Years of education: 12   Occupational History  . Controller    Social History Main Topics  . Smoking status: Never Smoker  . Smokeless tobacco: Never Used  . Alcohol use No  . Drug use: No  . Sexual activity: No   Other Topics Concern  . Not on file   Social History Narrative   Married to [place], has children.   High school graduate.   Drinks caffeine.   Wears her seatbelt, wears a bicycle helmet.   Wears glasses.   Smoke detector in the  home, firearms locked in the home.   Feels safe in her relationships.   Right handed.   Allergies as of 12/20/2016      Reactions   Codeine Nausea And Vomiting      Medication List       Accurate as of 12/20/16 11:59 PM. Always use your most recent med list.          amLODipine 10 MG tablet Commonly known as:  NORVASC Take 1 tablet (10 mg total) by mouth daily.   hydrochlorothiazide 25 MG tablet Commonly known as:  HYDRODIURIL Take 1 tablet (25 mg total) by mouth daily.   SYNTHROID 100 MCG tablet Generic drug:  levothyroxine TAKE 1 TABLET EVERY DAY BEFORE BREAKFAST   Vitamin D (Ergocalciferol) 50000 units Caps capsule Commonly known as:  DRISDOL TAKE 1 CAPSULE (50,000 UNITS TOTAL) BY MOUTH EVERY 7 (SEVEN) DAYS.       All past medical history, surgical history, allergies, family history, immunizations andmedications were updated in the EMR today and reviewed under the history and medication portions of their EMR.    Recent Results (from the past 2160 hour(s))  CBC     Status: None   Collection Time: 12/20/16 10:45 AM  Result Value Ref Range   WBC 6.0 3.8 - 10.8 K/uL   RBC 4.62 3.80 - 5.10 MIL/uL   Hemoglobin 13.5 11.7 - 15.5 g/dL   HCT 41.4 35.0 - 45.0 %   MCV 89.6 80.0 - 100.0 fL   MCH 29.2 27.0 - 33.0 pg   MCHC 32.6 32.0 - 36.0 g/dL   RDW 14.2 11.0 - 15.0 %   Platelets 309 140 - 400 K/uL   MPV 10.5 7.5 - 12.5 fL  COMPLETE METABOLIC PANEL WITH GFR     Status: None   Collection Time: 12/20/16 10:45 AM  Result Value Ref Range   Sodium 140 135 - 146 mmol/L   Potassium 4.1 3.5 - 5.3 mmol/L   Chloride 103 98 - 110 mmol/L   CO2 25 20 - 32 mmol/L    Comment: ** Please note change in reference range(s). **      Glucose, Bld 95 65 - 99 mg/dL   BUN 10 7 - 25 mg/dL   Creat 0.69 0.60 - 0.93 mg/dL    Comment:   For patients > or = 71 years of age: The upper reference limit for Creatinine is approximately 13% higher for people identified as African-American.       Total Bilirubin 0.5 0.2 - 1.2 mg/dL   Alkaline Phosphatase 83 33 - 130 U/L   AST 14 10 - 35 U/L   ALT 13 6 - 29 U/L   Total Protein 7.3 6.1 - 8.1 g/dL  Albumin 4.5 3.6 - 5.1 g/dL   Calcium 9.4 8.6 - 10.4 mg/dL   GFR, Est African American >89 >=60 mL/min   GFR, Est Non African American 88 >=60 mL/min    No results found.   ROS: 14 pt review of systems performed and negative (unless mentioned in an HPI)  Objective: BP (!) 145/87 (BP Location: Right Arm, Patient Position: Sitting, Cuff Size: Normal)   Pulse 67   Temp 97.9 F (36.6 C)   Resp 20   Ht 5\' 3"  (1.6 m)   Wt 185 lb 8 oz (84.1 kg)   SpO2 97%   BMI 32.86 kg/m  Gen: Afebrile. No acute distress. Nontoxic in appearance, well-developed, well-nourished,  pleasant Caucasian female. HENT: AT. [place]. Bilateral TM visualized with mild fullness, no erythema. normal external auditory canal. MMM, no oral lesions, adequate dentition. Bilateral nares within swelling and erythema, no drainage. Throat without erythema, ulcerations or exudates. No Cough on exam, no hoarseness on exam. Mild tenderness to palpation left maxillary sinus. Eyes:Pupils Equal Round Reactive to light, Extraocular movements intact,  Conjunctiva without redness, discharge or icterus. Neck/lymp/endocrine: Supple, no lymphadenopathy, no thyromegaly CV: RRR no murmur, no edema, +2/4 P posterior tibialis pulses. No carotid bruits. No JVD. Chest: CTAB, no wheeze, rhonchi or crackles. Normal Respiratory effort. Good Air movement. Abd: Soft. NTND. BS present. Skin: Warm and well-perfused. Skin intact. Neuro/Msk: Normal gait. PERLA. EOMi. Alert. Oriented x3.   Psych: Normal affect, dress and demeanor. Normal speech. Normal thought content and judgment.   Assessment/plan: Alexis Beard is a 71 y.o. female present for establish care.  Essential hypertension - Continue amlodipine 10 mg daily, and start HCTZ 25 mg daily. - CBC - COMPLETE METABOLIC PANEL WITH GFR -  Low-sodium, exercise.  - Follow-up in 4 weeks on new med start, when stable will follow for hypertension every 6 months.  Vertigo/sinusitis: - Patient is experiencing rather classic vertigo, when turning her head to the left. She does have some signs and symptoms of  sinusitis as well. - Patient was encouraged to start Flonase - CBC - COMPLETE METABOLIC PANEL WITH GFR  Hypothyroidism, unspecified type Managed by Dr. Dwyane Dee, endocrine, Synthroid 100 g prescribed. Patient wants all namebrand, no generics.  . Return in about 4 weeks (around 01/17/2017) for HTN/vertigo.   Note is dictated utilizing voice recognition software. Although note has been proof read prior to signing, occasional typographical errors still can be missed. If any questions arise, please do not hesitate to call for verification.  Electronically signed by: Howard Pouch, DO [place]

## 2016-12-21 ENCOUNTER — Telehealth: Payer: Self-pay | Admitting: Family Medicine

## 2016-12-21 LAB — COMPLETE METABOLIC PANEL WITH GFR
ALT: 13 U/L (ref 6–29)
AST: 14 U/L (ref 10–35)
Albumin: 4.5 g/dL (ref 3.6–5.1)
Alkaline Phosphatase: 83 U/L (ref 33–130)
BUN: 10 mg/dL (ref 7–25)
CO2: 25 mmol/L (ref 20–32)
Calcium: 9.4 mg/dL (ref 8.6–10.4)
Chloride: 103 mmol/L (ref 98–110)
Creat: 0.69 mg/dL (ref 0.60–0.93)
GFR, Est African American: >89 mL/min (ref >=60)
GFR, Est Non African American: 88 mL/min (ref >=60)
GLUCOSE: 95 mg/dL (ref 65–99)
POTASSIUM: 4.1 mmol/L (ref 3.5–5.3)
SODIUM: 140 mmol/L (ref 135–146)
Total Bilirubin: 0.5 mg/dL (ref 0.2–1.2)
Total Protein: 7.3 g/dL (ref 6.1–8.1)

## 2016-12-21 LAB — CBC
HCT: 41.4 % (ref 35.0–45.0)
HEMOGLOBIN: 13.5 g/dL (ref 11.7–15.5)
MCH: 29.2 pg (ref 27.0–33.0)
MCHC: 32.6 g/dL (ref 32.0–36.0)
MCV: 89.6 fL (ref 80.0–100.0)
MPV: 10.5 fL (ref 7.5–12.5)
Platelets: 309 10*3/uL (ref 140–400)
RBC: 4.62 MIL/uL (ref 3.80–5.10)
RDW: 14.2 % (ref 11.0–15.0)
WBC: 6 10*3/uL (ref 3.8–10.8)

## 2016-12-21 NOTE — Telephone Encounter (Signed)
Patient notified and verbalized understanding. 

## 2016-12-21 NOTE — Telephone Encounter (Signed)
Please call pt: - her labs are stable. No infection, anemia or electrolyte imbalances to cause her symptoms. I believe she is experiencing positional vertigo, which was discussed with her yesterday, along with AVS education and information on vertigo.  - She was asked to follow up in 3-4 weeks for hypertension, since we made changes to her medications.

## 2016-12-22 ENCOUNTER — Encounter: Payer: Self-pay | Admitting: Family Medicine

## 2016-12-22 DIAGNOSIS — I10 Essential (primary) hypertension: Secondary | ICD-10-CM | POA: Insufficient documentation

## 2016-12-22 DIAGNOSIS — E039 Hypothyroidism, unspecified: Secondary | ICD-10-CM | POA: Insufficient documentation

## 2017-01-12 ENCOUNTER — Ambulatory Visit (INDEPENDENT_AMBULATORY_CARE_PROVIDER_SITE_OTHER): Payer: BLUE CROSS/BLUE SHIELD | Admitting: Family Medicine

## 2017-01-12 ENCOUNTER — Encounter: Payer: Self-pay | Admitting: Family Medicine

## 2017-01-12 VITALS — BP 127/80 | HR 72 | Temp 98.3°F | Resp 20 | Ht 63.0 in | Wt 182.5 lb

## 2017-01-12 DIAGNOSIS — I10 Essential (primary) hypertension: Secondary | ICD-10-CM

## 2017-01-12 DIAGNOSIS — R42 Dizziness and giddiness: Secondary | ICD-10-CM | POA: Diagnosis not present

## 2017-01-12 MED ORDER — HYDROCHLOROTHIAZIDE 25 MG PO TABS: 25.0000 mg | ORAL_TABLET | Freq: Every day | ORAL | 1 refills | Status: DC

## 2017-01-12 MED ORDER — AMLODIPINE BESYLATE 10 MG PO TABS: 10.0000 mg | ORAL_TABLET | Freq: Every day | ORAL | 1 refills | Status: DC

## 2017-01-12 NOTE — Patient Instructions (Addendum)
Your blood pressure is much improved.   Continue the Flonase and hydration to help with vertigo.  If not resolved or worsening, and you desire further workup then please make appt to discuss options.   Follow up on hypertension in 6 months.   Please help [place] help you:  We are honored you have chosen [facility] for your Primary Care home. Below you will find basic instructions that you may need to access in the future. Please help [place] help you by reading the instructions, which cover many of the frequent questions we experience.   Prescription refills and request:  -In order to allow more efficient response time, please call your pharmacy for all refills. They will forward the request electronically to [place]. This allows for the quickest possible response. Request left on a nurse line can take longer to refill, since these are checked as time allows between office patients and other phone calls.  - refill request can take up to 3-5 working days to complete.  - If request is sent electronically and request is appropiate, it is usually completed in 1-2 business days.  - all patients will need to be seen routinely for all chronic medical conditions requiring prescription medications (see follow-up below). If you are overdue for follow up on your condition, you will be asked to make an appointment and we will call in enough medication to cover you until your appointment (up to 30 days).  - all controlled substances will require a face to face visit to request/refill.  - if you desire your prescriptions to go through a new pharmacy, and have an active script at original pharmacy, you will need to call your pharmacy and have scripts transferred to new pharmacy. This is completed between the pharmacy locations and not by your provider.    Results: If any images or labs were ordered, it can take up to 1 week to get results depending on the test ordered and the lab/facility running and resulting the  test. - Normal or stable results, which do not need further discussion, may be released to your mychart immediately with attached note to you. A call may not be generated for normal results. Please make certain to sign up for mychart. If you have questions on how to activate your mychart you can call the front office.  - If your results need further discussion, our office will attempt to contact you via phone, and if unable to reach you after 2 attempts, we will release your abnormal result to your mychart with instructions.  - All results will be automatically released in mychart after 1 week.  - Your provider will provide you with explanation and instruction on all relevant material in your results. Please keep in mind, results and labs may appear confusing or abnormal to the untrained eye, but it does not mean they are actually abnormal for you personally. If you have any questions about your results that are not covered, or you desire more detailed explanation than what was provided, you should make an appointment with your provider to do so.   Our office handles many outgoing and incoming calls daily. If we have not contacted you within 1 week about your results, please check your mychart to see if there is a message first and if not, then contact our office.  In helping with this matter, you help decrease call volume, and therefore allow [place] to be able to respond to patients needs more efficiently.   Acute  office visits (sick visit):  An acute visit is intended for a new problem and are scheduled in shorter time slots to allow schedule openings for patients with new problems. This is the appropriate visit to discuss a new problem. In order to provide you with excellent quality medical care with proper time for you to explain your problem, have an exam and receive treatment with instructions, these appointments should be limited to one new problem per visit. If you experience a new problem, in which you  desire to be addressed, please make an acute office visit, we save openings on the schedule to accommodate you. Please do not save your new problem for any other type of visit, let us take care of it properly and quickly for you.   Follow up visits:  Depending on your condition(s) your provider will need to see you routinely in order to provide you with quality care and prescribe medication(s). Most chronic conditions (Example: hypertension, Diabetes, depression/anxiety... etc), require visits a couple times a year. Your provider will instruct you on proper follow up for your personal medical conditions and history. Please make certain to make follow up appointments for your condition as instructed. Failing to do so could result in lapse in your medication treatment/refills. If you request a refill, and are overdue to be seen on a condition, we will always provide you with a 30 day script (once) to allow you time to schedule.    Medicare wellness (well visit): - we have a wonderful Nurse Maudie Mercury), that will meet with you and provide you will yearly medicare wellness visits. These visits should occur yearly (can not be scheduled less than 1 calendar year apart) and cover preventive health, immunizations, advance directives and screenings you are entitled to yearly through your medicare benefits. Do not miss out on your entitled benefits, this is when medicare will pay for these benefits to be ordered for you.  These are strongly encouraged by your provider and is the appropriate type of visit to make certain you are up to date with all preventive health benefits. If you have not had your medicare wellness exam in the last 12 months, please make certain to schedule one by calling the office and schedule your medicare wellness with Maudie Mercury as soon as possible.   Yearly physical (well visit):  - Adults are recommended to be seen yearly for physicals. Check with your insurance and date of your last physical, most  insurances require one calendar year between physicals. Physicals include all preventive health topics, screenings, medical exam and labs that are appropriate for gender/age and history. You may have fasting labs needed at this visit. This is a well visit (not a sick visit), new problems should not be covered during this visit (see acute visit).  - Pediatric patients are seen more frequently when they are younger. Your provider will advise you on well child visit timing that is appropriate for your their age. - This is not a medicare wellness visit. Medicare wellness exams do not have an exam portion to the visit. Some medicare companies allow for a physical, some do not allow a yearly physical. If your medicare allows a yearly physical you can schedule the medicare wellness with our nurse Maudie Mercury and have your physical with your provider after, on the same day. Please check with insurance for your full benefits.   Late Policy/No Shows:  - all new patients should arrive 15-30 minutes earlier than appointment to allow [place] time  to  obtain all personal demographics,  insurance information and for you to complete office paperwork. - All established patients should arrive 10-15 minutes earlier than appointment time to update all information and be checked in .  - In our best efforts to run on time, if you are late for your appointment you will be asked to either reschedule or if able, we will work you back into the schedule. There will be a wait time to work you back in the schedule,  depending on availability.  - If you are unable to make it to your appointment as scheduled, please call 24 hours ahead of time to allow [place] to fill the time slot with someone else who needs to be seen. If you do not cancel your appointment ahead of time, you may be charged a no show fee.

## 2017-01-12 NOTE — Progress Notes (Signed)
Patient ID: Alexis Beard, female  DOB: Aug 08, 1945, 71 y.o.   MRN: [id number] Patient Care Team    Relationship Specialty Notifications Start End  Ma Hillock, DO PCP - General Family Medicine  12/20/16   Rolm Bookbinder, MD Consulting Physician Dermatology  12/22/16   Elayne Snare, MD Consulting Physician Endocrinology  12/22/16     Chief Complaint  Patient presents with  . Hypertension    Subjective:  Alexis Beard is a 71 y.o.  female present for HTN follow up after change in medication therapy.  Hypertension: Pt reports compliance with amlodipine 10 mg daily and HCTZ 25 mg which was added on 4 weeks ago.  She had also been prescribed ramipril in the past, but she states she hasn't taken this in a few years because she was worried it was going to make her dizzy. Blood pressures ranges at home not checked. Patient denies chest pain, shortness of breath, dizziness or lower extremity edema.  Pt does not take a daily baby ASA. Pt is not  prescribed statin. BMP: 12/20/2016 CBC: 12/20/2016 Lipid: 06/25/2016 total cholesterol 203, HDL 82, LDL 106, triglycerides 72 Diet: Low-sodium diet  Exercise: Not routinely RF: Hypertension, family history present, obesity  Dizziness: Patient reports dizziness has improved with the use of Flonase and concentrating on remaining hydrated. She still has had a few intermittent episodes of dizziness, but they are infrequent. They are always when turning her head to the left. They only last a few seconds.   Depression screen [facility] 2/9 01/12/2017 12/20/2016  Decreased Interest 0 0  Down, Depressed, Hopeless 0 0  PHQ - 2 Score 0 0   No flowsheet data found.   Fall Risk  12/20/2016  Falls in the past year? No    Immunization History  Administered Date(s) Administered  . Pneumococcal Conjugate-13 08/25/2015  . Tdap 08/27/2012    No exam data present  Past Medical History:  Diagnosis Date  . Anemia   . Arthritis   . Basal cell carcinoma (BCC)    Basal  cell Carcinoma  . Cataract   . Colon polyps   . Colon polyps   . Diverticulosis   . Hypertension   . Hypothyroid   . Migraines   . Osteopenia    Allergies  Allergen Reactions  . Codeine Nausea And Vomiting   Past Surgical History:  Procedure Laterality Date  . BASAL CELL CARCINOMA EXCISION  09/2015   Back  . birth mark removal  1998   on (R) side of neck  . CARPAL TUNNEL RELEASE  1998  . TONSILLECTOMY  1956   Family History  Problem Relation Age of Onset  . Diabetes Mother   . Thyroid disease Mother   . Heart disease Mother 40  . Hyperlipidemia Father   . Heart disease Father 45  . Hyperlipidemia Sister   . Cancer Maternal Aunt        Breast Cancer  . Thyroid disease Maternal Aunt   . Melanoma Maternal Aunt    Social History   Social History  . Marital status: Married    Spouse name: Jeneen Rinks  . Number of children: 3  . Years of education: 12   Occupational History  . Controller    Social History Main Topics  . Smoking status: Never Smoker  . Smokeless tobacco: Never Used  . Alcohol use No  . Drug use: No  . Sexual activity: No   Other Topics Concern  . Not  on file   Social History Narrative   Married to [place]y, has children.   High school graduate.   Drinks caffeine.   Wears her seatbelt, wears a bicycle helmet.   Wears glasses.   Smoke detector in the home, firearms locked in the home.   Feels safe in her relationships.   Right handed.   Allergies as of 01/12/2017      Reactions   Codeine Nausea And Vomiting      Medication List       Accurate as of 01/12/17 10:24 AM. Always use your most recent med list.          amLODipine 10 MG tablet Commonly known as:  NORVASC Take 1 tablet (10 mg total) by mouth daily.   hydrochlorothiazide 25 MG tablet Commonly known as:  HYDRODIURIL Take 1 tablet (25 mg total) by mouth daily.   SYNTHROID 100 MCG tablet Generic drug:  levothyroxine TAKE 1 TABLET EVERY DAY BEFORE BREAKFAST   Vitamin D  (Ergocalciferol) 50000 units Caps capsule Commonly known as:  DRISDOL TAKE 1 CAPSULE (50,000 UNITS TOTAL) BY MOUTH EVERY 7 (SEVEN) DAYS.       All past medical history, surgical history, allergies, family history, immunizations andmedications were updated in the EMR today and reviewed under the history and medication portions of their EMR.    Recent Results (from the past 2160 hour(s))  CBC     Status: None   Collection Time: 12/20/16 10:45 AM  Result Value Ref Range   WBC 6.0 3.8 - 10.8 K/uL   RBC 4.62 3.80 - 5.10 MIL/uL   Hemoglobin 13.5 11.7 - 15.5 g/dL   HCT 41.4 35.0 - 45.0 %   MCV 89.6 80.0 - 100.0 fL   MCH 29.2 27.0 - 33.0 pg   MCHC 32.6 32.0 - 36.0 g/dL   RDW 14.2 11.0 - 15.0 %   Platelets 309 140 - 400 K/uL   MPV 10.5 7.5 - 12.5 fL  COMPLETE METABOLIC PANEL WITH GFR     Status: None   Collection Time: 12/20/16 10:45 AM  Result Value Ref Range   Sodium 140 135 - 146 mmol/L   Potassium 4.1 3.5 - 5.3 mmol/L   Chloride 103 98 - 110 mmol/L   CO2 25 20 - 32 mmol/L    Comment: ** Please note change in reference range(s). **      Glucose, Bld 95 65 - 99 mg/dL   BUN 10 7 - 25 mg/dL   Creat 0.69 0.60 - 0.93 mg/dL    Comment:   For patients > or = 71 years of age: The upper reference limit for Creatinine is approximately 13% higher for people identified as African-American.      Total Bilirubin 0.5 0.2 - 1.2 mg/dL   Alkaline Phosphatase 83 33 - 130 U/L   AST 14 10 - 35 U/L   ALT 13 6 - 29 U/L   Total Protein 7.3 6.1 - 8.1 g/dL   Albumin 4.5 3.6 - 5.1 g/dL   Calcium 9.4 8.6 - 10.4 mg/dL   GFR, Est African American >89 >=60 mL/min   GFR, Est Non African American 88 >=60 mL/min    No results found.   ROS: 14 pt review of systems performed and negative (unless mentioned in an HPI)  Objective: BP 127/80 (BP Location: Right Arm, Patient Position: Sitting, Cuff Size: Large)   Pulse 72   Temp 98.3 F (36.8 C)   Resp 20   Ht 5'  3" (1.6 m)   Wt 182 lb 8 oz (82.8  kg)   SpO2 100%   BMI 32.33 kg/m  Gen: Afebrile. No acute distress.  HENT: AT. .  MMM. B Eyes:Pupils Equal Round Reactive to light, Extraocular movements intact,  Conjunctiva without redness, discharge or icterus. CV: RRR no murmur, no edema, +2/4 P posterior tibialis pulses Chest: CTAB, no wheeze or crackles  Neuro:  Normal gait. PERLA. EOMi. Alert. Oriented x3   Assessment/plan: Alexis Beard is a 71 y.o. female present for establish care.  Essential hypertension - Stable today. Improved with addition of HCTZ. Continue amlodipine 10 mg daily and HCTZ 25 mg daily.   - Low-sodium, exercise.  - Follow-up 6 months  Vertigo: - Improved with the use of Flonase, and concentrating on hydration. Still having very few occasions of vertigo with turning her head to the left. Discussed options of vestibular rehabilitation with her today. She would like to wait a few more weeks and see how she does and she is getting improvement.  - Follow-up as needed on vertigo. No Follow-up on file.   Note is dictated utilizing voice recognition software. Although note has been proof read prior to signing, occasional typographical errors still can be missed. If any questions arise, please do not hesitate to call for verification.  Electronically signed by: Howard Pouch, DO [place]

## 2017-01-18 ENCOUNTER — Ambulatory Visit: Payer: BLUE CROSS/BLUE SHIELD | Admitting: Family Medicine

## 2017-03-09 ENCOUNTER — Other Ambulatory Visit: Payer: Self-pay

## 2017-03-09 MED ORDER — SYNTHROID 100 MCG PO TABS: ORAL_TABLET | ORAL | 0 refills | Status: DC

## 2017-04-05 ENCOUNTER — Other Ambulatory Visit: Payer: Self-pay | Admitting: Endocrinology

## 2017-06-30 ENCOUNTER — Ambulatory Visit (INDEPENDENT_AMBULATORY_CARE_PROVIDER_SITE_OTHER): Payer: BLUE CROSS/BLUE SHIELD | Admitting: Endocrinology

## 2017-06-30 ENCOUNTER — Encounter: Payer: Self-pay | Admitting: Endocrinology

## 2017-06-30 VITALS — BP 130/86 | HR 78 | Ht 63.0 in | Wt 181.2 lb

## 2017-06-30 DIAGNOSIS — R7303 Prediabetes: Secondary | ICD-10-CM

## 2017-06-30 DIAGNOSIS — E039 Hypothyroidism, unspecified: Secondary | ICD-10-CM | POA: Diagnosis not present

## 2017-06-30 DIAGNOSIS — I10 Essential (primary) hypertension: Secondary | ICD-10-CM

## 2017-06-30 DIAGNOSIS — E559 Vitamin D deficiency, unspecified: Secondary | ICD-10-CM | POA: Diagnosis not present

## 2017-06-30 LAB — TSH: TSH: 0.51 u[IU]/mL (ref 0.35–4.50)

## 2017-06-30 LAB — COMPREHENSIVE METABOLIC PANEL
ALT: 13 U/L (ref 0–35)
AST: 15 U/L (ref 0–37)
Albumin: 4.4 g/dL (ref 3.5–5.2)
Alkaline Phosphatase: 75 U/L (ref 39–117)
BILIRUBIN TOTAL: 0.6 mg/dL (ref 0.2–1.2)
BUN: 13 mg/dL (ref 6–23)
CALCIUM: 9.6 mg/dL (ref 8.4–10.5)
CHLORIDE: 101 meq/L (ref 96–112)
CO2: 33 mEq/L — ABNORMAL HIGH (ref 19–32)
CREATININE: 0.78 mg/dL (ref 0.40–1.20)
GFR: 77.29 mL/min (ref >60.00)
Glucose, Bld: 101 mg/dL — ABNORMAL HIGH (ref 70–99)
Potassium: 4.3 mEq/L (ref 3.5–5.1)
SODIUM: 139 meq/L (ref 135–145)
Total Protein: 7.6 g/dL (ref 6.0–8.3)

## 2017-06-30 LAB — HEMOGLOBIN A1C: HEMOGLOBIN A1C: 5.7 % (ref 4.6–6.5)

## 2017-06-30 LAB — T4, FREE: FREE T4: 1.11 ng/dL (ref 0.60–1.60)

## 2017-06-30 LAB — LIPID PANEL
CHOL/HDL RATIO: 3
CHOLESTEROL: 200 mg/dL (ref 0–200)
HDL: 75.7 mg/dL (ref >39.00)
LDL Cholesterol: 107 mg/dL — ABNORMAL HIGH (ref 0–99)
NonHDL: 124.78
TRIGLYCERIDES: 88 mg/dL (ref 0.0–149.0)
VLDL: 17.6 mg/dL (ref 0.0–40.0)

## 2017-06-30 LAB — VITAMIN D 25 HYDROXY (VIT D DEFICIENCY, FRACTURES): VITD: 63.24 ng/mL (ref 30.00–100.00)

## 2017-06-30 NOTE — Progress Notes (Signed)
Patient ID: Alexis Beard, female   DOB: [birth year], 72 y.o.   MRN: [id number]   Chief complaint:  Followup of thyroid and other problems  History of Present Illness:    Hypertension: she has had long-standing hypertension.      She was previously on amlodipine and ramipril although last year had stopped ramipril on her own Also when she saw her PCP in August last year because of some swelling she was switched from ramipril to HCTZ 25 mg daily  Blood pressure is higher than usual today  Does not monitor at home     She has had primary HYPOTHYROIDISM since about 1995 and previously had been on doses between 88 and 112 g  TSH was high in 4/16 and she is since then taking 100 g daily of brand name Synthroid  She has been taking her Synthroid consistently in the mornings.  No recent unusual fatigue or cold intolerance  TSH is consistently normal, needs follow-up  Lab Results  Component Value Date   TSH 0.79 06/25/2016   TSH 0.73 12/25/2015   TSH 1.11 08/20/2015   FREET4 0.96 06/25/2016   FREET4 0.92 04/02/2014   FREET4 1.13 12/07/2013      Her cholesterol has been periodically high  Usually her LDL has been near 100  and has not been on any Statin drugs Her mother did have coronary bypass surgery but she also had apparently severe diabetes   No recent labs available     Lab Results  Component Value Date   CHOL 203 (H) 06/25/2016   HDL 82.10 06/25/2016   LDLCALC 106 (H) 06/25/2016   LDLDIRECT 135.4 05/02/2013   TRIG 72.0 06/25/2016   CHOLHDL 2 06/25/2016      She has a history of prediabetes but Fasting blood sugar has been Usually consistently normal  Labs pending      Wt Readings from Last 3 Encounters:  06/30/17 181 lb 3.2 oz (82.2 kg)  01/12/17 182 lb 8 oz (82.8 kg)  12/20/16 185 lb 8 oz (84.1 kg)    Lab Results  Component Value Date   HGBA1C 5.9 06/25/2016   HGBA1C 5.7 12/25/2015   HGBA1C 5.7 02/19/2015   Lab Results    Component Value Date   LDLCALC 106 (H) 06/25/2016   CREATININE 0.69 12/20/2016      OSTEOPOROSIS:   See review of systems   Vitamin D deficiency: She has been prescribed  50,000 units and  has taken it once a month as directed.   Needs follow-up level     Allergies as of 06/30/2017      Reactions   Codeine Nausea And Vomiting      Medication List        Accurate as of 06/30/17 10:24 AM. Always use your most recent med list.          amLODipine 10 MG tablet Commonly known as:  NORVASC Take 1 tablet (10 mg total) by mouth daily.   hydrochlorothiazide 25 MG tablet Commonly known as:  HYDRODIURIL Take 1 tablet (25 mg total) by mouth daily.   SYNTHROID 100 MCG tablet Generic drug:  levothyroxine TAKE 1 TABLET EVERY DAY BEFORE BREAKFAST   Vitamin D (Ergocalciferol) 50000 units Caps capsule Commonly known as:  DRISDOL TAKE 1 CAPSULE (50,000 UNITS TOTAL) BY MOUTH EVERY 7 (SEVEN) DAYS.       Allergies:  Allergies  Allergen Reactions  . Codeine Nausea And Vomiting    Past Medical History:  Diagnosis Date  . Anemia   . Arthritis   . Basal cell carcinoma (BCC)    Basal cell Carcinoma  . Cataract   . Colon polyps   . Colon polyps   . Diverticulosis   . Hypertension   . Hypothyroid   . Migraines   . Osteopenia     Past Surgical History:  Procedure Laterality Date  . BASAL CELL CARCINOMA EXCISION  09/2015   Back  . birth mark removal  1998   on (R) side of neck  . CARPAL TUNNEL RELEASE  1998  . TONSILLECTOMY  1956    Family History  Problem Relation Age of Onset  . Diabetes Mother   . Thyroid disease Mother   . Heart disease Mother 68  . Hyperlipidemia Father   . Heart disease Father 84  . Hyperlipidemia Sister   . Cancer Maternal Aunt        Breast Cancer  . Thyroid disease Maternal Aunt   . Melanoma Maternal Aunt     Social History:  reports that  has never smoked. she has never used smokeless tobacco. She reports that she does not  drink alcohol or use drugs.  Review of Systems     OSTEOPENIA with T score on the femoral neck -1.5 in 9/08.  Original height 5'5".   Previous results on 1/16 shows T score of the right femur to be -2.1 and at the left it is -1.6 T score at the wrist is -3.2, previously - 3.6  Bone density on 07/22/16: shows T score of the right femur to be -2.2, stable and at the left it is -1.5, 2% increase  T score at the wrist is -3.4, previously -3.2 Overall stable  Has been prescribed Evista on each visit but she refuses to take it for fear of side effects She thinks she may have had bad dreams with this  She also refused to take Reclast on her last visit    EXAM:  BP 130/86 (BP Location: Left Arm, Patient Position: Sitting, Cuff Size: Normal)   Pulse 78   Ht 5\' 3"  (1.6 m)   Wt 181 lb 3.2 oz (82.2 kg)   SpO2 97%   BMI 32.10 kg/m   Repeat blood pressure 140/88 No peripheral edema  Assessment/Plan:   HYPOTHYROIDISM, long-standing: She is clinically doing well with current regimen of Synthroid 100 g Her periodic fatigue likely to be related to her sleep difficulties TSH is pending   HYPERTENSION: blood pressure is much higher than usual  Previously was consistently controlled  with ramipril 2.5 mg and Norvasc 10 mg She is now on HCTZ instead of ramipril and has not had follow-up with her PCP since medication was changed  However she will be seeing the PCP back later this month, likely needs to go back to ramipril    OSTEOPOROSIS: Since she has a significantly low bone density at the wrist  Again refuses any medications Her bone density at the hip showed osteopenia which is stable however  Will reassess in one year and if she is having worsening may consider Reclast again Also will do vitamin D level today  Hyperlipidemia: Controlled on diet alone in the past.  To have labs today  History of impaired fasting glucose: To have labs today including A1c  Elayne Snare 06/30/2017, 10:24 AM   Note: This office note was prepared with Dragon voice recognition system technology. Any transcriptional errors that result from this process are unintentional.

## 2017-07-04 ENCOUNTER — Telehealth: Payer: Self-pay

## 2017-07-04 NOTE — Telephone Encounter (Signed)
-----   Message from Elayne Snare, MD sent at 06/30/2017  9:19 PM EST ----- Her sugar level was just over 100, needs to work on weight loss.  Vitamin D level is high normal and she can take the supplement only twice a month, thyroid okay and continue the same dose

## 2017-07-04 NOTE — Telephone Encounter (Signed)
Called pt. No answer. Left vmail per DPR.

## 2017-07-12 ENCOUNTER — Ambulatory Visit: Payer: BLUE CROSS/BLUE SHIELD | Admitting: Family Medicine

## 2017-07-20 ENCOUNTER — Ambulatory Visit: Payer: BLUE CROSS/BLUE SHIELD | Admitting: Family Medicine

## 2017-07-20 ENCOUNTER — Encounter: Payer: Self-pay | Admitting: Family Medicine

## 2017-07-20 VITALS — BP 121/74 | HR 63 | Temp 98.0°F | Resp 20 | Ht 63.0 in | Wt 181.0 lb

## 2017-07-20 DIAGNOSIS — E785 Hyperlipidemia, unspecified: Secondary | ICD-10-CM

## 2017-07-20 DIAGNOSIS — R7303 Prediabetes: Secondary | ICD-10-CM | POA: Diagnosis not present

## 2017-07-20 DIAGNOSIS — I10 Essential (primary) hypertension: Secondary | ICD-10-CM | POA: Diagnosis not present

## 2017-07-20 DIAGNOSIS — E039 Hypothyroidism, unspecified: Secondary | ICD-10-CM

## 2017-07-20 MED ORDER — HYDROCHLOROTHIAZIDE 25 MG PO TABS: 25.0000 mg | ORAL_TABLET | Freq: Every day | ORAL | 1 refills | Status: DC

## 2017-07-20 MED ORDER — AMLODIPINE BESYLATE 10 MG PO TABS: 10.0000 mg | ORAL_TABLET | Freq: Every day | ORAL | 1 refills | Status: DC

## 2017-07-20 MED ORDER — SYNTHROID 100 MCG PO TABS: ORAL_TABLET | ORAL | 3 refills | Status: DC

## 2017-07-20 NOTE — Progress Notes (Signed)
Patient ID: Alexis Beard, female  DOB: 1945-12-14, 72 y.o.   MRN: [id number] Patient Care Team    Relationship Specialty Notifications Start End  Ma Hillock, DO PCP - General Family Medicine  12/20/16   Rolm Bookbinder, MD Consulting Physician Dermatology  12/22/16   Elayne Snare, MD Consulting Physician Endocrinology  12/22/16     Chief Complaint  Patient presents with  . Hypertension    Subjective:  Alexis Beard is a 72 y.o.  female present for HTN follow up after change in medication therapy.  Hypertension/morbid obesity: Pt reports compliance with amlodipine 10 mg daily and HCTZ 25 mg a day.  She had also been prescribed ramipril in the past, but she states she hasn't taken this in a few years because she was worried it was going to make her dizzy, she does not want to restart ramipril. Patient denies chest pain, shortness of breath, dizziness or lower extremity edema.  Pt does not take a daily baby ASA. Pt is not  prescribed statin. BMP: 06/30/2017 within normal limits CBC: 07/28/2017 within normal limits Lipid: 06/30/2017 total cholesterol 200 HDL 35, LDL 107, triglycerides 80 Diet: Low-sodium diet  Exercise: Not routinely RF: Hypertension, family history present, obesity   Prediabetes Last A1c 07/05/2016 5.9, patient had labs repeated her endocrinologist 06/30/2017 with an A1c of 5.7.  Acquired hypothyroidism Patient has been seen by endocrinology recent TSH 0.51, T4 1 0.11 on 06/30/2017. She reports compliance with brand-name Synthroid 100 g daily.    Depression screen [facility] 2/9 01/12/2017 12/20/2016  Decreased Interest 0 0  Down, Depressed, Hopeless 0 0  PHQ - 2 Score 0 0   No flowsheet data found.   Fall Risk  12/20/2016  Falls in the past year? No    Immunization History  Administered Date(s) Administered  . Pneumococcal Conjugate-13 08/25/2015  . Tdap 08/27/2012    No exam data present  Past Medical History:  Diagnosis Date  . Anemia   . Arthritis     . Basal cell carcinoma (BCC)    Basal cell Carcinoma  . Cataract   . Colon polyps   . Colon polyps   . Diverticulosis   . Hypertension   . Hypothyroid   . Migraines   . Osteopenia    Allergies  Allergen Reactions  . Codeine Nausea And Vomiting   Past Surgical History:  Procedure Laterality Date  . BASAL CELL CARCINOMA EXCISION  09/2015   Back  . birth mark removal  1998   on (R) side of neck  . CARPAL TUNNEL RELEASE  1998  . TONSILLECTOMY  1956   Family History  Problem Relation Age of Onset  . Diabetes Mother   . Thyroid disease Mother   . Heart disease Mother 84  . Hyperlipidemia Father   . Heart disease Father 22  . Hyperlipidemia Sister   . Cancer Maternal Aunt        Breast Cancer  . Thyroid disease Maternal Aunt   . Melanoma Maternal Aunt    Social History   Socioeconomic History  . Marital status: Alexis    Spouse name: Jeneen Rinks  . Number of children: 3  . Years of education: 36  . Highest education level: Not on file  Social Needs  . Financial resource strain: Not on file  . Food insecurity - worry: Not on file  . Food insecurity - inability: Not on file  . Transportation needs - medical: Not on file  .  Transportation needs - non-medical: Not on file  Occupational History  . Occupation: Controller  Tobacco Use  . Smoking status: Never Smoker  . Smokeless tobacco: Never Used  Substance and Sexual Activity  . Alcohol use: No  . Drug use: No  . Sexual activity: No    Partners: Male    Birth control/protection: Condom  Other Topics Concern  . Not on file  Social History Narrative   Alexis Beard, has children.   High school graduate.   Drinks caffeine.   Wears her seatbelt, wears a bicycle helmet.   Wears glasses.   Smoke detector in the home, firearms locked in the home.   Feels safe in her relationships.   Right handed.   Allergies as of 07/20/2017      Reactions   Codeine Nausea And Vomiting      Medication List        Accurate  as of 07/20/17  1:17 PM. Always use your most recent med list.          amLODipine 10 MG tablet Commonly known as:  NORVASC Take 1 tablet (10 mg total) by mouth daily.   hydrochlorothiazide 25 MG tablet Commonly known as:  HYDRODIURIL Take 1 tablet (25 mg total) by mouth daily.   SYNTHROID 100 MCG tablet Generic drug:  levothyroxine TAKE 1 TABLET EVERY DAY BEFORE BREAKFAST   Vitamin D (Ergocalciferol) 50000 units Caps capsule Commonly known as:  DRISDOL TAKE 1 CAPSULE (50,000 UNITS TOTAL) BY MOUTH EVERY 7 (SEVEN) DAYS.       All past medical history, surgical history, allergies, family history, immunizations andmedications were updated in the EMR today and reviewed under the history and medication portions of their EMR.    Recent Results (from the past 2160 hour(s))  Hemoglobin A1c     Status: None   Collection Time: 06/30/17 10:41 AM  Result Value Ref Range   Hgb A1c MFr Bld 5.7 4.6 - 6.5 %    Comment: Glycemic Control Guidelines for People with Diabetes:Non Diabetic:  <6%Goal of Therapy: <7%Additional Action Suggested:  >8%   T4, free     Status: None   Collection Time: 06/30/17 10:41 AM  Result Value Ref Range   Free T4 1.11 0.60 - 1.60 ng/dL    Comment: Specimens from patients who are undergoing biotin therapy and /or ingesting biotin supplements may contain high levels of biotin.  The higher biotin concentration in these specimens interferes with this Free T4 assay.  Specimens that contain high levels  of biotin may cause false high results for this Free T4 assay.  Please interpret results in light of the total clinical presentation of the patient.    TSH     Status: None   Collection Time: 06/30/17 10:41 AM  Result Value Ref Range   TSH 0.51 0.35 - 4.50 uIU/mL  Lipid panel     Status: Abnormal   Collection Time: 06/30/17 10:41 AM  Result Value Ref Range   Cholesterol 200 0 - 200 mg/dL    Comment: ATP III Classification       Desirable:  < 200 mg/dL                Borderline High:  200 - 239 mg/dL          High:  > = 240 mg/dL   Triglycerides 88.0 0.0 - 149.0 mg/dL    Comment: Normal:  <150 mg/dLBorderline High:  150 - 199 mg/dL   HDL 75.70 >  39.00 mg/dL   VLDL 17.6 0.0 - 40.0 mg/dL   LDL Cholesterol 107 (H) 0 - 99 mg/dL   Total CHOL/HDL Ratio 3     Comment:                Men          Women1/2 Average Risk     3.4          3.3Average Risk          5.0          4.42X Average Risk          9.6          7.13X Average Risk          15.0          11.0                       NonHDL 124.78     Comment: NOTE:  Non-HDL goal should be 30 mg/dL higher than patient's LDL goal (i.e. LDL goal of < 70 mg/dL, would have non-HDL goal of < 100 mg/dL)  Comprehensive metabolic panel     Status: Abnormal   Collection Time: 06/30/17 10:41 AM  Result Value Ref Range   Sodium 139 135 - 145 mEq/L   Potassium 4.3 3.5 - 5.1 mEq/L   Chloride 101 96 - 112 mEq/L   CO2 33 (H) 19 - 32 mEq/L   Glucose, Bld 101 (H) 70 - 99 mg/dL   BUN 13 6 - 23 mg/dL   Creatinine, Ser 0.78 0.40 - 1.20 mg/dL   Total Bilirubin 0.6 0.2 - 1.2 mg/dL   Alkaline Phosphatase 75 39 - 117 U/L   AST 15 0 - 37 U/L   ALT 13 0 - 35 U/L   Total Protein 7.6 6.0 - 8.3 g/dL   Albumin 4.4 3.5 - 5.2 g/dL   Calcium 9.6 8.4 - 10.5 mg/dL   GFR 77.29 >60.00 mL/min  VITAMIN D 25 Hydroxy (Vit-D Deficiency, Fractures)     Status: None   Collection Time: 06/30/17 10:41 AM  Result Value Ref Range   VITD 63.24 30.00 - 100.00 ng/mL    No results found.   ROS: 14 pt review of systems performed and negative (unless mentioned in an HPI)  Objective: BP 121/74 (BP Location: Right Arm, Patient Position: Sitting, Cuff Size: Large)   Pulse 63   Temp 98 F (36.7 C)   Resp 20   Ht 5\' 3"  (1.6 m)   Wt 181 lb (82.1 kg)   SpO2 97%   BMI 32.06 kg/m  Gen: Afebrile. No acute distress. Nontoxic in appearance, well-developed, well-nourished, Caucasian female. Obese. HENT: AT. [place]. MMM.  Eyes:Pupils Equal Round Reactive to  light, Extraocular movements intact,  Conjunctiva without redness, discharge or icterus. Neck/lymp/endocrine: Supple, no lymphadenopathy, no thyromegaly CV: RRR no murmur, no edema, +2/4 P posterior tibialis pulses Chest: CTAB, no wheeze or crackles Abd: Soft.. NTND. BS present. No Masses palpated.  Skin: No rashes, purpura or petechiae.  Neuro:  Normal gait. PERLA. EOMi. Alert. Oriented x3  Assessment/plan: ZAIYA ANNUNZIATO is a 72 y.o. female present for establish care.  Essential hypertension/HLD/morbid obesity - Stable today. Noted BP mildly elevated at endocrine office, however her BP as been normal at this office. She feels the drive to [place] causes her pressure to rise and going to her endocrine. BP always normal at home. - Continue HCTZ 25 mg and amlodipine  10 mg daily; refills provided today - Low-sodium, exercise.  - Follow-up 6 months  Prediabetes Stable. Continue to follow every 6 months. Diet and exercise recommendations provided.  Acquired hypothyroidism TSH and T4 collected 06/30/2017 within normal limits. Continue Synthroid 100 g daily (brand name only). Refills provided today.   Return in about 6 months (around 01/20/2018) for HTN/predm, HLD/hypothryoid.   Note is dictated utilizing voice recognition software. Although note has been proof read prior to signing, occasional typographical errors still can be missed. If any questions arise, please do not hesitate to call for verification.  Electronically signed by: Howard Pouch, DO [place]

## 2017-07-20 NOTE — Patient Instructions (Signed)
It was a pleasure to see you today.  Make sure to schedule mammogram.   Follow up in 6 months.  Please help [place] help you:  We are honored you have chosen [facility] for your Primary Care home. Below you will find basic instructions that you may need to access in the future. Please help [place] help you by reading the instructions, which cover many of the frequent questions we experience.   Prescription refills and request:  -In order to allow more efficient response time, please call your pharmacy for all refills. They will forward the request electronically to [place]. This allows for the quickest possible response. Request left on a nurse line can take longer to refill, since these are checked as time allows between office patients and other phone calls.  - refill request can take up to 3-5 working days to complete.  - If request is sent electronically and request is appropiate, it is usually completed in 1-2 business days.  - all patients will need to be seen routinely for all chronic medical conditions requiring prescription medications (see follow-up below). If you are overdue for follow up on your condition, you will be asked to make an appointment and we will call in enough medication to cover you until your appointment (up to 30 days).  - all controlled substances will require a face to face visit to request/refill.  - if you desire your prescriptions to go through a new pharmacy, and have an active script at original pharmacy, you will need to call your pharmacy and have scripts transferred to new pharmacy. This is completed between the pharmacy locations and not by your provider.    Results: If any images or labs were ordered, it can take up to 1 week to get results depending on the test ordered and the lab/facility running and resulting the test. - Normal or stable results, which do not need further discussion, may be released to your mychart immediately with attached note to you. A call may  not be generated for normal results. Please make certain to sign up for mychart. If you have questions on how to activate your mychart you can call the front office.  - If your results need further discussion, our office will attempt to contact you via phone, and if unable to reach you after 2 attempts, we will release your abnormal result to your mychart with instructions.  - All results will be automatically released in mychart after 1 week.  - Your provider will provide you with explanation and instruction on all relevant material in your results. Please keep in mind, results and labs may appear confusing or abnormal to the untrained eye, but it does not mean they are actually abnormal for you personally. If you have any questions about your results that are not covered, or you desire more detailed explanation than what was provided, you should make an appointment with your provider to do so.   Our office handles many outgoing and incoming calls daily. If we have not contacted you within 1 week about your results, please check your mychart to see if there is a message first and if not, then contact our office.  In helping with this matter, you help decrease call volume, and therefore allow [place] to be able to respond to patients needs more efficiently.   Acute office visits (sick visit):  An acute visit is intended for a new problem and are scheduled in shorter time slots to allow schedule  openings for patients with new problems. This is the appropriate visit to discuss a new problem. In order to provide you with excellent quality medical care with proper time for you to explain your problem, have an exam and receive treatment with instructions, these appointments should be limited to one new problem per visit. If you experience a new problem, in which you desire to be addressed, please make an acute office visit, we save openings on the schedule to accommodate you. Please do not save your new problem for  any other type of visit, let us take care of it properly and quickly for you.   Follow up visits:  Depending on your condition(s) your provider will need to see you routinely in order to provide you with quality care and prescribe medication(s). Most chronic conditions (Example: hypertension, Diabetes, depression/anxiety... etc), require visits a couple times a year. Your provider will instruct you on proper follow up for your personal medical conditions and history. Please make certain to make follow up appointments for your condition as instructed. Failing to do so could result in lapse in your medication treatment/refills. If you request a refill, and are overdue to be seen on a condition, we will always provide you with a 30 day script (once) to allow you time to schedule.    Medicare wellness (well visit): - we have a wonderful Nurse Maudie Mercury), that will meet with you and provide you will yearly medicare wellness visits. These visits should occur yearly (can not be scheduled less than 1 calendar year apart) and cover preventive health, immunizations, advance directives and screenings you are entitled to yearly through your medicare benefits. Do not miss out on your entitled benefits, this is when medicare will pay for these benefits to be ordered for you.  These are strongly encouraged by your provider and is the appropriate type of visit to make certain you are up to date with all preventive health benefits. If you have not had your medicare wellness exam in the last 12 months, please make certain to schedule one by calling the office and schedule your medicare wellness with Maudie Mercury as soon as possible.   Yearly physical (well visit):  - Adults are recommended to be seen yearly for physicals. Check with your insurance and date of your last physical, most insurances require one calendar year between physicals. Physicals include all preventive health topics, screenings, medical exam and labs that are  appropriate for gender/age and history. You may have fasting labs needed at this visit. This is a well visit (not a sick visit), new problems should not be covered during this visit (see acute visit).  - Pediatric patients are seen more frequently when they are younger. Your provider will advise you on well child visit timing that is appropriate for your their age. - This is not a medicare wellness visit. Medicare wellness exams do not have an exam portion to the visit. Some medicare companies allow for a physical, some do not allow a yearly physical. If your medicare allows a yearly physical you can schedule the medicare wellness with our nurse Maudie Mercury and have your physical with your provider after, on the same day. Please check with insurance for your full benefits.   Late Policy/No Shows:  - all new patients should arrive 15-30 minutes earlier than appointment to allow [place] time  to  obtain all personal demographics,  insurance information and for you to complete office paperwork. - All established patients should arrive 10-15 minutes earlier than  appointment time to update all information and be checked in .  - In our best efforts to run on time, if you are late for your appointment you will be asked to either reschedule or if able, we will work you back into the schedule. There will be a wait time to work you back in the schedule,  depending on availability.  - If you are unable to make it to your appointment as scheduled, please call 24 hours ahead of time to allow [place] to fill the time slot with someone else who needs to be seen. If you do not cancel your appointment ahead of time, you may be charged a no show fee.

## 2017-08-15 ENCOUNTER — Telehealth: Payer: Self-pay | Admitting: *Deleted

## 2017-08-15 NOTE — Telephone Encounter (Signed)
Copied from [place] [id number]. Topic: Inquiry >> Aug 15, 2017 12:22 PM Pricilla Handler wrote: Reason for CRM: Patient called wanting to know if Dr. Raoul Pitch would call in a medication for her. Patient states that she is having Cold/Flu-Like Symptoms, and is too weak to come in for an office visit. Patient would like for Dr. Raoul Pitch to call in a medication for her, that her husband may pick it up from the pharmacy. Please call patient at [phone].     Thank You!!!  Spoke with patient explained to her she would need to be seen for evaluation of her symptoms so Dr Raoul Pitch could treat her appropriately. Patient requested an appt for Wednesday first available. Patient schedule for 8 am appt.

## 2017-08-17 ENCOUNTER — Ambulatory Visit (INDEPENDENT_AMBULATORY_CARE_PROVIDER_SITE_OTHER): Payer: BLUE CROSS/BLUE SHIELD | Admitting: Family Medicine

## 2017-08-17 ENCOUNTER — Encounter: Payer: Self-pay | Admitting: Family Medicine

## 2017-08-17 VITALS — BP 116/81 | HR 68 | Temp 98.6°F | Resp 20 | Ht 63.0 in | Wt 179.0 lb

## 2017-08-17 DIAGNOSIS — J101 Influenza due to other identified influenza virus with other respiratory manifestations: Secondary | ICD-10-CM | POA: Insufficient documentation

## 2017-08-17 DIAGNOSIS — R110 Nausea: Secondary | ICD-10-CM | POA: Diagnosis not present

## 2017-08-17 DIAGNOSIS — R6889 Other general symptoms and signs: Secondary | ICD-10-CM | POA: Diagnosis not present

## 2017-08-17 LAB — POC INFLUENZA A&B (BINAX/QUICKVUE)
Influenza A, POC: POSITIVE — AB
Influenza B, POC: NEGATIVE

## 2017-08-17 MED ORDER — ONDANSETRON HCL 4 MG/2ML IJ SOLN
4.0000 mg | Freq: Once | INTRAMUSCULAR | Status: AC
  Administered 2017-08-17: 4 mg via INTRAMUSCULAR

## 2017-08-17 MED ORDER — ONDANSETRON HCL 4 MG PO TABS
4.0000 mg | ORAL_TABLET | Freq: Three times a day (TID) | ORAL | 0 refills | Status: DC | PRN
Start: 2017-08-17 — End: 2018-01-02

## 2017-08-17 MED ORDER — OSELTAMIVIR PHOSPHATE 75 MG PO CAPS: 75.0000 mg | ORAL_CAPSULE | Freq: Two times a day (BID) | ORAL | 0 refills | Status: DC

## 2017-08-17 MED ORDER — BENZONATATE 200 MG PO CAPS: 200.0000 mg | ORAL_CAPSULE | Freq: Two times a day (BID) | ORAL | 0 refills | Status: DC | PRN

## 2017-08-17 NOTE — Patient Instructions (Addendum)
You have influenza A.  - We gave you shot of Zofran--> antinausea medicine. When you go home, take your thyroid medicine immediately.  Drink water and Gatorade (G2)--> at least 100 ounces of fluid a day.  I have called in Tamiflu for you to take every 12 hours for 5 days.  I have called the zofran (antinausea) pill, start every 8 hours for 2 days to allow hydration, then you can use as needed.  Do not take your blood pressure pills until you are drinking plenty of fluids (1-2 days)  Rest, hydrate.  Follow up in 1 week if not better, sooner if worsening. If not taking in fluid, you will need to go to ED or UC to get IV fluids.    Influenza, Adult Influenza ("the flu") is an infection in the lungs, nose, and throat (respiratory tract). It is caused by a virus. The flu causes many common cold symptoms, as well as a high fever and body aches. It can make you feel very sick. The flu spreads easily from person to person (is contagious). Getting a flu shot (influenza vaccination) every year is the best way to prevent the flu. Follow these instructions at home:  Take over-the-counter and prescription medicines only as told by your doctor.  Use a cool mist humidifier to add moisture (humidity) to the air in your home. This can make it easier to breathe.  Rest as needed.  Drink enough fluid to keep your pee (urine) clear or pale yellow.  Cover your mouth and nose when you cough or sneeze.  Wash your hands with soap and water often, especially after you cough or sneeze. If you cannot use soap and water, use hand sanitizer.  Stay home from work or school as told by your doctor. Unless you are visiting your doctor, try to avoid leaving home until your fever has been gone for 24 hours without the use of medicine.  Keep all follow-up visits as told by your doctor. This is important. How is this prevented?  Getting a yearly (annual) flu shot is the best way to avoid getting the flu. You may get the  flu shot in late summer, fall, or winter. Ask your doctor when you should get your flu shot.  Wash your hands often or use hand sanitizer often.  Avoid contact with people who are sick during cold and flu season.  Eat healthy foods.  Drink plenty of fluids.  Get enough sleep.  Exercise regularly. Contact a doctor if:  You get new symptoms.  You have: ? Chest pain. ? Watery poop (diarrhea). ? A fever.  Your cough gets worse.  You start to have more mucus.  You feel sick to your stomach (nauseous).  You throw up (vomit). Get help right away if:  You start to be short of breath or have trouble breathing.  Your skin or nails turn a bluish color.  You have very bad pain or stiffness in your neck.  You get a sudden headache.  You get sudden pain in your face or ear.  You cannot stop throwing up. This information is not intended to replace advice given to you by your health care provider. Make sure you discuss any questions you have with your health care provider. Document Released: 02/10/2008 Document Revised: 10/09/2015 Document Reviewed: 02/25/2015 Elsevier Interactive Patient Education  2017 [employer].

## 2017-08-17 NOTE — Progress Notes (Signed)
Alexis Beard , 1945/12/08, 72 y.o., female MRN: [id number] Patient Care Team    Relationship Specialty Notifications Start End  Ma Hillock, DO PCP - General Family Medicine  12/20/16   Rolm Bookbinder, MD Consulting Physician Dermatology  12/22/16   Elayne Snare, MD Consulting Physician Endocrinology  12/22/16     Chief Complaint  Patient presents with  . URI    fever,cough,chest congestion,nausea x 4 days     Subjective: Pt presents for an OV with complaints of cough of fever, nausea, chest congestion of 4 days duration.  Associated symptoms include increased phlegm.  She is very fatigued he has not been able to drink much secondary to nausea.  She denies vomiting or diarrhea.  She did not have her flu shot this year.  Depression screen [facility] 2/9 01/12/2017 12/20/2016  Decreased Interest 0 0  Down, Depressed, Hopeless 0 0  PHQ - 2 Score 0 0    Allergies  Allergen Reactions  . Codeine Nausea And Vomiting   Social History   Tobacco Use  . Smoking status: Never Smoker  . Smokeless tobacco: Never Used  Substance Use Topics  . Alcohol use: No   Past Medical History:  Diagnosis Date  . Anemia   . Arthritis   . Basal cell carcinoma (BCC)    Basal cell Carcinoma  . Cataract   . Colon polyps   . Diverticulosis   . Hypertension   . Hypothyroid   . Migraines   . Osteopenia    Past Surgical History:  Procedure Laterality Date  . BASAL CELL CARCINOMA EXCISION  09/2015   Back  . birth mark removal  1998   on (R) side of neck  . CARPAL TUNNEL RELEASE  1998  . TONSILLECTOMY  1956   Family History  Problem Relation Age of Onset  . Diabetes Mother   . Thyroid disease Mother   . Heart disease Mother 32  . Hyperlipidemia Father   . Heart disease Father 75  . Hyperlipidemia Sister   . Cancer Maternal Aunt        Breast Cancer  . Thyroid disease Maternal Aunt   . Melanoma Maternal Aunt    Allergies as of 08/17/2017      Reactions   Codeine Nausea And Vomiting        Medication List        Accurate as of 08/17/17  5:25 PM. Always use your most recent med list.          amLODipine 10 MG tablet Commonly known as:  NORVASC Take 1 tablet (10 mg total) by mouth daily.   benzonatate 200 MG capsule Commonly known as:  TESSALON Take 1 capsule (200 mg total) by mouth 2 (two) times daily as needed for cough.   hydrochlorothiazide 25 MG tablet Commonly known as:  HYDRODIURIL Take 1 tablet (25 mg total) by mouth daily.   ondansetron 4 MG tablet Commonly known as:  ZOFRAN Take 1 tablet (4 mg total) by mouth every 8 (eight) hours as needed for nausea or vomiting.   oseltamivir 75 MG capsule Commonly known as:  TAMIFLU Take 1 capsule (75 mg total) by mouth 2 (two) times daily.   SYNTHROID 100 MCG tablet Generic drug:  levothyroxine TAKE 1 TABLET EVERY DAY BEFORE BREAKFAST   Vitamin D (Ergocalciferol) 50000 units Caps capsule Commonly known as:  DRISDOL TAKE 1 CAPSULE (50,000 UNITS TOTAL) BY MOUTH EVERY 7 (SEVEN) DAYS.  All past medical history, surgical history, allergies, family history, immunizations andmedications were updated in the EMR today and reviewed under the history and medication portions of their EMR.     ROS: Negative, with the exception of above mentioned in HPI   Objective:  BP 116/81 (BP Location: Left Arm, Patient Position: Sitting, Cuff Size: Normal)   Pulse 68   Temp 98.6 F (37 C)   Resp 20   Ht 5\' 3"  (1.6 m)   Wt 179 lb (81.2 kg)   SpO2 98%   BMI 31.71 kg/m  Body mass index is 31.71 kg/m. Gen: Afebrile. No acute distress.  Appears fatigued and ill today, well developed, well nourished.  HENT: AT. . Bilateral TM visualized with fullness, no erythema. MMM, no oral lesions. Bilateral nares with erythema, drainage and swelling. Throat without erythema or exudates.  Cough present.  Hoarseness present. Eyes:Pupils Equal Round Reactive to light, Extraocular movements intact,  Conjunctiva without redness, discharge  or icterus. Neck/lymp/endocrine: Supple, no lymphadenopathy CV: RRR no murmur, no edema Chest: CTAB, no wheeze or crackles. Good air movement, normal resp effort.  Abd: Soft. NTND. BS present.  No masses palpated.  Skin: no rashes, purpura or petechiae.  Neuro:  Normal gait. PERLA. EOMi. Alert. Oriented x3  Psych: Normal affect, dress and demeanor. Normal speech. Normal thought content and judgment.  No exam data present No results found. Results for orders placed or performed in visit on 08/17/17 (from the past 24 hour(s))  POC Influenza A&B (Binax test)     Status: Abnormal   Collection Time: 08/17/17  8:48 AM  Result Value Ref Range   Influenza A, POC Positive (A) Negative   Influenza B, POC Negative Negative    Assessment/Plan: Alexis Beard is a 72 y.o. female present for OV for  Flu-like symptoms/Influenza A Influenza A positive today.  Tamiflu prescribed.  Tessalon Perles prescribed.  Rest.  Hydration.  Mucinex DM. - POC Influenza A&B (Binax test) -IM Zofran provided today.  Patient strongly encouraged to go home and take her thyroid medication.  He can hold her blood pressure pills for the next 1-2 days until tolerating p.o. Nausea - ondansetron (ZOFRAN) injection 4 mg -Zofran pills prescribed to start in 8 hours, secondary to injection provided today, and continue taking every 8 hours for 48 hours to allow adequate hydration and nutrition.   Reviewed expectations re: course of current medical issues.  Discussed self-management of symptoms.  Outlined signs and symptoms indicating need for more acute intervention.  Patient verbalized understanding and all questions were answered.  Patient received an After-Visit Summary.    Orders Placed This Encounter  Procedures  . POC Influenza A&B (Binax test)    > 25 minutes spent with patient, >50% of time spent face to face counseling    Note is dictated utilizing voice recognition software. Although note has been  proof read prior to signing, occasional typographical errors still can be missed. If any questions arise, please do not hesitate to call for verification.   electronically signed by:  Howard Pouch, DO  [place]

## 2017-09-08 ENCOUNTER — Other Ambulatory Visit: Payer: Self-pay | Admitting: Endocrinology

## 2017-09-27 ENCOUNTER — Ambulatory Visit: Payer: BLUE CROSS/BLUE SHIELD | Admitting: Endocrinology

## 2017-09-29 ENCOUNTER — Other Ambulatory Visit: Payer: Self-pay | Admitting: Endocrinology

## 2017-12-30 ENCOUNTER — Telehealth: Payer: Self-pay | Admitting: Family Medicine

## 2017-12-30 ENCOUNTER — Other Ambulatory Visit: Payer: Self-pay | Admitting: Endocrinology

## 2017-12-30 NOTE — Telephone Encounter (Signed)
Spoke with patient scheduled an appt with Dr Raoul Pitch

## 2017-12-30 NOTE — Telephone Encounter (Signed)
Copied from [place] [id number]. Topic: Quick Communication - Rx Refill/Question >> Dec 30, 2017  2:45 PM Scherrie Gerlach wrote: Medication: SYNTHROID 100 MCG tablet 90 day Pt does not want to go back to Dr Dwyane Dee and would like Dr Raoul Pitch to fill this med for her. Pt wants name brand. [id number] [place], [place] [phone] (Phone) [phone] (Fax)

## 2018-01-02 ENCOUNTER — Ambulatory Visit: Payer: BLUE CROSS/BLUE SHIELD | Admitting: Family Medicine

## 2018-01-02 ENCOUNTER — Encounter: Payer: Self-pay | Admitting: Family Medicine

## 2018-01-02 VITALS — BP 118/75 | HR 67 | Resp 16 | Ht 63.0 in | Wt 177.0 lb

## 2018-01-02 DIAGNOSIS — I10 Essential (primary) hypertension: Secondary | ICD-10-CM | POA: Diagnosis not present

## 2018-01-02 DIAGNOSIS — E039 Hypothyroidism, unspecified: Secondary | ICD-10-CM | POA: Diagnosis not present

## 2018-01-02 DIAGNOSIS — E785 Hyperlipidemia, unspecified: Secondary | ICD-10-CM

## 2018-01-02 MED ORDER — AMLODIPINE BESYLATE 10 MG PO TABS: 10.0000 mg | ORAL_TABLET | Freq: Every day | ORAL | 1 refills | Status: DC

## 2018-01-02 MED ORDER — LEVOTHYROXINE SODIUM 100 MCG PO TABS: 100.0000 ug | ORAL_TABLET | Freq: Every day | ORAL | 0 refills | Status: DC

## 2018-01-02 MED ORDER — HYDROCHLOROTHIAZIDE 25 MG PO TABS: 25.0000 mg | ORAL_TABLET | Freq: Every day | ORAL | 1 refills | Status: DC

## 2018-01-02 NOTE — Progress Notes (Signed)
Patient ID: Alexis Beard, female  DOB: [birth year], 72 y.o.   MRN: [id number] Patient Care Team    Relationship Specialty Notifications Start End  Ma Hillock, DO PCP - General Family Medicine  12/20/16   Rolm Bookbinder, MD Consulting Physician Dermatology  12/22/16   Elayne Snare, MD Consulting Physician Endocrinology  12/22/16     Chief Complaint  Patient presents with  . Hypothyroidism    follow up    Subjective:  Alexis Beard is a 72 y.o.  female present for HTN follow up  Hypertension/morbid obesity: Pt reports compliance with amlodipine 10 mg daily and HCTZ 25 mg a day.  She had also been prescribed ramipril in the past, but she states she hasn't taken this in a few years because she was worried it was going to make her dizzy, she does not want to restart ramipril. Patient denies chest pain, shortness of breath, dizziness or lower extremity edema.  Pt does not take a daily baby ASA. Pt is not  prescribed statin. BMP: 06/30/2017 within normal limits CBC: 07/28/2017 within normal limits Lipid: 06/30/2017 total cholesterol 200 HDL 35, LDL 107, triglycerides 80 Diet: Low-sodium diet  Exercise: Not routinely RF: Hypertension, family history present, obesity   Acquired hypothyroidism Patient has been seen by endocrinology recent TSH 0.51, T4 1 0.11 on 06/30/2017. She reports compliance with brand-name Synthroid 100 g daily. Needs refilled today.   Depression screen [facility] 2/9 01/12/2017 12/20/2016  Decreased Interest 0 0  Down, Depressed, Hopeless 0 0  PHQ - 2 Score 0 0   No flowsheet data found.   Fall Risk  01/02/2018 12/20/2016  Falls in the past year? No No    Immunization History  Administered Date(s) Administered  . Pneumococcal Conjugate-13 08/25/2015  . Tdap 08/27/2012    No exam data present  Past Medical History:  Diagnosis Date  . Anemia   . Arthritis   . Basal cell carcinoma (BCC)    Basal cell Carcinoma  . Cataract   . Colon polyps   .  Diverticulosis   . Hypertension   . Hypothyroid   . Migraines   . Osteopenia    Allergies  Allergen Reactions  . Codeine Nausea And Vomiting   Past Surgical History:  Procedure Laterality Date  . BASAL CELL CARCINOMA EXCISION  09/2015   Back  . birth mark removal  1998   on (R) side of neck  . CARPAL TUNNEL RELEASE  1998  . TONSILLECTOMY  1956   Family History  Problem Relation Age of Onset  . Diabetes Mother   . Thyroid disease Mother   . Heart disease Mother 23  . Hyperlipidemia Father   . Heart disease Father 77  . Hyperlipidemia Sister   . Cancer Maternal Aunt        Breast Cancer  . Thyroid disease Maternal Aunt   . Melanoma Maternal Aunt    Social History   Socioeconomic History  . Marital status: Married    Spouse name: Alexis Beard  . Number of children: 3  . Years of education: 74  . Highest education level: Not on file  Occupational History  . Occupation: Electrical engineer  . Financial resource strain: Not on file  . Food insecurity:    Worry: Not on file    Inability: Not on file  . Transportation needs:    Medical: Not on file    Non-medical: Not on file  Tobacco Use  .  Smoking status: Never Smoker  . Smokeless tobacco: Never Used  Substance and Sexual Activity  . Alcohol use: No  . Drug use: No  . Sexual activity: Never    Partners: Male    Birth control/protection: Condom  Lifestyle  . Physical activity:    Days per week: Not on file    Minutes per session: Not on file  . Stress: Not on file  Relationships  . Social connections:    Talks on phone: Not on file    Gets together: Not on file    Attends religious service: Not on file    Active member of club or organization: Not on file    Attends meetings of clubs or organizations: Not on file    Relationship status: Not on file  . Intimate partner violence:    Fear of current or ex partner: Not on file    Emotionally abused: Not on file    Physically abused: Not on file    Forced  sexual activity: Not on file  Other Topics Concern  . Not on file  Social History Narrative   Married to Alexis Beard, has children.   High school graduate.   Drinks caffeine.   Wears her seatbelt, wears a bicycle helmet.   Wears glasses.   Smoke detector in the home, firearms locked in the home.   Feels safe in her relationships.   Right handed.   Allergies as of 01/02/2018      Reactions   Codeine Nausea And Vomiting      Medication List        Accurate as of 01/02/18  8:54 AM. Always use your most recent med list.          amLODipine 10 MG tablet Commonly known as:  NORVASC Take 1 tablet (10 mg total) by mouth daily.   hydrochlorothiazide 25 MG tablet Commonly known as:  HYDRODIURIL Take 1 tablet (25 mg total) by mouth daily.   levothyroxine 100 MCG tablet Commonly known as:  SYNTHROID, LEVOTHROID Take 1 tablet (100 mcg total) by mouth daily before breakfast.   Vitamin D (Ergocalciferol) 50000 units Caps capsule Commonly known as:  DRISDOL TAKE 1 CAPSULE (50,000 UNITS TOTAL) BY MOUTH EVERY 7 (SEVEN) DAYS.       All past medical history, surgical history, allergies, family history, immunizations andmedications were updated in the EMR today and reviewed under the history and medication portions of their EMR.    No results found for this or any previous visit (from the past 2160 hour(s)).  No results found.   ROS: 14 pt review of systems performed and negative (unless mentioned in an HPI)  Objective: BP 118/75 (BP Location: Right Arm, Patient Position: Sitting, Cuff Size: Normal)   Pulse 67   Resp 16   Ht 5\' 3"  (1.6 m)   Wt 177 lb (80.3 kg)   SpO2 96%   BMI 31.35 kg/m  Gen: Afebrile. No acute distress.  Nontoxic. Pleasant caucasian female.  HENT: AT. [place].MMM. Eyes:Pupils Equal Round Reactive to light, Extraocular movements intact,  Conjunctiva without redness, discharge or icterus. Neck/lymp/endocrine: Supple,no lymphadenopathy, no thyromegaly CV: RRR no  murmur, no edema, +2/4 P posterior tibialis pulses Chest: CTAB, no wheeze or crackles Abd: Soft. NTND. BS present. no Masses palpated.  Skin: no rashes, purpura or petechiae.  Neuro:  Normal gait. PERLA. EOMi. Alert. Oriented x3 Psych: Normal affect, dress and demeanor. Normal speech. Normal thought content and judgment.   Assessment/plan: SEDRA MORFIN is  a 72 y.o. female present for establish care.  Essential hypertension/HLD/morbid obesity Stable.  - Low-sodium, exercise.  - Follow-up 6 months  Acquired hypothyroidism Stable.  TSH and T4 collected 06/30/2017 within normal limits. Continue Synthroid 100 g daily (brand name only). Refills provided 07/20/2017--> never filled. Likely ordered wrong number. Called in again.  F/U 6 mos.    Return in about 6 months (around 07/05/2018) for HTN/HLD/thyroid.   Note is dictated utilizing voice recognition software. Although note has been proof read prior to signing, occasional typographical errors still can be missed. If any questions arise, please do not hesitate to call for verification.  Electronically signed by: Howard Pouch, DO [place]

## 2018-01-02 NOTE — Patient Instructions (Addendum)
It was a pleasure seeing you today.  I have refilled all you meds.  Follow up in 6 months--> at this visit you will be due for labs cor your chronic medical conditions.    Please help [place] help you:  We are honored you have chosen [facility] for your Primary Care home. Below you will find basic instructions that you may need to access in the future. Please help [place] help you by reading the instructions, which cover many of the frequent questions we experience.   Prescription refills and request:  -In order to allow more efficient response time, please call your pharmacy for all refills. They will forward the request electronically to [place]. This allows for the quickest possible response. Request left on a nurse line can take longer to refill, since these are checked as time allows between office patients and other phone calls.  - refill request can take up to 3-5 working days to complete.  - If request is sent electronically and request is appropiate, it is usually completed in 1-2 business days.  - all patients will need to be seen routinely for all chronic medical conditions requiring prescription medications (see follow-up below). If you are overdue for follow up on your condition, you will be asked to make an appointment and we will call in enough medication to cover you until your appointment (up to 30 days).  - all controlled substances will require a face to face visit to request/refill.  - if you desire your prescriptions to go through a new pharmacy, and have an active script at original pharmacy, you will need to call your pharmacy and have scripts transferred to new pharmacy. This is completed between the pharmacy locations and not by your provider.    Results: If any images or labs were ordered, it can take up to 1 week to get results depending on the test ordered and the lab/facility running and resulting the test. - Normal or stable results, which do not need further discussion, may  be released to your mychart immediately with attached note to you. A call may not be generated for normal results. Please make certain to sign up for mychart. If you have questions on how to activate your mychart you can call the front office.  - If your results need further discussion, our office will attempt to contact you via phone, and if unable to reach you after 2 attempts, we will release your abnormal result to your mychart with instructions.  - All results will be automatically released in mychart after 1 week.  - Your provider will provide you with explanation and instruction on all relevant material in your results. Please keep in mind, results and labs may appear confusing or abnormal to the untrained eye, but it does not mean they are actually abnormal for you personally. If you have any questions about your results that are not covered, or you desire more detailed explanation than what was provided, you should make an appointment with your provider to do so.   Our office handles many outgoing and incoming calls daily. If we have not contacted you within 1 week about your results, please check your mychart to see if there is a message first and if not, then contact our office.  In helping with this matter, you help decrease call volume, and therefore allow [place] to be able to respond to patients needs more efficiently.   Acute office visits (sick visit):  An acute visit is  intended for a new problem and are scheduled in shorter time slots to allow schedule openings for patients with new problems. This is the appropriate visit to discuss a new problem. Problems will not be addressed by phone call or Echart message. Appointment is needed if requesting treatment. In order to provide you with excellent quality medical care with proper time for you to explain your problem, have an exam and receive treatment with instructions, these appointments should be limited to one new problem per visit. If you  experience a new problem, in which you desire to be addressed, please make an acute office visit, we save openings on the schedule to accommodate you. Please do not save your new problem for any other type of visit, let us take care of it properly and quickly for you.   Follow up visits:  Depending on your condition(s) your provider will need to see you routinely in order to provide you with quality care and prescribe medication(s). Most chronic conditions (Example: hypertension, Diabetes, depression/anxiety... etc), require visits a couple times a year. Your provider will instruct you on proper follow up for your personal medical conditions and history. Please make certain to make follow up appointments for your condition as instructed. Failing to do so could result in lapse in your medication treatment/refills. If you request a refill, and are overdue to be seen on a condition, we will always provide you with a 30 day script (once) to allow you time to schedule.    Medicare wellness (well visit): - we have a wonderful Nurse Maudie Mercury), that will meet with you and provide you will yearly medicare wellness visits. These visits should occur yearly (can not be scheduled less than 1 calendar year apart) and cover preventive health, immunizations, advance directives and screenings you are entitled to yearly through your medicare benefits. Do not miss out on your entitled benefits, this is when medicare will pay for these benefits to be ordered for you.  These are strongly encouraged by your provider and is the appropriate type of visit to make certain you are up to date with all preventive health benefits. If you have not had your medicare wellness exam in the last 12 months, please make certain to schedule one by calling the office and schedule your medicare wellness with Maudie Mercury as soon as possible.   Yearly physical (well visit):  - Adults are recommended to be seen yearly for physicals. Check with your insurance  and date of your last physical, most insurances require one calendar year between physicals. Physicals include all preventive health topics, screenings, medical exam and labs that are appropriate for gender/age and history. You may have fasting labs needed at this visit. This is a well visit (not a sick visit), new problems should not be covered during this visit (see acute visit).  - Pediatric patients are seen more frequently when they are younger. Your provider will advise you on well child visit timing that is appropriate for your their age. - This is not a medicare wellness visit. Medicare wellness exams do not have an exam portion to the visit. Some medicare companies allow for a physical, some do not allow a yearly physical. If your medicare allows a yearly physical you can schedule the medicare wellness with our nurse Maudie Mercury and have your physical with your provider after, on the same day. Please check with insurance for your full benefits.   Late Policy/No Shows:  - all new patients should arrive 15-30 minutes earlier than  appointment to allow [place] time  to  obtain all personal demographics,  insurance information and for you to complete office paperwork. - All established patients should arrive 10-15 minutes earlier than appointment time to update all information and be checked in .  - In our best efforts to run on time, if you are late for your appointment you will be asked to either reschedule or if able, we will work you back into the schedule. There will be a wait time to work you back in the schedule,  depending on availability.  - If you are unable to make it to your appointment as scheduled, please call 24 hours ahead of time to allow [place] to fill the time slot with someone else who needs to be seen. If you do not cancel your appointment ahead of time, you may be charged a no show fee.

## 2018-01-20 ENCOUNTER — Ambulatory Visit: Payer: BLUE CROSS/BLUE SHIELD | Admitting: Family Medicine

## 2018-03-01 ENCOUNTER — Other Ambulatory Visit: Payer: Self-pay | Admitting: Family Medicine

## 2018-03-01 NOTE — Telephone Encounter (Signed)
Copied from [place] [phone]. Topic: Quick Communication - Rx Refill/Question >> Mar 01, 2018 11:10 AM Percell Belt A wrote: Medication: amLODipine (NORVASC) 10 MG tablet [[id number]]  Has the patient contacted their pharmacy? No  (Agent: If no, request that the patient contact the pharmacy for the refill.) (Agent: If yes, when and what did the pharmacy advise?)  Preferred Pharmacy (with phone number or street name): [id number] [place], [place] [phone] (Phone)   Agent: Please be advised that RX refills may take up to 3 business days. We ask that you follow-up with your pharmacy.

## 2018-04-11 ENCOUNTER — Ambulatory Visit (INDEPENDENT_AMBULATORY_CARE_PROVIDER_SITE_OTHER): Payer: Managed Care, Other (non HMO)

## 2018-04-11 DIAGNOSIS — Z23 Encounter for immunization: Secondary | ICD-10-CM

## 2018-04-12 ENCOUNTER — Other Ambulatory Visit: Payer: Self-pay

## 2018-04-12 ENCOUNTER — Ambulatory Visit: Payer: Self-pay | Admitting: *Deleted

## 2018-04-12 ENCOUNTER — Encounter: Payer: Self-pay | Admitting: Family Medicine

## 2018-04-12 ENCOUNTER — Ambulatory Visit: Payer: Managed Care, Other (non HMO) | Admitting: Family Medicine

## 2018-04-12 VITALS — BP 122/81 | HR 76 | Temp 98.1°F | Resp 16 | Ht 63.0 in | Wt 183.2 lb

## 2018-04-12 DIAGNOSIS — N6489 Other specified disorders of breast: Secondary | ICD-10-CM | POA: Diagnosis not present

## 2018-04-12 DIAGNOSIS — S2000XA Contusion of breast, unspecified breast, initial encounter: Secondary | ICD-10-CM

## 2018-04-12 DIAGNOSIS — R55 Syncope and collapse: Secondary | ICD-10-CM | POA: Diagnosis not present

## 2018-04-12 LAB — CBC WITH DIFFERENTIAL/PLATELET
BASOS ABS: 0 10*3/uL (ref 0.0–0.1)
Basophils Relative: 0.6 % (ref 0.0–3.0)
EOS ABS: 0.1 10*3/uL (ref 0.0–0.7)
Eosinophils Relative: 1.5 % (ref 0.0–5.0)
HEMATOCRIT: 39.2 % (ref 36.0–46.0)
HEMOGLOBIN: 13.1 g/dL (ref 12.0–15.0)
LYMPHS PCT: 33.4 % (ref 12.0–46.0)
Lymphs Abs: 2.3 10*3/uL (ref 0.7–4.0)
MCHC: 33.4 g/dL (ref 30.0–36.0)
MCV: 89 fl (ref 78.0–100.0)
MONOS PCT: 6.3 % (ref 3.0–12.0)
Monocytes Absolute: 0.4 10*3/uL (ref 0.1–1.0)
Neutro Abs: 4 10*3/uL (ref 1.4–7.7)
Neutrophils Relative %: 58.2 % (ref 43.0–77.0)
Platelets: 287 10*3/uL (ref 150.0–400.0)
RBC: 4.4 Mil/uL (ref 3.87–5.11)
RDW: 13.3 % (ref 11.5–15.5)
WBC: 6.8 10*3/uL (ref 4.0–10.5)

## 2018-04-12 LAB — BASIC METABOLIC PANEL
BUN: 14 mg/dL (ref 6–23)
CALCIUM: 9.6 mg/dL (ref 8.4–10.5)
CO2: 29 meq/L (ref 19–32)
Chloride: 98 mEq/L (ref 96–112)
Creatinine, Ser: 0.8 mg/dL (ref 0.40–1.20)
GFR: 74.9 mL/min (ref >60.00)
Glucose, Bld: 102 mg/dL — ABNORMAL HIGH (ref 70–99)
POTASSIUM: 3.4 meq/L — AB (ref 3.5–5.1)
Sodium: 136 mEq/L (ref 135–145)

## 2018-04-12 LAB — TSH: TSH: 0.51 u[IU]/mL (ref 0.35–4.50)

## 2018-04-12 NOTE — Progress Notes (Signed)
   Subjective:    Patient ID: Alexis Beard, female    DOB: November 03, 1945, 72 y.o.   MRN: [id number]  HPI Syncope- pt got her flu shot yesterday.  She woke at 12:45am 'boiling inside'.  Pt had applied a Salonpas patch and the smell was overwhelming. Jumped up quickly to go to the restroom.  When she sat on the toilet, she grabbed the trash can b/c she felt she was going to vomit and 'that's the last thing I remember'.  She broke the trash can and is now bruised on legs and L breast.  Pt has hx of vertigo and also has hx of multiple syncopal episodes- 'it's always when I just get up'.  No CP, SOB.  Pt reports feeling back to normal.  Pt admits to poor water intake   Review of Systems For ROS see HPI     Objective:   Physical Exam  Constitutional: She is oriented to person, place, and time. She appears well-developed and well-nourished. No distress.  HENT:  Head: Normocephalic and atraumatic.  Eyes: Pupils are equal, round, and reactive to light. Conjunctivae and EOM are normal.  Neck: Normal range of motion. Neck supple. No thyromegaly present.  Cardiovascular: Normal rate, regular rhythm, normal heart sounds and intact distal pulses.  No murmur heard. Pulmonary/Chest: Effort normal and breath sounds normal. No respiratory distress.  Abdominal: Soft. She exhibits no distension. There is no tenderness.  Musculoskeletal: She exhibits no edema.  Lymphadenopathy:    She has no cervical adenopathy.  Neurological: She is alert and oriented to person, place, and time.  Skin: Skin is warm and dry.  Bruising of L breast Bruising of anterior thighs bilaterally  Psychiatric: She has a normal mood and affect. Her behavior is normal.  Vitals reviewed.         Assessment & Plan:  Syncope- new to provider, recurrent for pt.  Only occurs when getting out of bed.  Admits to poor water intake.  Given the history, pt likely had a syncopal episode.  EKG WNL.  Check labs to r/o metabolic cause.   Reviewed supportive care to avoid future episodes.  Pt expressed understanding and is in agreement w/ plan.   Bruising- no evidence of hematoma.  Pt to treat supportively w/ heat or ice.

## 2018-04-12 NOTE — Patient Instructions (Signed)
Follow up as needed or as scheduled We'll notify you of your lab results and make any changes if needed INCREASE your water intake When you get up- 1st sit on the edge of the bed, then stand at the side of the bed, and then go.  This allows your blood pressure to normalize Use heat or ice on the bruises (whichever feels better) Call with any questions or concerns Hang in there!! Happy Holidays!

## 2018-04-12 NOTE — Telephone Encounter (Signed)
Pt called with complaints of falling 04/11/18; she said that she got her flu shot yesterday, but she also had a backache so she applied 1 solonpas patch on her lower back at 1900; around 0100 04/12/18 she says that she sat down on the commode, and when she passed out she landed on top of the trash can; she said that she is bruised on her left side; the pt said that she is not having any problems bruising; the pt says that she thinks the scent from the patches overwhelmed her; she says that she would also like to have he blood pressure checked; recommendations made per nurse triage protocol; the pt normally sees Dr Raoul Pitch, [place] but there is no availability in that office today (confirmed with Estill Bamberg); pt offered and accepted appointment with Dr Annye Asa, [facility], 04/12/18 at 1400; she verbalized understanding; will route to office for notification.   Reason for Disposition . [1] Age > 50 years  AND [2] now alert and feels fine  Answer Assessment - Initial Assessment Questions 1. ONSET: "How long were you unconscious?" (minutes) "When did it happen?"     Not sure; 04/12/18 at 0100 2. CONTENT: "What happened during period of unconsciousness?" (e.g., seizure activity)      Not sure 3. MENTAL STATUS: "Alert and oriented now?" (oriented x 3 = name, month, location)      yes 4. TRIGGER: "What do you think caused the fainting?" "What were you doing just before you fainted?"  (e.g., exercise, sudden standing up, prolonged standing)     Smell from pathces 5. RECURRENT SYMPTOM: "Have you ever passed out before?" If so, ask: "When was the last time?" and "What happened that time?"      Yes , dehydrated from flu 2018 6. INJURY: "Did you sustain any injury during the fall?"      Yes bruising to left side especially around breast 7. CARDIAC SYMPTOMS: "Have you had any of the following symptoms: chest pain, difficulty breathing, palpitations?"     no 8. NEUROLOGIC SYMPTOMS: "Have you had  any of the following symptoms: headache, numbness, vertigo, weakness?"     no 9. GI SYMPTOMS: "Have you had any of the following symptoms: abdominal pain, vomiting, diarrhea, blood in stools?"     nausea prior episode 10. OTHER SYMPTOMS: "Do you have any other symptoms?"       Hot, "boiling inside" 11. PREGNANCY: "Is there any chance you are pregnant?" "When was your last menstrual period?"       no  Protocols used: [facility]

## 2018-06-26 ENCOUNTER — Ambulatory Visit: Payer: Managed Care, Other (non HMO) | Admitting: Family Medicine

## 2018-06-26 ENCOUNTER — Encounter: Payer: Self-pay | Admitting: Family Medicine

## 2018-06-26 ENCOUNTER — Telehealth: Payer: Self-pay

## 2018-06-26 VITALS — BP 130/80 | HR 70 | Temp 97.7°F | Resp 16 | Ht 63.0 in | Wt 186.4 lb

## 2018-06-26 DIAGNOSIS — E559 Vitamin D deficiency, unspecified: Secondary | ICD-10-CM

## 2018-06-26 DIAGNOSIS — E669 Obesity, unspecified: Secondary | ICD-10-CM | POA: Diagnosis not present

## 2018-06-26 DIAGNOSIS — I10 Essential (primary) hypertension: Secondary | ICD-10-CM

## 2018-06-26 DIAGNOSIS — E785 Hyperlipidemia, unspecified: Secondary | ICD-10-CM | POA: Diagnosis not present

## 2018-06-26 DIAGNOSIS — Z1211 Encounter for screening for malignant neoplasm of colon: Secondary | ICD-10-CM

## 2018-06-26 DIAGNOSIS — E039 Hypothyroidism, unspecified: Secondary | ICD-10-CM | POA: Diagnosis not present

## 2018-06-26 LAB — LIPID PANEL
CHOL/HDL RATIO: 3
Cholesterol: 198 mg/dL (ref 0–200)
HDL: 65.2 mg/dL (ref >39.00)
LDL Cholesterol: 108 mg/dL — ABNORMAL HIGH (ref 0–99)
NONHDL: 132.47
Triglycerides: 123 mg/dL (ref 0.0–149.0)
VLDL: 24.6 mg/dL (ref 0.0–40.0)

## 2018-06-26 MED ORDER — VITAMIN D (ERGOCALCIFEROL) 1.25 MG (50000 UNIT) PO CAPS: 50000.0000 [IU] | ORAL_CAPSULE | ORAL | 1 refills | Status: DC

## 2018-06-26 MED ORDER — LEVOTHYROXINE SODIUM 100 MCG PO TABS
100.0000 ug | ORAL_TABLET | Freq: Every day | ORAL | 3 refills | Status: DC
Start: 2018-06-26 — End: 2018-12-28

## 2018-06-26 MED ORDER — AMLODIPINE BESYLATE 10 MG PO TABS: 10.0000 mg | ORAL_TABLET | Freq: Every day | ORAL | 1 refills | Status: DC

## 2018-06-26 MED ORDER — HYDROCHLOROTHIAZIDE 25 MG PO TABS: 25.0000 mg | ORAL_TABLET | Freq: Every day | ORAL | 1 refills | Status: DC

## 2018-06-26 NOTE — Addendum Note (Signed)
Addended by: Howard Pouch A on: 06/26/2018 04:18 PM   Modules accepted: Orders

## 2018-06-26 NOTE — Patient Instructions (Signed)
Next appt schedule a physical and we will get there rest of your labs and immunizations up to date.   I have refilled your medications.   I would recommend you get the normal flu shot in the future.    Please help [place] help you:  We are honored you have chosen [facility] for your Primary Care home. Below you will find basic instructions that you may need to access in the future. Please help [place] help you by reading the instructions, which cover many of the frequent questions we experience.   Prescription refills and request:  -In order to allow more efficient response time, please call your pharmacy for all refills. They will forward the request electronically to [place]. This allows for the quickest possible response. Request left on a nurse line can take longer to refill, since these are checked as time allows between office patients and other phone calls.  - refill request can take up to 3-5 working days to complete.  - If request is sent electronically and request is appropiate, it is usually completed in 1-2 business days.  - all patients will need to be seen routinely for all chronic medical conditions requiring prescription medications (see follow-up below). If you are overdue for follow up on your condition, you will be asked to make an appointment and we will call in enough medication to cover you until your appointment (up to 30 days).  - all controlled substances will require a face to face visit to request/refill.  - if you desire your prescriptions to go through a new pharmacy, and have an active script at original pharmacy, you will need to call your pharmacy and have scripts transferred to new pharmacy. This is completed between the pharmacy locations and not by your provider.    Results: If any images or labs were ordered, it can take up to 1 week to get results depending on the test ordered and the lab/facility running and resulting the test. - Normal or stable results, which do  not need further discussion, may be released to your mychart immediately with attached note to you. A call may not be generated for normal results. Please make certain to sign up for mychart. If you have questions on how to activate your mychart you can call the front office.  - If your results need further discussion, our office will attempt to contact you via phone, and if unable to reach you after 2 attempts, we will release your abnormal result to your mychart with instructions.  - All results will be automatically released in mychart after 1 week.  - Your provider will provide you with explanation and instruction on all relevant material in your results. Please keep in mind, results and labs may appear confusing or abnormal to the untrained eye, but it does not mean they are actually abnormal for you personally. If you have any questions about your results that are not covered, or you desire more detailed explanation than what was provided, you should make an appointment with your provider to do so.   Our office handles many outgoing and incoming calls daily. If we have not contacted you within 1 week about your results, please check your mychart to see if there is a message first and if not, then contact our office.  In helping with this matter, you help decrease call volume, and therefore allow [place] to be able to respond to patients needs more efficiently.   Acute office visits (sick  visit):  An acute visit is intended for a new problem and are scheduled in shorter time slots to allow schedule openings for patients with new problems. This is the appropriate visit to discuss a new problem. Problems will not be addressed by phone call or Echart message. Appointment is needed if requesting treatment. In order to provide you with excellent quality medical care with proper time for you to explain your problem, have an exam and receive treatment with instructions, these appointments should be limited to one  new problem per visit. If you experience a new problem, in which you desire to be addressed, please make an acute office visit, we save openings on the schedule to accommodate you. Please do not save your new problem for any other type of visit, let us take care of it properly and quickly for you.   Follow up visits:  Depending on your condition(s) your provider will need to see you routinely in order to provide you with quality care and prescribe medication(s). Most chronic conditions (Example: hypertension, Diabetes, depression/anxiety... etc), require visits a couple times a year. Your provider will instruct you on proper follow up for your personal medical conditions and history. Please make certain to make follow up appointments for your condition as instructed. Failing to do so could result in lapse in your medication treatment/refills. If you request a refill, and are overdue to be seen on a condition, we will always provide you with a 30 day script (once) to allow you time to schedule.    Medicare wellness (well visit): - we have a wonderful Nurse Maudie Mercury), that will meet with you and provide you will yearly medicare wellness visits. These visits should occur yearly (can not be scheduled less than 1 calendar year apart) and cover preventive health, immunizations, advance directives and screenings you are entitled to yearly through your medicare benefits. Do not miss out on your entitled benefits, this is when medicare will pay for these benefits to be ordered for you.  These are strongly encouraged by your provider and is the appropriate type of visit to make certain you are up to date with all preventive health benefits. If you have not had your medicare wellness exam in the last 12 months, please make certain to schedule one by calling the office and schedule your medicare wellness with Maudie Mercury as soon as possible.   Yearly physical (well visit):  - Adults are recommended to be seen yearly for  physicals. Check with your insurance and date of your last physical, most insurances require one calendar year between physicals. Physicals include all preventive health topics, screenings, medical exam and labs that are appropriate for gender/age and history. You may have fasting labs needed at this visit. This is a well visit (not a sick visit), new problems should not be covered during this visit (see acute visit).  - Pediatric patients are seen more frequently when they are younger. Your provider will advise you on well child visit timing that is appropriate for your their age. - This is not a medicare wellness visit. Medicare wellness exams do not have an exam portion to the visit. Some medicare companies allow for a physical, some do not allow a yearly physical. If your medicare allows a yearly physical you can schedule the medicare wellness with our nurse Maudie Mercury and have your physical with your provider after, on the same day. Please check with insurance for your full benefits.   Late Policy/No Shows:  - all new patients  should arrive 15-30 minutes earlier than appointment to allow [place] time  to  obtain all personal demographics,  insurance information and for you to complete office paperwork. - All established patients should arrive 10-15 minutes earlier than appointment time to update all information and be checked in .  - In our best efforts to run on time, if you are late for your appointment you will be asked to either reschedule or if able, we will work you back into the schedule. There will be a wait time to work you back in the schedule,  depending on availability.  - If you are unable to make it to your appointment as scheduled, please call 24 hours ahead of time to allow [place] to fill the time slot with someone else who needs to be seen. If you do not cancel your appointment ahead of time, you may be charged a no show fee.

## 2018-06-26 NOTE — Progress Notes (Signed)
Patient ID: Alexis Beard, female  DOB: [birth year], 73 y.o.   MRN: [id number] Patient Care Team    Relationship Specialty Notifications Start End  Alexis Hillock, DO PCP - General Family Medicine  12/20/16   Alexis Bookbinder, MD Consulting Physician Dermatology  12/22/16   Alexis Snare, MD Consulting Physician Endocrinology  12/22/16     Chief Complaint  Patient presents with  . Hypertension    Fasting.   Marland Kitchen Hypothyroidism  . Fall    Pt had fall back in November after flu shot. She would like to discuss that.     Subjective:  Alexis Beard is a 73 y.o.  female present for HTN follow up  Hypertension/morbid obesity: Pt reports compliance with amlodipine 10 mg daily and HCTZ 25 mg a day.  She had also been prescribed ramipril in the past, but she states she hasn't taken this in a few years because she was worried it was going to make her dizzy, she does not want to restart ramipril. Patient denies chest pain, shortness of breath, dizziness or lower extremity edema.  Pt does not take a daily baby ASA. Pt is not  prescribed statin. BMP: 04/12/2018 within normal limits CBC: 11/27//2019 within normal limits Lipid: 06/30/2017 total cholesterol 200 HDL 35, LDL 107, triglycerides 80 Diet: Low-sodium diet  Exercise: Not routinely RF: Hypertension, family history present, obesity   Acquired hypothyroidism tsh normal 03/2018.   Depression screen [facility] 2/9 04/12/2018 01/12/2017 12/20/2016  Decreased Interest 0 0 0  Down, Depressed, Hopeless 0 0 0  PHQ - 2 Score 0 0 0  Altered sleeping 0 - -  Tired, decreased energy 0 - -  Change in appetite 0 - -  Feeling bad or failure about yourself  0 - -  Trouble concentrating 0 - -  Moving slowly or fidgety/restless 0 - -  Suicidal thoughts 0 - -  PHQ-9 Score 0 - -  Difficult doing work/chores Not difficult at all - -   No flowsheet data found.   Fall Risk  06/26/2018 04/12/2018 01/02/2018 12/20/2016  Falls in the past year? 1 0 No No  Number falls  in past yr: 0 - - -  Injury with Fall? 1 - - -  Risk for fall due to : Impaired vision;Medication side effect - - -  Follow up Education provided - - -    Immunization History  Administered Date(s) Administered  . Influenza, High Dose Seasonal PF 04/11/2018  . Pneumococcal Conjugate-13 08/25/2015  . Tdap 08/27/2012    No exam data present  Past Medical History:  Diagnosis Date  . Anemia   . Arthritis   . Basal cell carcinoma (BCC)    Basal cell Carcinoma  . Cataract   . Colon polyps   . Diverticulosis   . Hypertension   . Hypothyroid   . Migraines   . Osteopenia    Allergies  Allergen Reactions  . Codeine Nausea And Vomiting   Past Surgical History:  Procedure Laterality Date  . BASAL CELL CARCINOMA EXCISION  09/2015   Back  . birth mark removal  1998   on (R) side of neck  . CARPAL TUNNEL RELEASE  1998  . TONSILLECTOMY  1956   Family History  Problem Relation Age of Onset  . Diabetes Mother   . Thyroid disease Mother   . Heart disease Mother 23  . Hyperlipidemia Father   . Heart disease Father 43  . Hyperlipidemia Sister   .  Cancer Maternal Aunt        Breast Cancer  . Thyroid disease Maternal Aunt   . Melanoma Maternal Aunt    Social History   Socioeconomic History  . Marital status: Married    Spouse name: Alexis Beard  . Number of children: 3  . Years of education: 35  . Highest education level: Not on file  Occupational History  . Occupation: Electrical engineer  . Financial resource strain: Not on file  . Food insecurity:    Worry: Not on file    Inability: Not on file  . Transportation needs:    Medical: Not on file    Non-medical: Not on file  Tobacco Use  . Smoking status: Never Smoker  . Smokeless tobacco: Never Used  Substance and Sexual Activity  . Alcohol use: No  . Drug use: No  . Sexual activity: Never    Partners: Male    Birth control/protection: Condom  Lifestyle  . Physical activity:    Days per week: Not on file     Minutes per session: Not on file  . Stress: Not on file  Relationships  . Social connections:    Talks on phone: Not on file    Gets together: Not on file    Attends religious service: Not on file    Active member of club or organization: Not on file    Attends meetings of clubs or organizations: Not on file    Relationship status: Not on file  . Intimate partner violence:    Fear of current or ex partner: Not on file    Emotionally abused: Not on file    Physically abused: Not on file    Forced sexual activity: Not on file  Other Topics Concern  . Not on file  Social History Narrative   Married to Alexis Beard, has children.   High school graduate.   Drinks caffeine.   Wears her seatbelt, wears a bicycle helmet.   Wears glasses.   Smoke detector in the home, firearms locked in the home.   Feels safe in her relationships.   Right handed.   Allergies as of 06/26/2018      Reactions   Codeine Nausea And Vomiting      Medication List       Accurate as of June 26, 2018  9:10 AM. Always use your most recent med list.        amLODipine 10 MG tablet Commonly known as:  NORVASC Take 1 tablet (10 mg total) by mouth daily.   hydrochlorothiazide 25 MG tablet Commonly known as:  HYDRODIURIL Take 1 tablet (25 mg total) by mouth daily.   levothyroxine 100 MCG tablet Commonly known as:  SYNTHROID Take 1 tablet (100 mcg total) by mouth daily before breakfast.   Vitamin D (Ergocalciferol) 1.25 MG (50000 UT) Caps capsule Commonly known as:  DRISDOL Take 1 capsule (50,000 Units total) by mouth every 14 (fourteen) days.       All past medical history, surgical history, allergies, family history, immunizations andmedications were updated in the EMR today and reviewed under the history and medication portions of their EMR.    Recent Results (from the past 2160 hour(s))  Basic metabolic panel     Status: Abnormal   Collection Time: 04/12/18  2:27 PM  Result Value Ref Range    Sodium 136 135 - 145 mEq/L   Potassium 3.4 (L) 3.5 - 5.1 mEq/L   Chloride 98 96 - 112  mEq/L   CO2 29 19 - 32 mEq/L   Glucose, Bld 102 (H) 70 - 99 mg/dL   BUN 14 6 - 23 mg/dL   Creatinine, Ser 0.80 0.40 - 1.20 mg/dL   Calcium 9.6 8.4 - 10.5 mg/dL   GFR 74.90 >60.00 mL/min  TSH     Status: None   Collection Time: 04/12/18  2:27 PM  Result Value Ref Range   TSH 0.51 0.35 - 4.50 uIU/mL  CBC with Differential/Platelet     Status: None   Collection Time: 04/12/18  2:27 PM  Result Value Ref Range   WBC 6.8 4.0 - 10.5 K/uL   RBC 4.40 3.87 - 5.11 Mil/uL   Hemoglobin 13.1 12.0 - 15.0 g/dL   HCT 39.2 36.0 - 46.0 %   MCV 89.0 78.0 - 100.0 fl   MCHC 33.4 30.0 - 36.0 g/dL   RDW 13.3 11.5 - 15.5 %   Platelets 287.0 150.0 - 400.0 K/uL   Neutrophils Relative % 58.2 43.0 - 77.0 %   Lymphocytes Relative 33.4 12.0 - 46.0 %   Monocytes Relative 6.3 3.0 - 12.0 %   Eosinophils Relative 1.5 0.0 - 5.0 %   Basophils Relative 0.6 0.0 - 3.0 %   Neutro Abs 4.0 1.4 - 7.7 K/uL   Lymphs Abs 2.3 0.7 - 4.0 K/uL   Monocytes Absolute 0.4 0.1 - 1.0 K/uL   Eosinophils Absolute 0.1 0.0 - 0.7 K/uL   Basophils Absolute 0.0 0.0 - 0.1 K/uL    No results found.   ROS: 14 pt review of systems performed and negative (unless mentioned in an HPI)  Objective: BP 130/80 (BP Location: Left Arm, Patient Position: Sitting, Cuff Size: Normal)   Pulse 70   Temp 97.7 F (36.5 C) (Oral)   Resp 16   Ht 5\' 3"  (1.6 m)   Wt 186 lb 6 oz (84.5 kg)   SpO2 97%   BMI 33.01 kg/m  Gen: Afebrile. No acute distress. Obese.  HENT: AT. Donna.MMM.  Eyes:Pupils Equal Round Reactive to light, Extraocular movements intact,  Conjunctiva without redness, discharge or icterus. Neck/lymp/endocrine: Supple, no lymphadenopathy, no thyromegaly CV: RRR no murmur, no edema, +2/4 P posterior tibialis pulses Chest: CTAB, no wheeze or crackles Abd: Soft. NTND. BS present. no Masses palpated.  Neuro:  Normal gait. PERLA. EOMi. Alert. Oriented  x3 Psych: Normal affect, dress and demeanor. Normal speech. Normal thought content and judgment..   Assessment/plan: Alexis Beard is a 73 y.o. female present for establish care.  Essential hypertension/HLD/morbid obesity Stable.  - Low-sodium, exercise.  -Continue Norvasc 10 mg daily.  Refills provided today. -Continue HCTZ 25 mg daily.  Refills provided today. -Lipids collected today. - Follow-up 6 months  Acquired hypothyroidism stable TSH NL 03/2018.  Synthroid 100 mcg daily refilled today.  Reviewed notes surrounding her syncopal episode the evening of her high-dose flu shot.  Advised her in the future if she has concerns to discuss the regular flu shot since she has done well without the past and avoid the high-dose flu.  Return in about 6 months (around 12/25/2018) for CPE.  > 25 minutes spent with patient, >50% of time spent face to face counseling    Note is dictated utilizing voice recognition software. Although note has been proof read prior to signing, occasional typographical errors still can be missed. If any questions arise, please do not hesitate to call for verification.  Electronically signed by: Howard Pouch, DO [place]

## 2018-06-26 NOTE — Telephone Encounter (Signed)
Copied from [place] [id number]. Topic: Referral - Question >> Jun 26, 2018 12:26 PM Rayann Heman wrote: Reason for CRM: pt called and stated that she has seen [place] [facility] MD for GI and would like referral sent to them. Please advise

## 2018-06-26 NOTE — Telephone Encounter (Signed)
Referral placed.

## 2018-09-05 ENCOUNTER — Telehealth: Payer: Self-pay | Admitting: Family Medicine

## 2018-09-05 ENCOUNTER — Other Ambulatory Visit: Payer: Self-pay

## 2018-09-05 DIAGNOSIS — E785 Hyperlipidemia, unspecified: Secondary | ICD-10-CM

## 2018-09-05 MED ORDER — AMLODIPINE BESYLATE 10 MG PO TABS: 10.0000 mg | ORAL_TABLET | Freq: Every day | ORAL | 1 refills | Status: DC

## 2018-09-05 NOTE — Telephone Encounter (Signed)
Copied from [place] [id number]. Topic: Quick Communication - Rx Refill/Question >> Sep 05, 2018 12:06 PM Leward Quan A wrote: Medication: amLODipine (NORVASC) 10 MG tablet  Has the patient contacted their pharmacy? Yes.   (Agent: If no, request that the patient contact the pharmacy for the refill.) (Agent: If yes, when and what did the pharmacy advise?)  Preferred Pharmacy (with phone number or street name): [id number] - [place], [place] [phone] (Phone) [phone] (Fax)    Agent: Please be advised that RX refills may take up to 3 business days. We ask that you follow-up with your pharmacy.

## 2018-09-22 ENCOUNTER — Other Ambulatory Visit: Payer: Self-pay | Admitting: Endocrinology

## 2018-09-22 DIAGNOSIS — E559 Vitamin D deficiency, unspecified: Secondary | ICD-10-CM

## 2018-09-22 NOTE — Telephone Encounter (Signed)
Forward to [place] Team

## 2018-09-22 NOTE — Telephone Encounter (Signed)
Pt called in and is requesting refill on her vit D , not able to get reach office   [facility]acy [facility] [place]

## 2018-09-25 NOTE — Telephone Encounter (Signed)
RF request for Vit D 50,000  LOV: 06/26/2018 Next ov: 12/26/2018 Last written: 06/26/2018   This was reduced down to twice monthly on 06/30/2017. Please advise if refill is appropriate.

## 2018-09-25 NOTE — Addendum Note (Signed)
Addended by: Caroll Rancher L on: 09/25/2018 02:21 PM   Modules accepted: Orders

## 2018-09-27 NOTE — Telephone Encounter (Signed)
Pt was called and told this RX was denied by Dr Raoul Pitch.

## 2018-09-27 NOTE — Telephone Encounter (Signed)
Patient inquiring about Vit D refill.   Please advise.  [facility] [place]

## 2018-11-09 ENCOUNTER — Ambulatory Visit (INDEPENDENT_AMBULATORY_CARE_PROVIDER_SITE_OTHER): Payer: Managed Care, Other (non HMO) | Admitting: Family Medicine

## 2018-11-09 ENCOUNTER — Other Ambulatory Visit: Payer: Self-pay

## 2018-11-09 ENCOUNTER — Encounter: Payer: Self-pay | Admitting: Family Medicine

## 2018-11-09 VITALS — BP 123/75 | HR 70 | Temp 97.6°F | Resp 17 | Ht 63.0 in | Wt 186.1 lb

## 2018-11-09 DIAGNOSIS — H6122 Impacted cerumen, left ear: Secondary | ICD-10-CM | POA: Diagnosis not present

## 2018-11-09 DIAGNOSIS — H9202 Otalgia, left ear: Secondary | ICD-10-CM

## 2018-11-09 MED ORDER — DEBROX 6.5 % OT SOLN: 5.0000 [drp] | Freq: Two times a day (BID) | OTIC | 11 refills | Status: DC | PRN

## 2018-11-09 NOTE — Progress Notes (Signed)
Alexis Beard , 1945-09-06, 73 y.o., female MRN: [id number] Patient Care Team    Relationship Specialty Notifications Start End  Ma Hillock, DO PCP - General Family Medicine  12/20/16   Rolm Bookbinder, MD Consulting Physician Dermatology  12/22/16   Elayne Snare, MD Consulting Physician Endocrinology  12/22/16     Chief Complaint  Patient presents with  . Ear Pain    L ear pain x2 months. Pt is taking Advil and using Peroxide. It is pounding, denies drainage, no fever.     Subjective: Pt presents for an OV with complaints of left ear pain of 2 months duration.  Associated symptoms include pain, intermittent ringing, occasionally feeling off balanced and over the last week she has had throbbing pain. She had been taking advil for pain. Yesterday she poured some peroxide and warm water in her ear and it has relieved the pressure and pain. She deneies fever, chills, drainage or decreased hearing.   Depression screen [facility] 2/9 04/12/2018 01/12/2017 12/20/2016  Decreased Interest 0 0 0  Down, Depressed, Hopeless 0 0 0  PHQ - 2 Score 0 0 0  Altered sleeping 0 - -  Tired, decreased energy 0 - -  Change in appetite 0 - -  Feeling bad or failure about yourself  0 - -  Trouble concentrating 0 - -  Moving slowly or fidgety/restless 0 - -  Suicidal thoughts 0 - -  PHQ-9 Score 0 - -  Difficult doing work/chores Not difficult at all - -    Allergies  Allergen Reactions  . Codeine Nausea And Vomiting   Social History   Social History Narrative   Married to Alexis Beard, has children.   High school graduate.   Drinks caffeine.   Wears her seatbelt, wears a bicycle helmet.   Wears glasses.   Smoke detector in the home, firearms locked in the home.   Feels safe in her relationships.   Right handed.   Past Medical History:  Diagnosis Date  . Anemia   . Arthritis   . Basal cell carcinoma (BCC)    Basal cell Carcinoma  . Cataract   . Colon polyps   . Diverticulosis   . Hypertension   .  Hypothyroid   . Migraines   . Osteopenia    Past Surgical History:  Procedure Laterality Date  . BASAL CELL CARCINOMA EXCISION  09/2015   Back  . birth mark removal  1998   on (R) side of neck  . CARPAL TUNNEL RELEASE  1998  . TONSILLECTOMY  1956   Family History  Problem Relation Age of Onset  . Diabetes Mother   . Thyroid disease Mother   . Heart disease Mother 7  . Hyperlipidemia Father   . Heart disease Father 15  . Hyperlipidemia Sister   . Cancer Maternal Aunt        Breast Cancer  . Thyroid disease Maternal Aunt   . Melanoma Maternal Aunt    Allergies as of 11/09/2018      Reactions   Codeine Nausea And Vomiting      Medication List       Accurate as of November 09, 2018 10:15 AM. If you have any questions, ask your nurse or doctor.        amLODipine 10 MG tablet Commonly known as: NORVASC Take 1 tablet (10 mg total) by mouth daily.   hydrochlorothiazide 25 MG tablet Commonly known as: HYDRODIURIL Take 1 tablet (25 mg total)  by mouth daily.   levothyroxine 100 MCG tablet Commonly known as: Synthroid Take 1 tablet (100 mcg total) by mouth daily before breakfast.   Vitamin D (Ergocalciferol) 1.25 MG (50000 UT) Caps capsule Commonly known as: DRISDOL Take 1 capsule (50,000 Units total) by mouth every 14 (fourteen) days.       All past medical history, surgical history, allergies, family history, immunizations andmedications were updated in the EMR today and reviewed under the history and medication portions of their EMR.     ROS: Negative, with the exception of above mentioned in HPI   Objective:  BP 123/75 (BP Location: Left Arm, Patient Position: Sitting, Cuff Size: Normal)   Pulse 70   Temp 97.6 F (36.4 C) (Temporal)   Resp 17   Ht 5\' 3"  (1.6 m)   Wt 186 lb 2 oz (84.4 kg)   SpO2 98%   BMI 32.97 kg/m  Body mass index is 32.97 kg/m. Gen: Afebrile. No acute distress. Nontoxic in appearance, well developed, well nourished.  HENT: AT. [place].MMM,  no oral lesions. Bilateral EAC clear right, left with small amount of cerumen now in the distal canal.  Eyes:Pupils Equal Round Reactive to light, Extraocular movements intact,  Conjunctiva without redness, discharge or icterus. Neuro:  Normal gait. PERLA. EOMi. Alert. Oriented x3  No exam data present No results found. No results found for this or any previous visit (from the past 24 hour(s)).  Assessment/Plan: [name] is a 73 y.o. female present for OV for  Left ear pain/Cerumen debris on tympanic membrane, left - Small amount of cerumen distal end of left canal. She has had much improvement since using the peroxide and warm water at home. Today she has had no pain.  - Debrox solution prescribed to clear remaining cerumen in canal and then can use a few times a month to prevent build up.  - f/u PRN    Reviewed expectations re: course of current medical issues.  Discussed self-management of symptoms.  Outlined signs and symptoms indicating need for more acute intervention.  Patient verbalized understanding and all questions were answered.  Patient received an After-Visit Summary.    No orders of the defined types were placed in this encounter.    Note is dictated utilizing voice recognition software. Although note has been proof read prior to signing, occasional typographical errors still can be missed. If any questions arise, please do not hesitate to call for verification.   electronically signed by:  Howard Pouch, DO  [place]

## 2018-11-09 NOTE — Patient Instructions (Signed)
Use the debrox solution twice a day until wax is clear. You have a small amount at the end of the canal- away from the ear drum.   You also can use the solution once or twice a month to keep wax to a minimum.     Earwax Buildup, Adult The ears produce a substance called earwax that helps keep bacteria out of the ear and protects the skin in the ear canal. Occasionally, earwax can build up in the ear and cause discomfort or hearing loss. What increases the risk? This condition is more likely to develop in people who:  Are female.  Are elderly.  Naturally produce more earwax.  Clean their ears often with cotton swabs.  Use earplugs often.  Use in-ear headphones often.  Wear hearing aids.  Have narrow ear canals.  Have earwax that is overly thick or sticky.  Have eczema.  Are dehydrated.  Have excess hair in the ear canal. What are the signs or symptoms? Symptoms of this condition include:  Reduced or muffled hearing.  A feeling of fullness in the ear or feeling that the ear is plugged.  Fluid coming from the ear.  Ear pain.  Ear itch.  Ringing in the ear.  Coughing.  An obvious piece of earwax that can be seen inside the ear canal. How is this diagnosed? This condition may be diagnosed based on:  Your symptoms.  Your medical history.  An ear exam. During the exam, your health care provider will look into your ear with an instrument called an otoscope. You may have tests, including a hearing test. How is this treated? This condition may be treated by:  Using ear drops to soften the earwax.  Having the earwax removed by a health care provider. The health care provider may: ? Flush the ear with water. ? Use an instrument that has a loop on the end (curette). ? Use a suction device.  Surgery to remove the wax buildup. This may be done in severe cases. Follow these instructions at home:   Take over-the-counter and prescription medicines only as told  by your health care provider.  Do not put any objects, including cotton swabs, into your ear. You can clean the opening of your ear canal with a washcloth or facial tissue.  Follow instructions from your health care provider about cleaning your ears. Do not over-clean your ears.  Drink enough fluid to keep your urine clear or pale yellow. This will help to thin the earwax.  Keep all follow-up visits as told by your health care provider. If earwax builds up in your ears often or if you use hearing aids, consider seeing your health care provider for routine, preventive ear cleanings. Ask your health care provider how often you should schedule your cleanings.  If you have hearing aids, clean them according to instructions from the manufacturer and your health care provider. Contact a health care provider if:  You have ear pain.  You develop a fever.  You have blood, pus, or other fluid coming from your ear.  You have hearing loss.  You have ringing in your ears that does not go away.  Your symptoms do not improve with treatment.  You feel like the room is spinning (vertigo). Summary  Earwax can build up in the ear and cause discomfort or hearing loss.  The most common symptoms of this condition include reduced or muffled hearing and a feeling of fullness in the ear or feeling that the  ear is plugged.  This condition may be diagnosed based on your symptoms, your medical history, and an ear exam.  This condition may be treated by using ear drops to soften the earwax or by having the earwax removed by a health care provider.  Do not put any objects, including cotton swabs, into your ear. You can clean the opening of your ear canal with a washcloth or facial tissue. This information is not intended to replace advice given to you by your health care provider. Make sure you discuss any questions you have with your health care provider. Document Released: 06/10/2004 Document Revised:  04/14/2017 Document Reviewed: 07/14/2016 Elsevier Interactive Patient Education  2019 [employer].

## 2018-12-26 ENCOUNTER — Other Ambulatory Visit: Payer: Self-pay

## 2018-12-26 ENCOUNTER — Encounter: Payer: Self-pay | Admitting: Family Medicine

## 2018-12-26 ENCOUNTER — Ambulatory Visit (INDEPENDENT_AMBULATORY_CARE_PROVIDER_SITE_OTHER): Payer: Managed Care, Other (non HMO) | Admitting: Family Medicine

## 2018-12-26 VITALS — BP 125/85 | HR 68 | Temp 97.5°F | Resp 17 | Ht 64.0 in | Wt 186.1 lb

## 2018-12-26 DIAGNOSIS — Z1231 Encounter for screening mammogram for malignant neoplasm of breast: Secondary | ICD-10-CM

## 2018-12-26 DIAGNOSIS — Z0000 Encounter for general adult medical examination without abnormal findings: Secondary | ICD-10-CM | POA: Diagnosis not present

## 2018-12-26 DIAGNOSIS — I10 Essential (primary) hypertension: Secondary | ICD-10-CM

## 2018-12-26 DIAGNOSIS — E785 Hyperlipidemia, unspecified: Secondary | ICD-10-CM | POA: Diagnosis not present

## 2018-12-26 DIAGNOSIS — R7309 Other abnormal glucose: Secondary | ICD-10-CM | POA: Diagnosis not present

## 2018-12-26 DIAGNOSIS — E039 Hypothyroidism, unspecified: Secondary | ICD-10-CM | POA: Diagnosis not present

## 2018-12-26 DIAGNOSIS — M810 Age-related osteoporosis without current pathological fracture: Secondary | ICD-10-CM

## 2018-12-26 DIAGNOSIS — Z1159 Encounter for screening for other viral diseases: Secondary | ICD-10-CM

## 2018-12-26 DIAGNOSIS — E669 Obesity, unspecified: Secondary | ICD-10-CM

## 2018-12-26 LAB — CBC
HCT: 43 % (ref 36.0–46.0)
Hemoglobin: 14.1 g/dL (ref 12.0–15.0)
MCHC: 32.9 g/dL (ref 30.0–36.0)
MCV: 90 fl (ref 78.0–100.0)
Platelets: 307 10*3/uL (ref 150.0–400.0)
RBC: 4.78 Mil/uL (ref 3.87–5.11)
RDW: 13.8 % (ref 11.5–15.5)
WBC: 6.3 10*3/uL (ref 4.0–10.5)

## 2018-12-26 LAB — TSH: TSH: 0.83 u[IU]/mL (ref 0.35–4.50)

## 2018-12-26 LAB — COMPREHENSIVE METABOLIC PANEL
ALT: 14 U/L (ref 0–35)
AST: 15 U/L (ref 0–37)
Albumin: 4.7 g/dL (ref 3.5–5.2)
Alkaline Phosphatase: 87 U/L (ref 39–117)
BUN: 11 mg/dL (ref 6–23)
CO2: 28 mEq/L (ref 19–32)
Calcium: 9.8 mg/dL (ref 8.4–10.5)
Chloride: 101 mEq/L (ref 96–112)
Creatinine, Ser: 0.72 mg/dL (ref 0.40–1.20)
GFR: 79.42 mL/min (ref >60.00)
Glucose, Bld: 90 mg/dL (ref 70–99)
Potassium: 4.6 mEq/L (ref 3.5–5.1)
Sodium: 137 mEq/L (ref 135–145)
Total Bilirubin: 0.6 mg/dL (ref 0.2–1.2)
Total Protein: 7.6 g/dL (ref 6.0–8.3)

## 2018-12-26 LAB — T4, FREE: Free T4: 1.1 ng/dL (ref 0.60–1.60)

## 2018-12-26 LAB — HEMOGLOBIN A1C: Hgb A1c MFr Bld: 5.8 % (ref 4.6–6.5)

## 2018-12-26 MED ORDER — HYDROCHLOROTHIAZIDE 25 MG PO TABS: 25.0000 mg | ORAL_TABLET | Freq: Every day | ORAL | 1 refills | Status: DC

## 2018-12-26 MED ORDER — AMLODIPINE BESYLATE 10 MG PO TABS: 10.0000 mg | ORAL_TABLET | Freq: Every day | ORAL | 1 refills | Status: DC

## 2018-12-26 NOTE — Patient Instructions (Signed)
We will call you with lab results.  I refilled your meds   Health Maintenance After Age 73 After age 36, you are at a higher risk for certain long-term diseases and infections as well as injuries from falls. Falls are a major cause of broken bones and head injuries in people who are older than age 80. Getting regular preventive care can help to keep you healthy and well. Preventive care includes getting regular testing and making lifestyle changes as recommended by your health care provider. Talk with your health care provider about:  Which screenings and tests you should have. A screening is a test that checks for a disease when you have no symptoms.  A diet and exercise plan that is right for you. What should I know about screenings and tests to prevent falls? Screening and testing are the best ways to find a health problem early. Early diagnosis and treatment give you the best chance of managing medical conditions that are common after age 19. Certain conditions and lifestyle choices may make you more likely to have a fall. Your health care provider may recommend:  Regular vision checks. Poor vision and conditions such as cataracts can make you more likely to have a fall. If you wear glasses, make sure to get your prescription updated if your vision changes.  Medicine review. Work with your health care provider to regularly review all of the medicines you are taking, including over-the-counter medicines. Ask your health care provider about any side effects that may make you more likely to have a fall. Tell your health care provider if any medicines that you take make you feel dizzy or sleepy.  Osteoporosis screening. Osteoporosis is a condition that causes the bones to get weaker. This can make the bones weak and cause them to break more easily.  Blood pressure screening. Blood pressure changes and medicines to control blood pressure can make you feel dizzy.  Strength and balance checks. Your  health care provider may recommend certain tests to check your strength and balance while standing, walking, or changing positions.  Foot health exam. Foot pain and numbness, as well as not wearing proper footwear, can make you more likely to have a fall.  Depression screening. You may be more likely to have a fall if you have a fear of falling, feel emotionally low, or feel unable to do activities that you used to do.  Alcohol use screening. Using too much alcohol can affect your balance and may make you more likely to have a fall. What actions can I take to lower my risk of falls? General instructions  Talk with your health care provider about your risks for falling. Tell your health care provider if: ? You fall. Be sure to tell your health care provider about all falls, even ones that seem minor. ? You feel dizzy, sleepy, or off-balance.  Take over-the-counter and prescription medicines only as told by your health care provider. These include any supplements.  Eat a healthy diet and maintain a healthy weight. A healthy diet includes low-fat dairy products, low-fat (lean) meats, and fiber from whole grains, beans, and lots of fruits and vegetables. Home safety  Remove any tripping hazards, such as rugs, cords, and clutter.  Install safety equipment such as grab bars in bathrooms and safety rails on stairs.  Keep rooms and walkways well-lit. Activity   Follow a regular exercise program to stay fit. This will help you maintain your balance. Ask your health care provider what  types of exercise are appropriate for you.  If you need a cane or walker, use it as recommended by your health care provider.  Wear supportive shoes that have nonskid soles. Lifestyle  Do not drink alcohol if your health care provider tells you not to drink.  If you drink alcohol, limit how much you have: ? 0-1 drink a day for women. ? 0-2 drinks a day for men.  Be aware of how much alcohol is in your  drink. In the [place] one drink equals one typical bottle of beer (12 oz), one-half glass of wine (5 oz), or one shot of hard liquor (1 oz).  Do not use any products that contain nicotine or tobacco, such as cigarettes and e-cigarettes. If you need help quitting, ask your health care provider. Summary  Having a healthy lifestyle and getting preventive care can help to protect your health and wellness after age 70.  Screening and testing are the best way to find a health problem early and help you avoid having a fall. Early diagnosis and treatment give you the best chance for managing medical conditions that are more common for people who are older than age 70.  Falls are a major cause of broken bones and head injuries in people who are older than age 64. Take precautions to prevent a fall at home.  Work with your health care provider to learn what changes you can make to improve your health and wellness and to prevent falls. This information is not intended to replace advice given to you by your health care provider. Make sure you discuss any questions you have with your health care provider. Document Released: 03/16/2017 Document Revised: 08/24/2018 Document Reviewed: 03/16/2017 Elsevier Patient Education  2020 [employer].

## 2018-12-26 NOTE — Progress Notes (Signed)
Patient ID: Alexis Beard, female  DOB: 05/10/1946, 73 y.o.   MRN: [id number] Patient Care Team    Relationship Specialty Notifications Start End  Ma Hillock, DO PCP - General Family Medicine  12/20/16   Rolm Bookbinder, MD Consulting Physician Dermatology  12/22/16   Elayne Snare, MD Consulting Physician Endocrinology  12/22/16     Chief Complaint  Patient presents with  . Annual Exam    Pt doing well with no complaints. Fasting. Dexa and mammogram 07/2016. Does not go to GYN. Needs to schedule colonoscopy    Subjective:  Alexis Beard is a 73 y.o.  Female  present for CPE. All past medical history, surgical history, allergies, family history, immunizations, medications and social history were updated in the electronic medical record today. All recent labs, ED visits and hospitalizations within the last year were reviewed.  Hypertension/morbid obesity: Pt reports compliance with amlodipine 10 mg daily and HCTZ 25 mg a day.  She had also been prescribed ramipril in the past, but she states she hasn't taken this in a few years because she was worried it was going to make her dizzy, she does not want to restart ramipril. Patient denies chest pain, shortness of breath, dizziness or lower extremity edema.  Pt does not take a daily baby ASA. Pt is not  prescribed statin. BMP: 04/12/2018 within normal limits CBC: 11/27//2019 within normal limits Lipid: 06/30/2017 total cholesterol 200 HDL 35, LDL 107, triglycerides 80 Diet: Low-sodium diet  Exercise: Not routinely RF: Hypertension, family history present, obesity  Acquired hypothyroidism tsh normal 03/2018.  Health maintenance:  Colonoscopy: completed 2011. She is checking with her insurance to find out exact date so she can have this scheduled.  Mammogram: completed:07/22/2016, ordered today..  Immunizations: tdap up-to-date 2014, Influenza up-to-date 2019, only low-dose shall be administered (encouraged yearly), PNA series Prevnar  13 completed 2017 would like to wait on Pneumovax 23-declined today, Shingrix declined Infectious disease screening: Hep C screening completed today DEXA: last completed 07/22/2016 T score -3.4, osteoporosis.>>ordered Assistive device: None Oxygen use: None Patient has a Dental home. Hospitalizations/ED visits: Reviewed   Depression screen [facility] 2/9 12/26/2018 04/12/2018 01/12/2017 12/20/2016  Decreased Interest 0 0 0 0  Down, Depressed, Hopeless 0 0 0 0  PHQ - 2 Score 0 0 0 0  Altered sleeping - 0 - -  Tired, decreased energy - 0 - -  Change in appetite - 0 - -  Feeling bad or failure about yourself  - 0 - -  Trouble concentrating - 0 - -  Moving slowly or fidgety/restless - 0 - -  Suicidal thoughts - 0 - -  PHQ-9 Score - 0 - -  Difficult doing work/chores - Not difficult at all - -   No flowsheet data found.   Immunization History  Administered Date(s) Administered  . Influenza, High Dose Seasonal PF 04/11/2018  . Pneumococcal Conjugate-13 08/25/2015  . Tdap 08/27/2012     Past Medical History:  Diagnosis Date  . Anemia   . Arthritis   . Basal cell carcinoma (BCC)    Basal cell Carcinoma  . Cataract   . Colon polyps   . Diverticulosis   . Hypertension   . Hypothyroid   . Migraines   . Osteopenia    Allergies  Allergen Reactions  . Codeine Nausea And Vomiting   Past Surgical History:  Procedure Laterality Date  . BASAL CELL CARCINOMA EXCISION  09/2015   Back  . birth  mark removal  1998   on (R) side of neck  . CARPAL TUNNEL RELEASE  1998  . TONSILLECTOMY  1956   Family History  Problem Relation Age of Onset  . Diabetes Mother   . Thyroid disease Mother   . Heart disease Mother 64  . Hyperlipidemia Father   . Heart disease Father 5  . Hyperlipidemia Sister   . Cancer Maternal Aunt        Breast Cancer  . Thyroid disease Maternal Aunt   . Melanoma Maternal Aunt    Social History   Social History Narrative   Married to [place], has children.   High  school graduate.   Drinks caffeine.   Wears her seatbelt, wears a bicycle helmet.   Wears glasses.   Smoke detector in the home, firearms locked in the home.   Feels safe in her relationships.   Right handed.    Allergies as of 12/26/2018      Reactions   Codeine Nausea And Vomiting      Medication List       Accurate as of December 26, 2018  1:08 PM. If you have any questions, ask your nurse or doctor.        STOP taking these medications   Debrox 6.5 % OTIC solution Generic drug: carbamide peroxide Stopped by: Howard Pouch, DO     TAKE these medications   amLODipine 10 MG tablet Commonly known as: NORVASC Take 1 tablet (10 mg total) by mouth daily.   hydrochlorothiazide 25 MG tablet Commonly known as: HYDRODIURIL Take 1 tablet (25 mg total) by mouth daily.   levothyroxine 100 MCG tablet Commonly known as: Synthroid Take 1 tablet (100 mcg total) by mouth daily before breakfast.   Vitamin D (Ergocalciferol) 1.25 MG (50000 UT) Caps capsule Commonly known as: DRISDOL Take 1 capsule (50,000 Units total) by mouth every 14 (fourteen) days.       All past medical history, surgical history, allergies, family history, immunizations andmedications were updated in the EMR today and reviewed under the history and medication portions of their EMR.     No results found for this or any previous visit (from the past 2160 hour(s)).  No results found.   ROS: 14 pt review of systems performed and negative (unless mentioned in an HPI)  Objective: BP 125/85 (BP Location: Right Arm, Patient Position: Sitting, Cuff Size: Normal)   Pulse 68   Temp (!) 97.5 F (36.4 C) (Temporal)   Resp 17   Ht 5\' 4"  (1.626 m)   Wt 186 lb 2 oz (84.4 kg)   SpO2 97%   BMI 31.95 kg/m  Gen: Afebrile. No acute distress. Nontoxic in appearance, well-developed, well-nourished, obese, pleasant, Caucasian female HENT: AT. [place]. Bilateral TM visualized and normal in appearance, normal external auditory  canal. MMM, no oral lesions, adequate dentition. Bilateral nares within normal limits. Throat without erythema, ulcerations or exudates.  No cough on exam, no hoarseness on exam. Eyes:Pupils Equal Round Reactive to light, Extraocular movements intact,  Conjunctiva without redness, discharge or icterus. Neck/lymp/endocrine: Supple, no lymphadenopathy, no thyromegaly CV: RRR no murmur, no edema, +2/4 P posterior tibialis pulses.  No carotid bruits. No JVD. Chest: CTAB, no wheeze, rhonchi or crackles.  Normal respiratory effort.  Good air movement. Abd: Soft.  Obese. NTND. BS present.  No masses palpated. No hepatosplenomegaly. No rebound tenderness or guarding. Skin: No rashes, purpura or petechiae. Warm and well-perfused. Skin intact. Neuro/Msk:  Normal gait. PERLA. EOMi. Alert.  Oriented x3.  Cranial nerves II through XII intact. Muscle strength 5/5 upper/lower extremity. DTRs equal bilaterally. Psych: Normal affect, dress and demeanor. Normal speech. Normal thought content and judgment.   No exam data present  Assessment/plan: ARLEN LEGENDRE is a 73 y.o. female present for CPE  Elevated hemoglobin A1c - Hemoglobin A1c Breast cancer screening by mammogram - MM 3D SCREEN BREAST BILATERAL; Future Need for hepatitis C screening test - Hepatitis C Antibody Osteoporosis without current pathological fracture, unspecified osteoporosis type - DG Bone Density; Future Encounter for preventive health examination Patient was encouraged to exercise greater than 150 minutes a week. Patient was encouraged to choose a diet filled with fresh fruits and vegetables, and lean meats. AVS provided to patient today for education/recommendation on gender specific health and safety maintenance. Colonoscopy: completed 2011. She is checking with her insurance to find out exact date so she can have this scheduled.  Mammogram: completed:07/22/2016, ordered today..  Immunizations: tdap up-to-date 2014, Influenza  up-to-date 2019, only low-dose shall be administered (encouraged yearly), PNA series Prevnar 13 completed 2017 would like to wait on Pneumovax 23-declined today, Shingrix declined Infectious disease screening: Hep C screening completed today DEXA: last completed 07/22/2016 T score -3.4, osteoporosis.>>ordered  Essential hypertension/HLD/morbid obesity Stable- refills provided.  - cbc, cmp, lipids collected - Low-sodium, exercise.  -Continue Norvasc 10 mg daily.  Refills provided today. -Continue HCTZ 25 mg daily.  Refills provided today. -Lipids collected today. - Follow-up 6 months  Acquired hypothyroidism Stable- TSH/T4 collected and refills provided after results TSH NL 03/2018.  Synthroid 100 mcg daily refilled today.  Return in about 1 year (around 12/26/2019) for CPE (30 min). 6 mos on [facility]  Electronically signed by: Howard Pouch, DO [place]

## 2018-12-27 LAB — HEPATITIS C ANTIBODY
Hepatitis C Ab: NONREACTIVE
SIGNAL TO CUT-OFF: 0.02 (ref <1.00)

## 2018-12-28 ENCOUNTER — Telehealth: Payer: Self-pay | Admitting: Family Medicine

## 2018-12-28 DIAGNOSIS — E039 Hypothyroidism, unspecified: Secondary | ICD-10-CM

## 2018-12-28 MED ORDER — LEVOTHYROXINE SODIUM 100 MCG PO TABS: 100.0000 ug | ORAL_TABLET | Freq: Every day | ORAL | 3 refills | Status: DC

## 2018-12-28 NOTE — Telephone Encounter (Signed)
Please inform patient the following information: Her labs are all normal and look great.

## 2018-12-28 NOTE — Telephone Encounter (Signed)
Pt was called and given lab results, she verbalized understanding.  

## 2019-01-01 ENCOUNTER — Telehealth: Payer: Self-pay

## 2019-01-01 NOTE — Telephone Encounter (Signed)
Copied from [place] [phone]. Topic: General - Other >> Jan 01, 2019 11:44 AM Celene Kras A wrote: Reason for CRM: Pt called and is requesting to know her blood type. Please advise.  Pt was called and told we do not have that information and she can get if she has ever donated blood or had a major surgery. Pt verbalized understanding

## 2019-01-02 ENCOUNTER — Encounter: Payer: Managed Care, Other (non HMO) | Admitting: Family Medicine

## 2019-01-19 ENCOUNTER — Encounter: Payer: Self-pay | Admitting: Family Medicine

## 2019-01-19 LAB — HM MAMMOGRAPHY

## 2019-01-23 ENCOUNTER — Telehealth: Payer: Self-pay

## 2019-01-23 NOTE — Telephone Encounter (Signed)
[facility] mammogram report. Bi-RAD-Negative. Abstracted into chart. Placed on Dr Dierdre Highman desk for review.

## 2019-01-24 NOTE — Telephone Encounter (Signed)
Please inform patient the following information: Her mammogram is normal.   

## 2019-01-24 NOTE — Telephone Encounter (Signed)
Detailed message left on Cell, okay per DPR.

## 2019-01-25 ENCOUNTER — Telehealth: Payer: Self-pay

## 2019-01-25 MED ORDER — ALENDRONATE SODIUM 70 MG PO TABS: 70.0000 mg | ORAL_TABLET | ORAL | 11 refills | Status: DC

## 2019-01-25 NOTE — Addendum Note (Signed)
Addended by: Howard Pouch A on: 01/25/2019 02:39 PM   Modules accepted: Orders

## 2019-01-25 NOTE — Telephone Encounter (Signed)
Pt was called and given information/instructions, she verbalized understanding. Pt would like to start Fosamax weekly and sent to [place]

## 2019-01-25 NOTE — Telephone Encounter (Signed)
Prescription sent

## 2019-01-25 NOTE — Telephone Encounter (Signed)
[facility] bone density report- Pt has osteoporosis- See report in chart. Placed on Dr Dierdre Highman desk to review.

## 2019-01-25 NOTE — Telephone Encounter (Signed)
Please inform patient the following information: Her bone density results did show evidence of osteoporosis of her left wrist and osteopenia of her right and left hip. Daily routine walking on a hard surface and weightbearing exercises help prevent progression of osteoporosis.  As well as adequate intake of calcium and vitamin D.  Recommendations are calcium 1200 mg daily and vitamin D at least 1000 units daily.  Medication such as Fosamax can also be used once weekly to help prevent progression.  If she would like to try this medication we will call this in for her.  Must be taken once a week with a full glass of water on an empty stomach.  Please advise

## 2019-03-06 ENCOUNTER — Telehealth: Payer: Self-pay | Admitting: Family Medicine

## 2019-03-06 ENCOUNTER — Other Ambulatory Visit: Payer: Self-pay

## 2019-03-06 DIAGNOSIS — E559 Vitamin D deficiency, unspecified: Secondary | ICD-10-CM

## 2019-03-06 MED ORDER — VITAMIN D (ERGOCALCIFEROL) 1.25 MG (50000 UNIT) PO CAPS: 50000.0000 [IU] | ORAL_CAPSULE | ORAL | 1 refills | Status: DC

## 2019-03-06 NOTE — Telephone Encounter (Signed)
Sent medication in for patient. Tried calling patient but was unable to leave a voicemail

## 2019-03-06 NOTE — Telephone Encounter (Signed)
Patient requesting Rx Vitamin D, Ergocalciferol, (DRISDOL) 1.25 MG (50000 UT) CAPS capsule. Patient would like to switch pharmacy to [place]

## 2019-06-27 ENCOUNTER — Telehealth: Payer: Self-pay

## 2019-06-27 NOTE — Telephone Encounter (Signed)
Please see other note

## 2019-06-27 NOTE — Telephone Encounter (Signed)
Her insurance company will not pay for brand Synthroid. Patient is stating that her insurance company has faxed something to Dr. Raoul Pitch in the last 1-2 weeks regarding this medication.  Please advise. [phone]

## 2019-06-27 NOTE — Telephone Encounter (Signed)
Pt was called and she needs PA saying she is unable to take the generic and has to have the name brand.   PA started: Alexis Beard (Key: [id number]). After doing PA it states that pt does not need a PA for medication.   Started an Exception for Tier medication form. This was faxed to her insurance company. Noted on form that pt can only take brand name Synthroid due to an intolerance to Levothyroxine.

## 2019-07-02 NOTE — Telephone Encounter (Signed)
Pt was called and she stated she received the same letter stating it was covered already. Next time she gets this filled she will call the insurance company about the cost, if its still expensive

## 2019-07-31 ENCOUNTER — Other Ambulatory Visit: Payer: Self-pay | Admitting: Family Medicine

## 2019-07-31 DIAGNOSIS — E559 Vitamin D deficiency, unspecified: Secondary | ICD-10-CM

## 2019-09-05 ENCOUNTER — Other Ambulatory Visit: Payer: Self-pay

## 2019-09-05 ENCOUNTER — Ambulatory Visit (INDEPENDENT_AMBULATORY_CARE_PROVIDER_SITE_OTHER): Payer: Medicare HMO | Admitting: Family Medicine

## 2019-09-05 ENCOUNTER — Encounter: Payer: Self-pay | Admitting: Family Medicine

## 2019-09-05 VITALS — BP 136/80 | HR 73 | Temp 97.4°F | Resp 18 | Ht 64.0 in | Wt 183.8 lb

## 2019-09-05 DIAGNOSIS — E669 Obesity, unspecified: Secondary | ICD-10-CM | POA: Diagnosis not present

## 2019-09-05 DIAGNOSIS — E559 Vitamin D deficiency, unspecified: Secondary | ICD-10-CM | POA: Diagnosis not present

## 2019-09-05 DIAGNOSIS — I10 Essential (primary) hypertension: Secondary | ICD-10-CM

## 2019-09-05 DIAGNOSIS — E039 Hypothyroidism, unspecified: Secondary | ICD-10-CM

## 2019-09-05 DIAGNOSIS — E785 Hyperlipidemia, unspecified: Secondary | ICD-10-CM

## 2019-09-05 DIAGNOSIS — G479 Sleep disorder, unspecified: Secondary | ICD-10-CM

## 2019-09-05 MED ORDER — ALENDRONATE SODIUM 70 MG PO TABS: 70.0000 mg | ORAL_TABLET | ORAL | 11 refills | Status: DC

## 2019-09-05 MED ORDER — AMLODIPINE BESYLATE 10 MG PO TABS: 10.0000 mg | ORAL_TABLET | Freq: Every day | ORAL | 1 refills | Status: DC

## 2019-09-05 MED ORDER — HYDROCHLOROTHIAZIDE 25 MG PO TABS: 25.0000 mg | ORAL_TABLET | Freq: Every day | ORAL | 1 refills | Status: DC

## 2019-09-05 MED ORDER — VITAMIN D (ERGOCALCIFEROL) 1.25 MG (50000 UNIT) PO CAPS: 50000.0000 [IU] | ORAL_CAPSULE | ORAL | 2 refills | Status: DC

## 2019-09-05 MED ORDER — TRAZODONE HCL 50 MG PO TABS: 25.0000 mg | ORAL_TABLET | Freq: Every evening | ORAL | 5 refills | Status: DC | PRN

## 2019-09-05 MED ORDER — LEVOTHYROXINE SODIUM 100 MCG PO TABS: 100.0000 ug | ORAL_TABLET | Freq: Every day | ORAL | 0 refills | Status: DC

## 2019-09-05 NOTE — Progress Notes (Signed)
Patient ID: Alexis Beard, female  DOB: 1945/06/11, 74 y.o.   MRN: [id number] Patient Care Team    Relationship Specialty Notifications Start End  Ma Hillock, DO PCP - General Family Medicine  12/20/16   Rolm Bookbinder, MD Consulting Physician Dermatology  12/22/16   Elayne Snare, MD Consulting Physician Endocrinology  12/22/16     Chief Complaint  Patient presents with  . Hypertension    Pt stated she doesn't check BPs at home.  . Follow-up  . Insomnia    Pt states having trouble sleeping and would like recommendation for OTC medication.    Subjective: Alexis Beard is a 74 y.o.  Female  present for [facility] with acute concern Hypertension/morbid obesity: Pt reports  compliance with amlodipine 10 mg daily and HCTZ 25 mg a day.  She had also been prescribed ramipril in the past, but she states she hasn't taken this in a few years because she was worried it was going to make her dizzy, she does not want to restart ramipril. Patient denies chest pain, shortness of breath, dizziness or lower extremity edema.  Pt does not take a daily baby ASA. Pt is not  prescribed statin. Labs up-to-date 12/26/2018-we will update labs next visit. Diet: Low-sodium diet  Exercise: Not routinely RF: Hypertension, family history present, obesity  Acquired hypothyroidism tsh normal 12/26/2018.  Patient reports compliance with Synthroid (name brand) 100 mcg daily on an empty stomach.  Sleep disturbance: Patient reports she has difficulty falling asleep.  She states it really started only this past year with all the turmoil in the world and the Covid pandemic.  She finds herself having difficulty falling asleep because she is thinking.  She also reports she wakes up quite a few times throughout the night and has difficulty falling back asleep.    Depression screen [facility] 2/9 12/26/2018 04/12/2018 01/12/2017 12/20/2016  Decreased Interest 0 0 0 0  Down, Depressed, Hopeless 0 0 0 0  PHQ - 2 Score 0 0 0 0  Altered  sleeping - 0 - -  Tired, decreased energy - 0 - -  Change in appetite - 0 - -  Feeling bad or failure about yourself  - 0 - -  Trouble concentrating - 0 - -  Moving slowly or fidgety/restless - 0 - -  Suicidal thoughts - 0 - -  PHQ-9 Score - 0 - -  Difficult doing work/chores - Not difficult at all - -   No flowsheet data found.   Immunization History  Administered Date(s) Administered  . Influenza, High Dose Seasonal PF 04/11/2018  . Pneumococcal Conjugate-13 08/25/2015  . Tdap 08/27/2012     Past Medical History:  Diagnosis Date  . Anemia   . Arthritis   . Basal cell carcinoma (BCC)    Basal cell Carcinoma  . Cataract   . Colon polyps   . Diverticulosis   . Hypertension   . Hypothyroid   . Migraines   . Osteoporosis    Allergies  Allergen Reactions  . Codeine Nausea And Vomiting   Past Surgical History:  Procedure Laterality Date  . BASAL CELL CARCINOMA EXCISION  09/2015   Back  . birth mark removal  1998   on (R) side of neck  . CARPAL TUNNEL RELEASE  1998  . TONSILLECTOMY  1956   Family History  Problem Relation Age of Onset  . Diabetes Mother   . Thyroid disease Mother   . Heart disease Mother  29  . Hyperlipidemia Father   . Heart disease Father 45  . Hyperlipidemia Sister   . Cancer Maternal Aunt        Breast Cancer  . Thyroid disease Maternal Aunt   . Melanoma Maternal Aunt    Social History   Social History Narrative   Married to [place], has children.   High school graduate.   Drinks caffeine.   Wears her seatbelt, wears a bicycle helmet.   Wears glasses.   Smoke detector in the home, firearms locked in the home.   Feels safe in her relationships.   Right handed.    Allergies as of 09/05/2019      Reactions   Codeine Nausea And Vomiting      Medication List       Accurate as of September 05, 2019 10:19 AM. If you have any questions, ask your nurse or doctor.        alendronate 70 MG tablet Commonly known as: FOSAMAX Take 1  tablet (70 mg total) by mouth every 7 (seven) days. Take with a full glass of water on an empty stomach.   amLODipine 10 MG tablet Commonly known as: NORVASC Take 1 tablet (10 mg total) by mouth daily.   hydrochlorothiazide 25 MG tablet Commonly known as: HYDRODIURIL Take 1 tablet (25 mg total) by mouth daily.   levothyroxine 100 MCG tablet Commonly known as: Synthroid Take 1 tablet (100 mcg total) by mouth daily before breakfast.   Vitamin D (Ergocalciferol) 1.25 MG (50000 UNIT) Caps capsule Commonly known as: DRISDOL TAKE 1 CAPSULE (50,000 UNITS TOTAL) BY MOUTH EVERY 14 (FOURTEEN) DAYS.       All past medical history, surgical history, allergies, family history, immunizations andmedications were updated in the EMR today and reviewed under the history and medication portions of their EMR.     No results found for this or any previous visit (from the past 2160 hour(s)).  No results found.   ROS: 14 pt review of systems performed and negative (unless mentioned in an HPI)  Objective: BP 136/80 (BP Location: Right Arm, Patient Position: Sitting, Cuff Size: Normal)   Pulse 73   Temp (!) 97.4 F (36.3 C) (Temporal)   Resp 18   Ht 5\' 4"  (1.626 m)   Wt 183 lb 12.8 oz (83.4 kg)   SpO2 96%   BMI 31.55 kg/m  Gen: Afebrile. No acute distress.  HENT: AT. [place].  Eyes:Pupils Equal Round Reactive to light, Extraocular movements intact,  Conjunctiva without redness, discharge or icterus. Neck/lymp/endocrine: Supple, no lymphadenopathy, no thyromegaly CV: RRR no murmur, no edema, +2/4 P posterior tibialis pulses Chest: CTAB, no wheeze or crackles Abd: Soft. NTND. BS present.  No masses palpated.  Skin: No rashes, purpura or petechiae.  Neuro:  Normal gait. PERLA. EOMi. Alert. Oriented x3  Psych: Normal affect, dress and demeanor. Normal speech. Normal thought content and judgment  No exam data present  Assessment/plan: Alexis Beard is a 74 y.o. female present for [facility] Essential  hypertension/HLD/morbid obesity Stable. - Low-sodium, exercise.  -Continue amlodipine 10 mg daily. -Continue HCTZ 25 mg daily.   Follow-up into September for CPE/chronic medical conditions and fasting labs.  Acquired hypothyroidism Stable . Continue Synthroid 100 mcg daily. Labs due next visit-end of September.  Insomnia: Patient with new onset difficulty falling asleep and staying asleep.  Seems to be stemming from some anxiety around the turmoil politically and with the pandemic. Discussed options with her today and she would like  to try trazodone.  Taper instructions were explained to her and provided in written format on her AVS.  Patient reported understanding. As long as doing well, follow-up with her CPE at the end of September.  No follow-ups on file.  No orders of the defined types were placed in this encounter.   Meds ordered this encounter  Medications  . amLODipine (NORVASC) 10 MG tablet    Sig: Take 1 tablet (10 mg total) by mouth daily.    Dispense:  90 tablet    Refill:  1  . alendronate (FOSAMAX) 70 MG tablet    Sig: Take 1 tablet (70 mg total) by mouth every 7 (seven) days. Take with a full glass of water on an empty stomach.    Dispense:  4 tablet    Refill:  11  . hydrochlorothiazide (HYDRODIURIL) 25 MG tablet    Sig: Take 1 tablet (25 mg total) by mouth daily.    Dispense:  90 tablet    Refill:  1  . DISCONTD: levothyroxine (SYNTHROID) 100 MCG tablet    Sig: Take 1 tablet (100 mcg total) by mouth daily before breakfast.    Dispense:  90 tablet    Refill:  0    Please add to existing refills.  . Vitamin D, Ergocalciferol, (DRISDOL) 1.25 MG (50000 UNIT) CAPS capsule    Sig: Take 1 capsule (50,000 Units total) by mouth every 14 (fourteen) days.    Dispense:  6 capsule    Refill:  2    **APPOINTMENT NEEDED**  . traZODone (DESYREL) 50 MG tablet    Sig: Take 0.5-1 tablets (25-50 mg total) by mouth at bedtime as needed for sleep.    Dispense:  30 tablet      Refill:  5  . DISCONTD: levothyroxine (SYNTHROID) 100 MCG tablet    Sig: Take 1 tablet (100 mcg total) by mouth daily before breakfast.    Dispense:  90 tablet    Refill:  0    Name brand.  Marland Kitchen levothyroxine (SYNTHROID) 100 MCG tablet    Sig: Take 1 tablet (100 mcg total) by mouth daily before breakfast.    Dispense:  90 tablet    Refill:  1    Name brand.    Referral Orders  No referral(s) requested today    Electronically signed by: Howard Pouch, [place]

## 2019-09-05 NOTE — Patient Instructions (Addendum)
I have refilled your medications today.  Start trazodone 1/2 tababout 30 minutes before bed, after 3 days if still not sleeping well you can increase to 1 full tab before bed. After 3 days if still not sleeping well call in and we will instruct you on further tapering.   Follow up in 5.5 months for physical and labs.

## 2019-09-06 MED ORDER — LEVOTHYROXINE SODIUM 100 MCG PO TABS: 100.0000 ug | ORAL_TABLET | Freq: Every day | ORAL | 1 refills | Status: DC

## 2019-09-27 ENCOUNTER — Other Ambulatory Visit: Payer: Self-pay | Admitting: Family Medicine

## 2019-09-27 DIAGNOSIS — G479 Sleep disorder, unspecified: Secondary | ICD-10-CM

## 2019-10-04 DIAGNOSIS — Z0100 Encounter for examination of eyes and vision without abnormal findings: Secondary | ICD-10-CM | POA: Diagnosis not present

## 2019-10-04 DIAGNOSIS — E7800 Pure hypercholesterolemia, unspecified: Secondary | ICD-10-CM | POA: Diagnosis not present

## 2019-10-04 DIAGNOSIS — I10 Essential (primary) hypertension: Secondary | ICD-10-CM | POA: Diagnosis not present

## 2019-10-04 DIAGNOSIS — H5213 Myopia, bilateral: Secondary | ICD-10-CM | POA: Diagnosis not present

## 2019-12-27 ENCOUNTER — Encounter: Payer: Self-pay | Admitting: Family Medicine

## 2019-12-27 ENCOUNTER — Ambulatory Visit (INDEPENDENT_AMBULATORY_CARE_PROVIDER_SITE_OTHER): Payer: Medicare HMO | Admitting: Family Medicine

## 2019-12-27 ENCOUNTER — Ambulatory Visit (INDEPENDENT_AMBULATORY_CARE_PROVIDER_SITE_OTHER): Payer: Medicare HMO

## 2019-12-27 ENCOUNTER — Other Ambulatory Visit: Payer: Self-pay

## 2019-12-27 VITALS — BP 132/77 | HR 73 | Temp 98.0°F | Ht 62.25 in | Wt 184.2 lb

## 2019-12-27 DIAGNOSIS — M546 Pain in thoracic spine: Secondary | ICD-10-CM | POA: Diagnosis not present

## 2019-12-27 DIAGNOSIS — S299XXA Unspecified injury of thorax, initial encounter: Secondary | ICD-10-CM | POA: Diagnosis not present

## 2019-12-27 DIAGNOSIS — M810 Age-related osteoporosis without current pathological fracture: Secondary | ICD-10-CM | POA: Diagnosis not present

## 2019-12-27 DIAGNOSIS — M47814 Spondylosis without myelopathy or radiculopathy, thoracic region: Secondary | ICD-10-CM | POA: Diagnosis not present

## 2019-12-27 MED ORDER — NAPROXEN 500 MG PO TABS: 500.0000 mg | ORAL_TABLET | Freq: Two times a day (BID) | ORAL | 0 refills | Status: DC

## 2019-12-27 NOTE — Patient Instructions (Signed)
Start naproxen every 12 hours with food for 5-7 days to help decrease inflammation.  Please have and xray completed at [facility] today.   [facility] is located at [address] [place], [place] [address]   I will call you with the results as soon as we get them.     Rib Fracture  A rib fracture is a break or crack in one of the bones of the ribs. The ribs are like a cage that goes around your upper chest. A broken or cracked rib is often painful, but most do not cause other problems. Most rib fractures usually heal on their own in 1-3 months. Follow these instructions at home: Managing pain, stiffness, and swelling  If directed, apply ice to the injured area. ? Put ice in a plastic bag. ? Place a towel between your skin and the bag. ? Leave the ice on for 20 minutes, 2-3 times a day.  Take over-the-counter and prescription medicines only as told by your doctor. Activity  Avoid activities that cause pain to the injured area. Protect your injured area.  Slowly increase activity as told by your doctor. General instructions  Do deep breathing as told by your doctor. You may be told to: ? Take deep breaths many times a day. ? Cough many times a day while hugging a pillow. ? Use a device (incentive spirometer) to do deep breathing many times a day.  Drink enough fluid to keep your pee (urine) clear or pale yellow.  Do not wear a rib belt or binder. These do not allow you to breathe deeply.  Keep all follow-up visits as told by your doctor. This is important. Contact a doctor if:  You have a fever. Get help right away if:  You have trouble breathing.  You are short of breath.  You cannot stop coughing.  You cough up thick or bloody spit (sputum).  You feel sick to your stomach (nauseous), throw up (vomit), or have belly (abdominal) pain.  Your pain gets worse and medicine does not help. Summary  A rib fracture is a break or crack in one of the  bones of the ribs.  Apply ice to the injured area and take medicines for pain as told by your doctor.  Take deep breaths and cough many times a day. Hug a pillow every time you cough. This information is not intended to replace advice given to you by your health care provider. Make sure you discuss any questions you have with your health care provider. Document Revised: 04/15/2017 Document Reviewed: 08/03/2016 Elsevier Patient Education  2020 [employer].

## 2019-12-27 NOTE — Progress Notes (Signed)
This visit occurred during the SARS-CoV-2 public health emergency.  Safety protocols were in place, including screening questions prior to the visit, additional usage of staff PPE, and extensive cleaning of exam room while observing appropriate contact time as indicated for disinfecting solutions.    Alexis Beard , 1946/04/17, 74 y.o., female MRN: [id number] Patient Care Team    Relationship Specialty Notifications Start End  Ma Hillock, DO PCP - General Family Medicine  12/20/16   Rolm Bookbinder, MD Consulting Physician Dermatology  12/22/16   Elayne Snare, MD Consulting Physician Endocrinology  12/22/16     Chief Complaint  Patient presents with  . Back Pain    grandson swung door and hit her in the back     Subjective: Pt presents for an OV with complaints of resting back pain of 2 weeks duration. Patient reports she was watching her grandson and he swung open the storm door and the door came back and hit her in the right side of her thoracic area. Since that time she has had discomfort in her back. She notices with twisting motion she has pain. She has pain when taking a deep breath. She reports she even has pain when going over bumps in the car at this location. She did not notice any initial bruising over the area but does feel it is swollen and feels tight at that location. She does have a history of osteoporosis.  Pt has tried Aspercreme to ease their symptoms. And it does seem to be helpful at times.  Depression screen [facility] 2/9 12/27/2019 12/26/2018 04/12/2018 01/12/2017 12/20/2016  Decreased Interest 0 0 0 0 0  Down, Depressed, Hopeless 0 0 0 0 0  PHQ - 2 Score 0 0 0 0 0  Altered sleeping - - 0 - -  Tired, decreased energy - - 0 - -  Change in appetite - - 0 - -  Feeling bad or failure about yourself  - - 0 - -  Trouble concentrating - - 0 - -  Moving slowly or fidgety/restless - - 0 - -  Suicidal thoughts - - 0 - -  PHQ-9 Score - - 0 - -  Difficult doing work/chores - - Not  difficult at all - -    Allergies  Allergen Reactions  . Codeine Nausea And Vomiting   Social History   Social History Narrative   Married to [place], has children.   High school graduate.   Drinks caffeine.   Wears her seatbelt, wears a bicycle helmet.   Wears glasses.   Smoke detector in the home, firearms locked in the home.   Feels safe in her relationships.   Right handed.   Past Medical History:  Diagnosis Date  . Anemia   . Arthritis   . Basal cell carcinoma (BCC)    Basal cell Carcinoma  . Cataract   . Colon polyps   . Diverticulosis   . Hypertension   . Hypothyroid   . Migraines   . Osteoporosis    Past Surgical History:  Procedure Laterality Date  . BASAL CELL CARCINOMA EXCISION  09/2015   Back  . birth mark removal  1998   on (R) side of neck  . CARPAL TUNNEL RELEASE  1998  . TONSILLECTOMY  1956   Family History  Problem Relation Age of Onset  . Diabetes Mother   . Thyroid disease Mother   . Heart disease Mother 37  . Hyperlipidemia Father   .  Heart disease Father 35  . Hyperlipidemia Sister   . Cancer Maternal Aunt        Breast Cancer  . Thyroid disease Maternal Aunt   . Melanoma Maternal Aunt    Allergies as of 12/27/2019      Reactions   Codeine Nausea And Vomiting      Medication List       Accurate as of December 27, 2019  4:41 PM. If you have any questions, ask your nurse or doctor.        alendronate 70 MG tablet Commonly known as: FOSAMAX Take 1 tablet (70 mg total) by mouth every 7 (seven) days. Take with a full glass of water on an empty stomach.   amLODipine 10 MG tablet Commonly known as: NORVASC Take 1 tablet (10 mg total) by mouth daily.   hydrochlorothiazide 25 MG tablet Commonly known as: HYDRODIURIL Take 1 tablet (25 mg total) by mouth daily. What changed:   when to take this  reasons to take this  additional instructions   levothyroxine 100 MCG tablet Commonly known as: Synthroid Take 1 tablet (100 mcg  total) by mouth daily before breakfast.   naproxen 500 MG tablet Commonly known as: Naprosyn Take 1 tablet (500 mg total) by mouth 2 (two) times daily with a meal. Started by: Howard Pouch, DO   traZODone 50 MG tablet Commonly known as: DESYREL Take 0.5-1 tablets (25-50 mg total) by mouth at bedtime as needed for sleep.   Vitamin D (Ergocalciferol) 1.25 MG (50000 UNIT) Caps capsule Commonly known as: DRISDOL Take 1 capsule (50,000 Units total) by mouth every 14 (fourteen) days.       All past medical history, surgical history, allergies, family history, immunizations andmedications were updated in the EMR today and reviewed under the history and medication portions of their EMR.     ROS: Negative, with the exception of above mentioned in HPI   Objective:  BP 132/77 (BP Location: Left Arm, Patient Position: Sitting, Cuff Size: Normal)   Pulse 73   Temp 98 F (36.7 C) (Oral)   Ht 5' 2.25" (1.581 m)   Wt 184 lb 3.2 oz (83.6 kg)   SpO2 97%   BMI 33.42 kg/m  Body mass index is 33.42 kg/m. Gen: Afebrile. No acute distress. Nontoxic in appearance, well developed, well nourished.  HENT: AT. [place].  Eyes:Pupils Equal Round Reactive to light, Extraocular movements intact,  Conjunctiva without redness, discharge or icterus. MSK: Thoracic spine: No erythema. Mild soft tissue swelling and petechiae noted over right thoracic area approximately 1 inch below scapula at bra line. Tender to palpation over this area and rib shaft. No thoracic spine tenderness to palpation or step-off. Discomfort with rotation and side bending present. Neurovascularly intact distally. Neuro: Normal gait. PERLA. EOMi. Alert. Oriented x3  Psych: Normal affect, dress and demeanor. Normal speech. Normal thought content and judgment.  No exam data present No results found. No results found for this or any previous visit (from the past 24 hour(s)).  Assessment/Plan: [name] is a 74 y.o. female present for OV  for  Acute right-sided thoracic back pain/history of osteoporosis Patient has rather significant discomfort to palpation over right mid thoracic area, mostly focused over posterior ribs. There is some mild swelling over this location. Given her history of osteoporosis concern for possible rib fracture. Naproxen twice daily x5-7 days with food encouraged for pain. Can continue Aspercreme as needed Offered muscle relaxant-however patient is intolerant to muscle relaxants. Encouraged  her to try heat application few times a day for 15-20 minutes. - DG Thoracic Spine W/Swimmers; Future - DG Ribs Unilateral Right; Future Patient will be called with x-ray results once available and further plan discussed at that time.   Reviewed expectations re: course of current medical issues.  Discussed self-management of symptoms.  Outlined signs and symptoms indicating need for more acute intervention.  Patient verbalized understanding and all questions were answered.  Patient received an After-Visit Summary.    Orders Placed This Encounter  Procedures  . DG Thoracic Spine W/Swimmers  . DG Ribs Unilateral Right   Meds ordered this encounter  Medications  . naproxen (NAPROSYN) 500 MG tablet    Sig: Take 1 tablet (500 mg total) by mouth 2 (two) times daily with a meal.    Dispense:  30 tablet    Refill:  0   Referral Orders  No referral(s) requested today     Note is dictated utilizing voice recognition software. Although note has been proof read prior to signing, occasional typographical errors still can be missed. If any questions arise, please do not hesitate to call for verification.   electronically signed by:  Howard Pouch, DO  [place]

## 2020-02-11 ENCOUNTER — Ambulatory Visit: Payer: Medicare HMO | Admitting: Family Medicine

## 2020-02-19 ENCOUNTER — Encounter: Payer: Self-pay | Admitting: Family Medicine

## 2020-02-19 ENCOUNTER — Other Ambulatory Visit: Payer: Self-pay

## 2020-02-19 ENCOUNTER — Ambulatory Visit (INDEPENDENT_AMBULATORY_CARE_PROVIDER_SITE_OTHER): Payer: Medicare HMO | Admitting: Family Medicine

## 2020-02-19 VITALS — BP 128/77 | HR 67 | Temp 98.6°F | Ht 65.0 in | Wt 183.0 lb

## 2020-02-19 DIAGNOSIS — I10 Essential (primary) hypertension: Secondary | ICD-10-CM | POA: Diagnosis not present

## 2020-02-19 DIAGNOSIS — Z23 Encounter for immunization: Secondary | ICD-10-CM

## 2020-02-19 DIAGNOSIS — G479 Sleep disorder, unspecified: Secondary | ICD-10-CM | POA: Diagnosis not present

## 2020-02-19 DIAGNOSIS — E559 Vitamin D deficiency, unspecified: Secondary | ICD-10-CM

## 2020-02-19 DIAGNOSIS — E039 Hypothyroidism, unspecified: Secondary | ICD-10-CM | POA: Diagnosis not present

## 2020-02-19 DIAGNOSIS — E669 Obesity, unspecified: Secondary | ICD-10-CM | POA: Diagnosis not present

## 2020-02-19 DIAGNOSIS — Z1211 Encounter for screening for malignant neoplasm of colon: Secondary | ICD-10-CM | POA: Diagnosis not present

## 2020-02-19 DIAGNOSIS — E785 Hyperlipidemia, unspecified: Secondary | ICD-10-CM | POA: Diagnosis not present

## 2020-02-19 DIAGNOSIS — Z131 Encounter for screening for diabetes mellitus: Secondary | ICD-10-CM

## 2020-02-19 LAB — CBC
HCT: 40.3 % (ref 36.0–46.0)
Hemoglobin: 13.4 g/dL (ref 12.0–15.0)
MCHC: 33.1 g/dL (ref 30.0–36.0)
MCV: 89.7 fl (ref 78.0–100.0)
Platelets: 296 10*3/uL (ref 150.0–400.0)
RBC: 4.5 Mil/uL (ref 3.87–5.11)
RDW: 13.7 % (ref 11.5–15.5)
WBC: 6.1 10*3/uL (ref 4.0–10.5)

## 2020-02-19 LAB — COMPREHENSIVE METABOLIC PANEL
ALT: 11 U/L (ref 0–35)
AST: 13 U/L (ref 0–37)
Albumin: 4.2 g/dL (ref 3.5–5.2)
Alkaline Phosphatase: 63 U/L (ref 39–117)
BUN: 12 mg/dL (ref 6–23)
CO2: 29 mEq/L (ref 19–32)
Calcium: 9 mg/dL (ref 8.4–10.5)
Chloride: 103 mEq/L (ref 96–112)
Creatinine, Ser: 0.76 mg/dL (ref 0.40–1.20)
GFR: 77.25 mL/min (ref >60.00)
Glucose, Bld: 90 mg/dL (ref 70–99)
Potassium: 4.2 mEq/L (ref 3.5–5.1)
Sodium: 140 mEq/L (ref 135–145)
Total Bilirubin: 0.6 mg/dL (ref 0.2–1.2)
Total Protein: 7 g/dL (ref 6.0–8.3)

## 2020-02-19 LAB — LIPID PANEL
Cholesterol: 194 mg/dL (ref 0–200)
HDL: 71.4 mg/dL (ref >39.00)
LDL Cholesterol: 104 mg/dL — ABNORMAL HIGH (ref 0–99)
NonHDL: 122.83
Total CHOL/HDL Ratio: 3
Triglycerides: 93 mg/dL (ref 0.0–149.0)
VLDL: 18.6 mg/dL (ref 0.0–40.0)

## 2020-02-19 LAB — TSH: TSH: 0.39 u[IU]/mL (ref 0.35–4.50)

## 2020-02-19 LAB — HEMOGLOBIN A1C: Hgb A1c MFr Bld: 5.8 % (ref 4.6–6.5)

## 2020-02-19 LAB — VITAMIN D 25 HYDROXY (VIT D DEFICIENCY, FRACTURES): VITD: 43.7 ng/mL (ref 30.00–100.00)

## 2020-02-19 MED ORDER — LEVOTHYROXINE SODIUM 100 MCG PO TABS: 100.0000 ug | ORAL_TABLET | Freq: Every day | ORAL | 3 refills | Status: DC

## 2020-02-19 MED ORDER — VITAMIN D (ERGOCALCIFEROL) 1.25 MG (50000 UNIT) PO CAPS: 50000.0000 [IU] | ORAL_CAPSULE | ORAL | 4 refills | Status: DC

## 2020-02-19 MED ORDER — HYDROCHLOROTHIAZIDE 25 MG PO TABS: 25.0000 mg | ORAL_TABLET | Freq: Every day | ORAL | 1 refills | Status: DC

## 2020-02-19 MED ORDER — ALENDRONATE SODIUM 70 MG PO TABS
70.0000 mg | ORAL_TABLET | ORAL | 11 refills | Status: DC
Start: 2020-02-19 — End: 2020-08-05

## 2020-02-19 MED ORDER — AMLODIPINE BESYLATE 10 MG PO TABS: 10.0000 mg | ORAL_TABLET | Freq: Every day | ORAL | 1 refills | Status: DC

## 2020-02-19 MED ORDER — TRAZODONE HCL 50 MG PO TABS: 25.0000 mg | ORAL_TABLET | Freq: Every evening | ORAL | 5 refills | Status: DC | PRN

## 2020-02-19 NOTE — Patient Instructions (Addendum)
It was great to see you today.  I have refilled your medications.  Next appt in 5.5 months.   I have referred you to gastroenterology for colonoscopy.

## 2020-02-19 NOTE — Progress Notes (Signed)
Patient ID: HELAINE YACKEL, female  DOB: Jan 29, 1946, 74 y.o.   MRN: [id number] Patient Care Team    Relationship Specialty Notifications Start End  Ma Hillock, DO PCP - General Family Medicine  12/20/16   Rolm Bookbinder, MD Consulting Physician Dermatology  12/22/16   Elayne Snare, MD Consulting Physician Endocrinology  12/22/16     Chief Complaint  Patient presents with  . Follow-up    [facility]; agrees to do colonoscopy     Subjective: Alexis Beard is a 74 y.o.  Female  present for [facility] with acute concern Hypertension/morbid obesity: Pt reports  complaints with amlodipine 10 mg daily and HCTZ 25 mg a day.  Patient denies chest pain, shortness of breath, dizziness or lower extremity edema.  Pt does not take a daily baby ASA. Pt is not  prescribed statin. Diet: Low-sodium diet  Exercise: Not routinely RF: Hypertension, family history present, obesity  Acquired hypothyroidism tsh normal 12/26/2018.  Patient is due for labs today.  Patient reports compliance with Synthroid (name brand) 100 mcg daily on an empty stomach.  Sleep disturbance: Patient reports since starting trazodone her sleep pattern is much improved.  She typically takes a half a tab/25 mg nightly but occasionally will take a whole tablet 50 mg nightly.  Osteoporosis: Patient reports compliance with Fosamax 70 mg q. 7 days.  She is compliant with high-dose 50,000 units ergocalciferol every 14 days.  She does need refills on this today.  Depression screen [facility] 2/9 02/19/2020 12/27/2019 12/26/2018 04/12/2018 01/12/2017  Decreased Interest 0 0 0 0 0  Down, Depressed, Hopeless 0 0 0 0 0  PHQ - 2 Score 0 0 0 0 0  Altered sleeping - - - 0 -  Tired, decreased energy - - - 0 -  Change in appetite - - - 0 -  Feeling bad or failure about yourself  - - - 0 -  Trouble concentrating - - - 0 -  Moving slowly or fidgety/restless - - - 0 -  Suicidal thoughts - - - 0 -  PHQ-9 Score - - - 0 -  Difficult doing work/chores - - - Not  difficult at all -   No flowsheet data found.   Immunization History  Administered Date(s) Administered  . Influenza, High Dose Seasonal PF 04/11/2018  . Pneumococcal Conjugate-13 08/25/2015  . Tdap 08/27/2012    Past Medical History:  Diagnosis Date  . Anemia   . Arthritis   . Basal cell carcinoma (BCC)    Basal cell Carcinoma  . Cataract   . Colon polyps   . Diverticulosis   . Hypertension   . Hypothyroid   . Migraines   . Osteoporosis    Allergies  Allergen Reactions  . Codeine Nausea And Vomiting   Past Surgical History:  Procedure Laterality Date  . BASAL CELL CARCINOMA EXCISION  09/2015   Back  . birth mark removal  1998   on (R) side of neck  . CARPAL TUNNEL RELEASE  1998  . TONSILLECTOMY  1956   Family History  Problem Relation Age of Onset  . Diabetes Mother   . Thyroid disease Mother   . Heart disease Mother 69  . Hyperlipidemia Father   . Heart disease Father 48  . Hyperlipidemia Sister   . Cancer Maternal Aunt        Breast Cancer  . Thyroid disease Maternal Aunt   . Melanoma Maternal Aunt    Social History  Social History Narrative   Married to [place], has children.   High school graduate.   Drinks caffeine.   Wears her seatbelt, wears a bicycle helmet.   Wears glasses.   Smoke detector in the home, firearms locked in the home.   Feels safe in her relationships.   Right handed.    Allergies as of 02/19/2020      Reactions   Codeine Nausea And Vomiting      Medication List       Accurate as of February 19, 2020 11:03 AM. If you have any questions, ask your nurse or doctor.        STOP taking these medications   naproxen 500 MG tablet Commonly known as: Naprosyn Stopped by: Howard Pouch, DO     TAKE these medications   alendronate 70 MG tablet Commonly known as: FOSAMAX Take 1 tablet (70 mg total) by mouth every 7 (seven) days. Take with a full glass of water on an empty stomach.   amLODipine 10 MG tablet Commonly known  as: NORVASC Take 1 tablet (10 mg total) by mouth daily.   hydrochlorothiazide 25 MG tablet Commonly known as: HYDRODIURIL Take 1 tablet (25 mg total) by mouth daily. What changed:   when to take this  reasons to take this  additional instructions   levothyroxine 100 MCG tablet Commonly known as: Synthroid Take 1 tablet (100 mcg total) by mouth daily before breakfast.   traZODone 50 MG tablet Commonly known as: DESYREL Take 0.5-1 tablets (25-50 mg total) by mouth at bedtime as needed for sleep.   Vitamin D (Ergocalciferol) 1.25 MG (50000 UNIT) Caps capsule Commonly known as: DRISDOL Take 1 capsule (50,000 Units total) by mouth every 14 (fourteen) days.       All past medical history, surgical history, allergies, family history, immunizations andmedications were updated in the EMR today and reviewed under the history and medication portions of their EMR.     No results found for this or any previous visit (from the past 2160 hour(s)).  No results found.   ROS: 14 pt review of systems performed and negative (unless mentioned in an HPI)  Objective: BP 128/77   Pulse 67   Temp 98.6 F (37 C) (Oral)   Ht $R'5\' 5"'mt$  (1.651 m)   Wt 183 lb (83 kg)   SpO2 98%   BMI 30.45 kg/m  Gen: Afebrile. No acute distress. Nontoxic.  Pleasant female HENT: AT. [place].  No cough no shortness of breath  eyes:Pupils Equal Round Reactive to light, Extraocular movements intact,  Conjunctiva without redness, discharge or icterus. Neck/lymp/endocrine: Supple, no lymphadenopathy, no thyromegaly CV: RRR no murmur, no edema, +2/4 P posterior tibialis pulses Chest: CTAB, no wheeze or crackles Abd: Soft. NTND. BS present.  Skin: No rashes, purpura or petechiae.  Neuro:  Normal gait. PERLA. EOMi. Alert. Oriented x3  Psych: Normal affect, dress and demeanor. Normal speech. Normal thought content and judgment.   No exam data present  Assessment/plan: Alexis Beard is a 74 y.o. female present for  [facility] Essential hypertension/HLD/morbid obesity Stable. - Low-sodium, exercise.  -Continue amlodipine 10 mg daily. -Continue HCTZ 25 mg daily.   CBC, CMP, lipids, TSH collected today  Acquired hypothyroidism Has been stable.  TSH collected today.  Patient's Synthroid will be refilled after laboratory results received. If normal continue Synthroid 100 mcg daily, if abnormal dose will be adjusted  Insomnia: Stable. Continue trazodone 25-50 mg nightly  Diabetes screen: A1c collected today  Colon  cancer screening: Referral placed to gastroenterology per her request today.  Patient states her 10-year follow-up was due 2020.  Patient understands she may need to collect records-which she has had difficulty obtaining in the past from prior GI in [facility] .  Need for pneumococcal vaccine. Pneumovax 23 provided to patient today.  No follow-ups on file.  Orders Placed This Encounter  Procedures  . Vitamin D (25 hydroxy)  . CBC  . Comp Met (CMET)  . TSH  . Hemoglobin A1c  . Lipid panel    Meds ordered this encounter  Medications  . traZODone (DESYREL) 50 MG tablet    Sig: Take 0.5-1 tablets (25-50 mg total) by mouth at bedtime as needed for sleep.    Dispense:  30 tablet    Refill:  5  . hydrochlorothiazide (HYDRODIURIL) 25 MG tablet    Sig: Take 1 tablet (25 mg total) by mouth daily.    Dispense:  90 tablet    Refill:  1  . amLODipine (NORVASC) 10 MG tablet    Sig: Take 1 tablet (10 mg total) by mouth daily.    Dispense:  90 tablet    Refill:  1    Referral Orders  No referral(s) requested today    Electronically signed by: Howard Pouch, [place]

## 2020-02-21 NOTE — Addendum Note (Signed)
Addended by: Kavin Leech on: 02/21/2020 09:04 AM   Modules accepted: Orders

## 2020-04-01 ENCOUNTER — Encounter: Payer: Self-pay | Admitting: Gastroenterology

## 2020-04-16 ENCOUNTER — Ambulatory Visit: Payer: Medicare HMO

## 2020-04-29 NOTE — Progress Notes (Signed)
Subjective:   Alexis Beard is a 74 y.o. female who presents for an Initial Medicare Annual Wellness Visit.  Review of Systems     Cardiac Risk Factors include: advanced age (>37men, >77 women);hypertension;dyslipidemia;obesity (BMI >30kg/m2)     Objective:    Today's Vitals   04/30/20 1451  BP: 134/84  Pulse: 65  Resp: 16  Temp: 98.1 F (36.7 C)  TempSrc: Oral  SpO2: 98%  Weight: 188 lb (85.3 kg)  Height: 5\' 5"  (1.651 m)   Body mass index is 31.28 kg/m.  Advanced Directives 04/30/2020  Does Patient Have a Medical Advance Directive? No  Would patient like information on creating a medical advance directive? Yes (MAU/Ambulatory/Procedural Areas - Information given)    Current Medications (verified) Outpatient Encounter Medications as of 04/30/2020  Medication Sig   alendronate (FOSAMAX) 70 MG tablet Take 1 tablet (70 mg total) by mouth every 7 (seven) days. Take with a full glass of water on an empty stomach.   amLODipine (NORVASC) 10 MG tablet Take 1 tablet (10 mg total) by mouth daily.   hydrochlorothiazide (HYDRODIURIL) 25 MG tablet Take 1 tablet (25 mg total) by mouth daily.   levothyroxine (SYNTHROID) 100 MCG tablet Take 1 tablet (100 mcg total) by mouth daily before breakfast.   traZODone (DESYREL) 50 MG tablet Take 0.5-1 tablets (25-50 mg total) by mouth at bedtime as needed for sleep.   Vitamin D, Ergocalciferol, (DRISDOL) 1.25 MG (50000 UNIT) CAPS capsule Take 1 capsule (50,000 Units total) by mouth every 14 (fourteen) days.   No facility-administered encounter medications on file as of 04/30/2020.    Allergies (verified) Codeine   History: Past Medical History:  Diagnosis Date   Anemia    Arthritis    Basal cell carcinoma (BCC)    Basal cell Carcinoma   Cataract    Colon polyps    Diverticulosis    Hypertension    Hypothyroid    Migraines    Osteoporosis    Past Surgical History:  Procedure Laterality Date   BASAL CELL  CARCINOMA EXCISION  09/2015   Back   birth mark removal  1998   on (R) side of neck   CARPAL [place]   Family History  Problem Relation Age of Onset   Diabetes Mother    Thyroid disease Mother    Heart disease Mother 63   Hyperlipidemia Father    Heart disease Father 68   Hyperlipidemia Sister    Cancer Maternal Aunt        Breast Cancer   Thyroid disease Maternal Aunt    Melanoma Maternal Aunt    Social History   Socioeconomic History   Marital status: Married    Spouse name: Jeneen Rinks   Number of children: 3   Years of education: 12   Highest education level: Not on file  Occupational History   Occupation: Controller  Tobacco Use   Smoking status: Never Smoker   Smokeless tobacco: Never Used  Scientific laboratory technician Use: Never used  Substance and Sexual Activity   Alcohol use: No   Drug use: No   Sexual activity: Never    Partners: Male    Birth control/protection: Condom  Other Topics Concern   Not on file  Social History Narrative   Married to [place], has children.   High school graduate.   Drinks caffeine.   Wears her seatbelt, wears a bicycle helmet.   Wears glasses.  Smoke detector in the home, firearms locked in the home.   Feels safe in her relationships.   Right handed.   Social Determinants of Health   Financial Resource Strain: Low Risk    Difficulty of Paying Living Expenses: Not hard at all  Food Insecurity: No Food Insecurity   Worried About Charity fundraiser in the Last Year: Never true   [place] in the Last Year: Never true  Transportation Needs: No Transportation Needs   Lack of Transportation (Medical): No   Lack of Transportation (Non-Medical): No  Physical Activity: Inactive   Days of Exercise per Week: 0 days   Minutes of Exercise per Session: 0 min  Stress: No Stress Concern Present   Feeling of Stress : Not at all  Social Connections: Socially Integrated    Frequency of Communication with Friends and Family: More than three times a week   Frequency of Social Gatherings with Friends and Family: More than three times a week   Attends Religious Services: 1 to 4 times per year   Active Member of [employer] or Organizations: Yes   Attends Music therapist: More than 4 times per year   Marital Status: Married    Tobacco Counseling Counseling given: Not Answered   Clinical Intake:  Pre-visit preparation completed: Yes  Pain : No/denies pain     Nutritional Status: BMI > 30  Obese Nutritional Risks: None Diabetes: No  How often do you need to have someone help you when you read instructions, pamphlets, or other written materials from your doctor or pharmacy?: 1 - Never What is the last grade level you completed in school?: some college  Diabetic?No  Interpreter Needed?: No  Information entered by :: Caroleen Hamman LPN   Activities of Daily Living In your present state of health, do you have any difficulty performing the following activities: 04/30/2020  Hearing? N  Vision? N  Difficulty concentrating or making decisions? N  Walking or climbing stairs? N  Dressing or bathing? N  Doing errands, shopping? N  Preparing Food and eating ? N  Using the Toilet? N  In the past six months, have you accidently leaked urine? N  Do you have problems with loss of bowel control? N  Managing your Medications? N  Managing your Finances? N  Housekeeping or managing your Housekeeping? N  Some recent data might be hidden    Patient Care Team: Ma Hillock, DO as PCP - General (Family Medicine) Rolm Bookbinder, MD as Consulting Physician (Dermatology) Elayne Snare, MD as Consulting Physician (Endocrinology)  Indicate any recent Medical Services you may have received from other than [facility] providers in the past year (date may be approximate).     Assessment:   This is a routine wellness examination for [place].  Hearing/Vision  screen  Hearing Screening   125Hz  250Hz  500Hz  1000Hz  2000Hz  3000Hz  4000Hz  6000Hz  8000Hz   Right ear:           Left ear:           Comments: No issues   Vision Screening Comments: Wears glasses Last eye exam-09/2019-[facility]  Dietary issues and exercise activities discussed: Current Exercise Habits: The patient does not participate in regular exercise at present (pt states she stays busy doing housework), Exercise limited by: None identified  Goals     Patient Stated     Increase activity by walking more.      Depression Screen PHQ 2/9 Scores 04/30/2020 02/19/2020  12/27/2019 12/26/2018 04/12/2018 01/12/2017 12/20/2016  PHQ - 2 Score 0 0 0 0 0 0 0  PHQ- 9 Score - - - - 0 - -    Fall Risk Fall Risk  04/30/2020 02/19/2020 12/27/2019 06/26/2018 04/12/2018  Falls in the past year? 1 0 0 1 0  Number falls in past yr: 0 0 0 0 -  Injury with Fall? 0 0 - 1 -  Risk for fall due to : - - No Fall Risks Impaired vision;Medication side effect -  Follow up Falls prevention discussed Falls evaluation completed Falls evaluation completed Education provided -    FALL RISK PREVENTION PERTAINING TO THE HOME:  Any stairs in or around the home? Yes  If so, are there any without handrails? No  Home free of loose throw rugs in walkways, pet beds, electrical cords, etc? Yes  Adequate lighting in your home to reduce risk of falls? Yes   ASSISTIVE DEVICES UTILIZED TO PREVENT FALLS:  Life alert? No  Use of a cane, walker or w/c? No  Grab bars in the bathroom? No  Shower chair or bench in shower? No  Elevated toilet seat or a handicapped toilet? No   TIMED UP AND GO:  Was the test performed? Yes .  Length of time to ambulate 10 feet: 10 sec.   Gait steady and fast without use of assistive device  Cognitive Function:Normal cognitive status assessed by direct observation by this Nurse Health Advisor. No abnormalities found.          Immunizations Immunization History  Administered Date(s)  Administered   Influenza, High Dose Seasonal PF 04/11/2018   Pneumococcal Conjugate-13 08/25/2015   Pneumococcal Polysaccharide-23 02/19/2020   Tdap 08/27/2012    TDAP status: Up to date  Flu Vaccine status: Declined, Education has been provided regarding the importance of this vaccine but patient still declined. Advised may receive this vaccine at local pharmacy or Health Dept. Aware to provide a copy of the vaccination record if obtained from local pharmacy or Health Dept. Verbalized acceptance and understanding.  Pneumococcal vaccine status: Up to date  Covid-19 vaccine status: Declined, Education has been provided regarding the importance of this vaccine but patient still declined. Advised may receive this vaccine at local pharmacy or Health Dept.or vaccine clinic. Aware to provide a copy of the vaccination record if obtained from local pharmacy or Health Dept. Verbalized acceptance and understanding.  Qualifies for Shingles Vaccine? Yes   Zostavax completed No   Shingrix Completed?: No.    Education has been provided regarding the importance of this vaccine. Patient has been advised to call insurance company to determine out of pocket expense if they have not yet received this vaccine. Advised may also receive vaccine at local pharmacy or Health Dept. Verbalized acceptance and understanding.  Screening Tests Health Maintenance  Topic Date Due   COLONOSCOPY  Never done   INFLUENZA VACCINE  08/14/2020 (Originally 12/16/2019)   MAMMOGRAM  01/18/2021   DEXA SCAN  01/18/2021   TETANUS/TDAP  08/28/2022   Hepatitis C Screening  Completed   PNA vac Low Risk Adult  Completed   COVID-19 Vaccine  Discontinued    Health Maintenance  Health Maintenance Due  Topic Date Due   COLONOSCOPY  Never done    Colorectal cancer screening: Patient has an appt on 06/05/2019  Mammogram status: Due-Patient declined today. She will call the office when she is ready to schedule.  Bone  Density status: Completed 01/19/2019. Results reflect: Bone density results:  OSTEOPOROSIS. Repeat every 2 years.  Lung Cancer Screening: (Low Dose CT Chest recommended if Age 37-80 years, 30 pack-year currently smoking OR have quit w/in 15years.) does not qualify.     Additional Screening:  Hepatitis C Screening: Completed 12/26/2018  Vision Screening: Recommended annual ophthalmology exams for early detection of glaucoma and other disorders of the eye. Is the patient up to date with their annual eye exam?  Yes  Who is the provider or what is the name of the office in which the patient attends annual eye exams? [facility]   Dental Screening: Recommended annual dental exams for proper oral hygiene  Community Resource Referral / Chronic Care Management: CRR required this visit?  No   CCM required this visit?  No      Plan:     I have personally reviewed and noted the following in the patients chart:    Medical and social history  Use of alcohol, tobacco or illicit drugs   Current medications and supplements  Functional ability and status  Nutritional status  Physical activity  Advanced directives  List of other physicians  Hospitalizations, surgeries, and ER visits in previous 12 months  Vitals  Screenings to include cognitive, depression, and falls  Referrals and appointments  In addition, I have reviewed and discussed with patient certain preventive protocols, quality metrics, and best practice recommendations. A written personalized care plan for preventive services as well as general preventive health recommendations were provided to patient.     Marta Antu, LPN   [id number]  Nurse Health Advisor  Nurse Notes: None

## 2020-04-30 ENCOUNTER — Ambulatory Visit (INDEPENDENT_AMBULATORY_CARE_PROVIDER_SITE_OTHER): Payer: Medicare HMO

## 2020-04-30 ENCOUNTER — Other Ambulatory Visit: Payer: Self-pay

## 2020-04-30 VITALS — BP 134/84 | HR 65 | Temp 98.1°F | Resp 16 | Ht 65.0 in | Wt 188.0 lb

## 2020-04-30 DIAGNOSIS — Z0000 Encounter for general adult medical examination without abnormal findings: Secondary | ICD-10-CM

## 2020-04-30 NOTE — Patient Instructions (Signed)
Alexis Beard , Thank you for taking time to come for your Medicare Wellness Visit. I appreciate your ongoing commitment to your health goals. Please review the following plan we discussed and let me know if I can assist you in the future.   Screening recommendations/referrals: Colonoscopy: Scheduled for 06/04/2020 Mammogram: Declined today. Please call the office when you are ready to schedule. Bone Density: Completed 01/19/2019-Due 01/18/2021 Recommended yearly ophthalmology/optometry visit for glaucoma screening and checkup Recommended yearly dental visit for hygiene and checkup  Vaccinations: Influenza vaccine: Declined Pneumococcal vaccine: Completed vaccines Tdap vaccine: Up to date-Due-08/28/2022 Shingles vaccine: Discuss with pharmacy Covid-19:Declined  Advanced directives: Information given today  Conditions/risks identified: See problem list  Next appointment: Follow up in one year for your annual wellness visit 05/06/21 @ 3:00pm   Preventive Care 65 Years and Older, Female Preventive care refers to lifestyle choices and visits with your health care provider that can promote health and wellness. What does preventive care include?  A yearly physical exam. This is also called an annual well check.  Dental exams once or twice a year.  Routine eye exams. Ask your health care provider how often you should have your eyes checked.  Personal lifestyle choices, including:  Daily care of your teeth and gums.  Regular physical activity.  Eating a healthy diet.  Avoiding tobacco and drug use.  Limiting alcohol use.  Practicing safe sex.  Taking low-dose aspirin every day.  Taking vitamin and mineral supplements as recommended by your health care provider. What happens during an annual well check? The services and screenings done by your health care provider during your annual well check will depend on your age, overall health, lifestyle risk factors, and family history of  disease. Counseling  Your health care provider may ask you questions about your:  Alcohol use.  Tobacco use.  Drug use.  Emotional well-being.  Home and relationship well-being.  Sexual activity.  Eating habits.  History of falls.  Memory and ability to understand (cognition).  Work and work Statistician.  Reproductive health. Screening  You may have the following tests or measurements:  Height, weight, and BMI.  Blood pressure.  Lipid and cholesterol levels. These may be checked every 5 years, or more frequently if you are over 28 years old.  Skin check.  Lung cancer screening. You may have this screening every year starting at age 74 if you have a 30-pack-year history of smoking and currently smoke or have quit within the past 15 years.  Fecal occult blood test (FOBT) of the stool. You may have this test every year starting at age 74.  Flexible sigmoidoscopy or colonoscopy. You may have a sigmoidoscopy every 5 years or a colonoscopy every 10 years starting at age 74.  Hepatitis C blood test.  Hepatitis B blood test.  Sexually transmitted disease (STD) testing.  Diabetes screening. This is done by checking your blood sugar (glucose) after you have not eaten for a while (fasting). You may have this done every 1-3 years.  Bone density scan. This is done to screen for osteoporosis. You may have this done starting at age 74.  Mammogram. This may be done every 1-2 years. Talk to your health care provider about how often you should have regular mammograms. Talk with your health care provider about your test results, treatment options, and if necessary, the need for more tests. Vaccines  Your health care provider may recommend certain vaccines, such as:  Influenza vaccine. This is recommended every year.  Tetanus, diphtheria, and acellular pertussis (Tdap, Td) vaccine. You may need a Td booster every 10 years.  Zoster vaccine. You may need this after age  4.  Pneumococcal 13-valent conjugate (PCV13) vaccine. One dose is recommended after age 74.  Pneumococcal polysaccharide (PPSV23) vaccine. One dose is recommended after age 74. Talk to your health care provider about which screenings and vaccines you need and how often you need them. This information is not intended to replace advice given to you by your health care provider. Make sure you discuss any questions you have with your health care provider. Document Released: 05/30/2015 Document Revised: 01/21/2016 Document Reviewed: 03/04/2015 Elsevier Interactive Patient Education  2017 [employer] Prevention in the Home Falls can cause injuries. They can happen to people of all ages. There are many things you can do to make your home safe and to help prevent falls. What can I do on the outside of my home?  Regularly fix the edges of walkways and driveways and fix any cracks.  Remove anything that might make you trip as you walk through a door, such as a raised step or threshold.  Trim any bushes or trees on the path to your home.  Use bright outdoor lighting.  Clear any walking paths of anything that might make someone trip, such as rocks or tools.  Regularly check to see if handrails are loose or broken. Make sure that both sides of any steps have handrails.  Any raised decks and porches should have guardrails on the edges.  Have any leaves, snow, or ice cleared regularly.  Use sand or salt on walking paths during winter.  Clean up any spills in your garage right away. This includes oil or grease spills. What can I do in the bathroom?  Use night lights.  Install grab bars by the toilet and in the tub and shower. Do not use towel bars as grab bars.  Use non-skid mats or decals in the tub or shower.  If you need to sit down in the shower, use a plastic, non-slip stool.  Keep the floor dry. Clean up any water that spills on the floor as soon as it happens.  Remove  soap buildup in the tub or shower regularly.  Attach bath mats securely with double-sided non-slip rug tape.  Do not have throw rugs and other things on the floor that can make you trip. What can I do in the bedroom?  Use night lights.  Make sure that you have a light by your bed that is easy to reach.  Do not use any sheets or blankets that are too big for your bed. They should not hang down onto the floor.  Have a firm chair that has side arms. You can use this for support while you get dressed.  Do not have throw rugs and other things on the floor that can make you trip. What can I do in the kitchen?  Clean up any spills right away.  Avoid walking on wet floors.  Keep items that you use a lot in easy-to-reach places.  If you need to reach something above you, use a strong step stool that has a grab bar.  Keep electrical cords out of the way.  Do not use floor polish or wax that makes floors slippery. If you must use wax, use non-skid floor wax.  Do not have throw rugs and other things on the floor that can make you trip. What can I do  with my stairs?  Do not leave any items on the stairs.  Make sure that there are handrails on both sides of the stairs and use them. Fix handrails that are broken or loose. Make sure that handrails are as long as the stairways.  Check any carpeting to make sure that it is firmly attached to the stairs. Fix any carpet that is loose or worn.  Avoid having throw rugs at the top or bottom of the stairs. If you do have throw rugs, attach them to the floor with carpet tape.  Make sure that you have a light switch at the top of the stairs and the bottom of the stairs. If you do not have them, ask someone to add them for you. What else can I do to help prevent falls?  Wear shoes that:  Do not have high heels.  Have rubber bottoms.  Are comfortable and fit you well.  Are closed at the toe. Do not wear sandals.  If you use a  stepladder:  Make sure that it is fully opened. Do not climb a closed stepladder.  Make sure that both sides of the stepladder are locked into place.  Ask someone to hold it for you, if possible.  Clearly mark and make sure that you can see:  Any grab bars or handrails.  First and last steps.  Where the edge of each step is.  Use tools that help you move around (mobility aids) if they are needed. These include:  Canes.  Walkers.  Scooters.  Crutches.  Turn on the lights when you go into a dark area. Replace any light bulbs as soon as they burn out.  Set up your furniture so you have a clear path. Avoid moving your furniture around.  If any of your floors are uneven, fix them.  If there are any pets around you, be aware of where they are.  Review your medicines with your doctor. Some medicines can make you feel dizzy. This can increase your chance of falling. Ask your doctor what other things that you can do to help prevent falls. This information is not intended to replace advice given to you by your health care provider. Make sure you discuss any questions you have with your health care provider. Document Released: 02/27/2009 Document Revised: 10/09/2015 Document Reviewed: 06/07/2014 Elsevier Interactive Patient Education  2017 [employer].

## 2020-05-07 DIAGNOSIS — L814 Other melanin hyperpigmentation: Secondary | ICD-10-CM | POA: Diagnosis not present

## 2020-05-07 DIAGNOSIS — L821 Other seborrheic keratosis: Secondary | ICD-10-CM | POA: Diagnosis not present

## 2020-05-07 DIAGNOSIS — D225 Melanocytic nevi of trunk: Secondary | ICD-10-CM | POA: Diagnosis not present

## 2020-05-07 DIAGNOSIS — L570 Actinic keratosis: Secondary | ICD-10-CM | POA: Diagnosis not present

## 2020-05-07 DIAGNOSIS — L84 Corns and callosities: Secondary | ICD-10-CM | POA: Diagnosis not present

## 2020-05-21 ENCOUNTER — Ambulatory Visit (AMBULATORY_SURGERY_CENTER): Payer: Self-pay | Admitting: *Deleted

## 2020-05-21 ENCOUNTER — Other Ambulatory Visit: Payer: Self-pay

## 2020-05-21 VITALS — Ht 63.0 in | Wt 189.4 lb

## 2020-05-21 DIAGNOSIS — Z86010 Personal history of colonic polyps: Secondary | ICD-10-CM

## 2020-05-21 DIAGNOSIS — Z01818 Encounter for other preprocedural examination: Secondary | ICD-10-CM

## 2020-05-21 NOTE — Progress Notes (Signed)
covid test 06-02-20 at 10:00 am  Pt is aware that care partner will wait in the car during procedure; if they feel like they will be too hot or cold to wait in the car; they may wait in the 4 th floor lobby. Patient is aware to bring only one care partner. We want them to wear a mask (we do not have any that we can provide them), practice social distancing, and we will check their temperatures when they get here.  I did remind the patient that their care partner needs to stay in the parking lot the entire time and have a cell phone available, we will call them when the pt is ready for discharge. Patient will wear mask into building.  Pr denies issues with anesthesia, trouble moving neck or fam hx/hx of malignant hyperthermia   No egg or soy allergy  No home oxygen use   No medications for weight loss taken  Pt does have "some" constipation issues.  2 day Miralax/Miralax prep given

## 2020-05-28 ENCOUNTER — Encounter: Payer: Self-pay | Admitting: Gastroenterology

## 2020-05-29 ENCOUNTER — Telehealth: Payer: Self-pay | Admitting: Gastroenterology

## 2020-05-29 NOTE — Telephone Encounter (Signed)
Pt is requesting to reschedule her covid test that is scheduled for Monday 1/17.

## 2020-05-29 NOTE — Telephone Encounter (Signed)
Spoke to [place] at [place]-  Pt ok to come Friday 1-14 - pt states  she will go at 10-11 Friday

## 2020-05-30 ENCOUNTER — Other Ambulatory Visit: Payer: Self-pay | Admitting: Gastroenterology

## 2020-05-30 DIAGNOSIS — Z1159 Encounter for screening for other viral diseases: Secondary | ICD-10-CM | POA: Diagnosis not present

## 2020-05-30 LAB — SARS CORONAVIRUS 2 (TAT 6-24 HRS): SARS Coronavirus 2: NEGATIVE

## 2020-06-04 ENCOUNTER — Encounter: Payer: Medicare HMO | Admitting: Gastroenterology

## 2020-08-04 ENCOUNTER — Other Ambulatory Visit: Payer: Self-pay

## 2020-08-05 ENCOUNTER — Ambulatory Visit (INDEPENDENT_AMBULATORY_CARE_PROVIDER_SITE_OTHER): Payer: Medicare HMO | Admitting: Family Medicine

## 2020-08-05 ENCOUNTER — Encounter: Payer: Self-pay | Admitting: Family Medicine

## 2020-08-05 VITALS — BP 122/72 | HR 72 | Temp 98.2°F | Ht 63.0 in | Wt 186.0 lb

## 2020-08-05 DIAGNOSIS — I10 Essential (primary) hypertension: Secondary | ICD-10-CM

## 2020-08-05 DIAGNOSIS — G479 Sleep disorder, unspecified: Secondary | ICD-10-CM | POA: Diagnosis not present

## 2020-08-05 DIAGNOSIS — E785 Hyperlipidemia, unspecified: Secondary | ICD-10-CM

## 2020-08-05 DIAGNOSIS — E782 Mixed hyperlipidemia: Secondary | ICD-10-CM | POA: Diagnosis not present

## 2020-08-05 DIAGNOSIS — E669 Obesity, unspecified: Secondary | ICD-10-CM

## 2020-08-05 DIAGNOSIS — E039 Hypothyroidism, unspecified: Secondary | ICD-10-CM

## 2020-08-05 DIAGNOSIS — R7309 Other abnormal glucose: Secondary | ICD-10-CM | POA: Diagnosis not present

## 2020-08-05 DIAGNOSIS — M810 Age-related osteoporosis without current pathological fracture: Secondary | ICD-10-CM | POA: Diagnosis not present

## 2020-08-05 DIAGNOSIS — E559 Vitamin D deficiency, unspecified: Secondary | ICD-10-CM | POA: Diagnosis not present

## 2020-08-05 MED ORDER — AMLODIPINE BESYLATE 10 MG PO TABS: 10.0000 mg | ORAL_TABLET | Freq: Every day | ORAL | 1 refills | Status: DC

## 2020-08-05 MED ORDER — ALENDRONATE SODIUM 70 MG PO TABS
70.0000 mg | ORAL_TABLET | ORAL | 11 refills | Status: DC
Start: 2020-08-05 — End: 2021-01-27

## 2020-08-05 MED ORDER — HYDROCHLOROTHIAZIDE 25 MG PO TABS: 25.0000 mg | ORAL_TABLET | Freq: Every day | ORAL | 1 refills | Status: DC

## 2020-08-05 MED ORDER — VITAMIN D (ERGOCALCIFEROL) 1.25 MG (50000 UNIT) PO CAPS: 50000.0000 [IU] | ORAL_CAPSULE | ORAL | 4 refills | Status: DC

## 2020-08-05 MED ORDER — TRAZODONE HCL 50 MG PO TABS: 25.0000 mg | ORAL_TABLET | Freq: Every evening | ORAL | 5 refills | Status: DC | PRN

## 2020-08-05 MED ORDER — LEVOTHYROXINE SODIUM 100 MCG PO TABS: 100.0000 ug | ORAL_TABLET | Freq: Every day | ORAL | 3 refills | Status: DC

## 2020-08-05 NOTE — Patient Instructions (Signed)
Great to see you today.  Next appt in early September for your physical. We will collect fasting labs that appt.    Your BP looks great!

## 2020-08-05 NOTE — Progress Notes (Signed)
Patient ID: DEL WISEMAN, female  DOB: 1946/03/23, 75 y.o.   MRN: [id number] Patient Care Team    Relationship Specialty Notifications Start End  Ma Hillock, DO PCP - General Family Medicine  12/20/16   Rolm Bookbinder, MD Consulting Physician Dermatology  12/22/16   Elayne Snare, MD Consulting Physician Endocrinology  12/22/16     Chief Complaint  Patient presents with  . Follow-up    Pt is fasting     Subjective: Alexis Beard is a 75 y.o.  Female  present for [facility] with acute concern Hypertension/morbid obesity: Pt reports compliance with amlodipine 10 mg daily and HCTZ 25 mg a day.  Patient denies chest pain, shortness of breath, dizziness or lower extremity edema.  Pt does not take a daily baby ASA. Pt is not  prescribed statin. Diet: Low-sodium diet  Exercise: Not routinely RF: Hypertension, family history present, obesity  Acquired hypothyroidism tsh normal 02/2020.Patient reports compliancewith Synthroid (name brand) 100 mcg daily on an empty stomach.  Sleep disturbance: Patient reports compliance with  trazodone and sleep pattern is much improved.  She typically takes a half a tab/25 mg nightly but occasionally will take a whole tablet 50 mg nightly.  Osteoporosis: Patient reports compliance with Fosamax 70 mg q. 7 days.  She is compliant with high-dose 50,000 units ergocalciferol every 14 days. Vit D 43 (02/2020)  Depression screen [facility] 2/9 04/30/2020 02/19/2020 12/27/2019 12/26/2018 04/12/2018  Decreased Interest 0 0 0 0 0  Down, Depressed, Hopeless 0 0 0 0 0  PHQ - 2 Score 0 0 0 0 0  Altered sleeping - - - - 0  Tired, decreased energy - - - - 0  Change in appetite - - - - 0  Feeling bad or failure about yourself  - - - - 0  Trouble concentrating - - - - 0  Moving slowly or fidgety/restless - - - - 0  Suicidal thoughts - - - - 0  PHQ-9 Score - - - - 0  Difficult doing work/chores - - - - Not difficult at all   No flowsheet data found.   Immunization History   Administered Date(s) Administered  . Influenza, High Dose Seasonal PF 04/11/2018  . Pneumococcal Conjugate-13 08/25/2015  . Pneumococcal Polysaccharide-23 02/19/2020  . Tdap 08/27/2012    Past Medical History:  Diagnosis Date  . Anemia   . Arthritis   . Basal cell carcinoma (BCC)    Basal cell Carcinoma skin CA, on foot  . Cataract   . Colon polyps   . Diverticulosis   . GERD (gastroesophageal reflux disease)    OCC  . Hypertension   . Hypothyroid   . Migraines   . Osteoporosis    Allergies  Allergen Reactions  . Codeine Nausea And Vomiting   Past Surgical History:  Procedure Laterality Date  . BASAL CELL CARCINOMA EXCISION  09/2015   Back  . birth mark removal  1998   on (R) side of neck  . CARPAL TUNNEL RELEASE  1998  . COLONOSCOPY    . TONSILLECTOMY  1956   Family History  Problem Relation Age of Onset  . Diabetes Mother   . Thyroid disease Mother   . Heart disease Mother 34  . Hyperlipidemia Father   . Heart disease Father 71  . Hyperlipidemia Sister   . Cancer Maternal Aunt        Breast Cancer  . Thyroid disease Maternal Aunt   . Melanoma  Maternal Aunt   . Colon cancer Neg Hx   . Esophageal cancer Neg Hx   . Rectal cancer Neg Hx   . Stomach cancer Neg Hx    Social History   Social History Narrative   Married to [place], has children.   High school graduate.   Drinks caffeine.   Wears her seatbelt, wears a bicycle helmet.   Wears glasses.   Smoke detector in the home, firearms locked in the home.   Feels safe in her relationships.   Right handed.    Allergies as of 08/05/2020      Reactions   Codeine Nausea And Vomiting      Medication List       Accurate as of August 05, 2020 10:47 AM. If you have any questions, ask your nurse or doctor.        alendronate 70 MG tablet Commonly known as: FOSAMAX Take 1 tablet (70 mg total) by mouth every 7 (seven) days. Take with a full glass of water on an empty stomach.   amLODipine 10 MG  tablet Commonly known as: NORVASC Take 1 tablet (10 mg total) by mouth daily.   hydrochlorothiazide 25 MG tablet Commonly known as: HYDRODIURIL Take 1 tablet (25 mg total) by mouth daily.   levothyroxine 100 MCG tablet Commonly known as: Synthroid Take 1 tablet (100 mcg total) by mouth daily before breakfast.   MIRALAX PO Take by mouth. Takes weekly   NAPROXEN PO Take 500 mg by mouth daily as needed.   traZODone 50 MG tablet Commonly known as: DESYREL Take 0.5-1 tablets (25-50 mg total) by mouth at bedtime as needed for sleep.   Vitamin D (Ergocalciferol) 1.25 MG (50000 UNIT) Caps capsule Commonly known as: DRISDOL Take 1 capsule (50,000 Units total) by mouth every 14 (fourteen) days.       All past medical history, surgical history, allergies, family history, immunizations andmedications were updated in the EMR today and reviewed under the history and medication portions of their EMR.      No results found.   ROS: 14 pt review of systems performed and negative (unless mentioned in an HPI)  Objective: BP 122/72   Pulse 72   Temp 98.2 F (36.8 C) (Oral)   Ht 5\' 3"  (1.6 m)   Wt 186 lb (84.4 kg)   SpO2 97%   BMI 32.95 kg/m  Gen: Afebrile. No acute distress. Obese. Very pleasant female.  HENT: AT. [place].  Eyes:Pupils Equal Round Reactive to light, Extraocular movements intact,  Conjunctiva without redness, discharge or icterus. Neck/lymp/endocrine: Supple,no lymphadenopathy, no thyromegaly CV: RRR no murmur, no edema, +2/4 P posterior tibialis pulses Chest: CTAB, no wheeze or crackles Neuro: Normal gait. PERLA. EOMi. Alert. Oriented x3 Psych: Normal affect, dress and demeanor. Normal speech. Normal thought content and judgment.    No exam data present  Assessment/plan: Alexis Beard is a 75 y.o. female present for [facility] Essential hypertension/HLD/morbid obesity Stable.  - Low-sodium, exercise.  - continue  amlodipine 10 mg daily. - continue  HCTZ 25 mg daily.     Acquired hypothyroidism Stable.  Continue  Synthroid 100 mcg daily,- labs UTD 02/2020  Insomnia: Stable.  Continue  trazodone 25-50 mg nightly  Return in about 6 months (around 01/22/2021) for CPE (30 min), [facility] (30 min).  No orders of the defined types were placed in this encounter.   Meds ordered this encounter  Medications  . Vitamin D, Ergocalciferol, (DRISDOL) 1.25 MG (50000 UNIT) CAPS capsule  Sig: Take 1 capsule (50,000 Units total) by mouth every 14 (fourteen) days.    Dispense:  6 capsule    Refill:  4  . traZODone (DESYREL) 50 MG tablet    Sig: Take 0.5-1 tablets (25-50 mg total) by mouth at bedtime as needed for sleep.    Dispense:  30 tablet    Refill:  5  . hydrochlorothiazide (HYDRODIURIL) 25 MG tablet    Sig: Take 1 tablet (25 mg total) by mouth daily.    Dispense:  90 tablet    Refill:  1  . amLODipine (NORVASC) 10 MG tablet    Sig: Take 1 tablet (10 mg total) by mouth daily.    Dispense:  90 tablet    Refill:  1  . levothyroxine (SYNTHROID) 100 MCG tablet    Sig: Take 1 tablet (100 mcg total) by mouth daily before breakfast.    Dispense:  90 tablet    Refill:  3    Name brand.  Marland Kitchen alendronate (FOSAMAX) 70 MG tablet    Sig: Take 1 tablet (70 mg total) by mouth every 7 (seven) days. Take with a full glass of water on an empty stomach.    Dispense:  4 tablet    Refill:  11    Referral Orders  No referral(s) requested today    Electronically signed by: Howard Pouch, [place]

## 2020-10-09 DIAGNOSIS — Z0100 Encounter for examination of eyes and vision without abnormal findings: Secondary | ICD-10-CM | POA: Diagnosis not present

## 2021-01-18 ENCOUNTER — Other Ambulatory Visit: Payer: Self-pay

## 2021-01-18 ENCOUNTER — Emergency Department (HOSPITAL_COMMUNITY)
Admission: EM | Admit: 2021-01-18 | Discharge: 2021-01-18 | Disposition: A | Discharge status: Home / Self Care | Payer: Medicare HMO | Attending: Emergency Medicine | Admitting: Emergency Medicine

## 2021-01-18 ENCOUNTER — Emergency Department (HOSPITAL_COMMUNITY): Payer: Medicare HMO

## 2021-01-18 DIAGNOSIS — U071 COVID-19: Secondary | ICD-10-CM | POA: Diagnosis not present

## 2021-01-18 DIAGNOSIS — S069X9A Unspecified intracranial injury with loss of consciousness of unspecified duration, initial encounter: Secondary | ICD-10-CM | POA: Diagnosis not present

## 2021-01-18 DIAGNOSIS — R0902 Hypoxemia: Secondary | ICD-10-CM | POA: Diagnosis not present

## 2021-01-18 DIAGNOSIS — Z85828 Personal history of other malignant neoplasm of skin: Secondary | ICD-10-CM | POA: Insufficient documentation

## 2021-01-18 DIAGNOSIS — E039 Hypothyroidism, unspecified: Secondary | ICD-10-CM | POA: Diagnosis not present

## 2021-01-18 DIAGNOSIS — I10 Essential (primary) hypertension: Secondary | ICD-10-CM | POA: Diagnosis not present

## 2021-01-18 DIAGNOSIS — I959 Hypotension, unspecified: Secondary | ICD-10-CM | POA: Diagnosis not present

## 2021-01-18 DIAGNOSIS — Z79899 Other long term (current) drug therapy: Secondary | ICD-10-CM | POA: Diagnosis not present

## 2021-01-18 DIAGNOSIS — R6883 Chills (without fever): Secondary | ICD-10-CM | POA: Diagnosis not present

## 2021-01-18 DIAGNOSIS — Z743 Need for continuous supervision: Secondary | ICD-10-CM | POA: Diagnosis not present

## 2021-01-18 DIAGNOSIS — R531 Weakness: Secondary | ICD-10-CM | POA: Diagnosis not present

## 2021-01-18 DIAGNOSIS — R55 Syncope and collapse: Secondary | ICD-10-CM | POA: Diagnosis not present

## 2021-01-18 LAB — HEPATIC FUNCTION PANEL
ALT: 13 U/L (ref 0–44)
AST: 18 U/L (ref 15–41)
Albumin: 3.7 g/dL (ref 3.5–5.0)
Alkaline Phosphatase: 53 U/L (ref 38–126)
Bilirubin, Direct: 0.1 mg/dL (ref 0.0–0.2)
Indirect Bilirubin: 0.3 mg/dL (ref 0.3–0.9)
Total Bilirubin: 0.4 mg/dL (ref 0.3–1.2)
Total Protein: 6.7 g/dL (ref 6.5–8.1)

## 2021-01-18 LAB — URINALYSIS, ROUTINE W REFLEX MICROSCOPIC
Bilirubin Urine: NEGATIVE
Glucose, UA: NEGATIVE mg/dL
Ketones, ur: NEGATIVE mg/dL
Leukocytes,Ua: NEGATIVE
Nitrite: NEGATIVE
Protein, ur: NEGATIVE mg/dL
Specific Gravity, Urine: >1.03 — ABNORMAL HIGH (ref 1.005–1.030)
pH: 5.5 (ref 5.0–8.0)

## 2021-01-18 LAB — BASIC METABOLIC PANEL
Anion gap: 8 (ref 5–15)
BUN: 9 mg/dL (ref 8–23)
CO2: 26 mmol/L (ref 22–32)
Calcium: 8.5 mg/dL — ABNORMAL LOW (ref 8.9–10.3)
Chloride: 102 mmol/L (ref 98–111)
Creatinine, Ser: 0.71 mg/dL (ref 0.44–1.00)
GFR, Estimated: >60 mL/min (ref >60)
Glucose, Bld: 120 mg/dL — ABNORMAL HIGH (ref 70–99)
Potassium: 4 mmol/L (ref 3.5–5.1)
Sodium: 136 mmol/L (ref 135–145)

## 2021-01-18 LAB — URINALYSIS, MICROSCOPIC (REFLEX)

## 2021-01-18 LAB — RESP PANEL BY RT-PCR (FLU A&B, COVID) ARPGX2
Influenza A by PCR: NEGATIVE
Influenza B by PCR: NEGATIVE
SARS Coronavirus 2 by RT PCR: POSITIVE — AB

## 2021-01-18 LAB — CBG MONITORING, ED: Glucose-Capillary: 117 mg/dL — ABNORMAL HIGH (ref 70–99)

## 2021-01-18 LAB — CBC
HCT: 40.2 % (ref 36.0–46.0)
Hemoglobin: 13.1 g/dL (ref 12.0–15.0)
MCH: 29.8 pg (ref 26.0–34.0)
MCHC: 32.6 g/dL (ref 30.0–36.0)
MCV: 91.6 fL (ref 80.0–100.0)
Platelets: 207 10*3/uL (ref 150–400)
RBC: 4.39 MIL/uL (ref 3.87–5.11)
RDW: 13.4 % (ref 11.5–15.5)
WBC: 4.9 10*3/uL (ref 4.0–10.5)
nRBC: 0 % (ref 0.0–0.2)

## 2021-01-18 LAB — LIPASE, BLOOD: Lipase: 30 U/L (ref 11–51)

## 2021-01-18 LAB — TROPONIN I (HIGH SENSITIVITY)
Troponin I (High Sensitivity): 8 ng/L (ref <18)
Troponin I (High Sensitivity): 8 ng/L (ref <18)

## 2021-01-18 MED ORDER — MOLNUPIRAVIR EUA 200MG CAPSULE: 4.0000 | ORAL_CAPSULE | Freq: Two times a day (BID) | ORAL | 0 refills | Status: AC

## 2021-01-18 MED ORDER — SODIUM CHLORIDE 0.9 % IV BOLUS
1000.0000 mL | Freq: Once | INTRAVENOUS | Status: AC
  Administered 2021-01-18: 1000 mL via INTRAVENOUS

## 2021-01-18 NOTE — ED Triage Notes (Signed)
Pt BIB [facility] from home c/o chills, body aches, and decreased appetite since Friday. Today pt states having a LOC, for unknowns time. Woke in covered in urine on her coach. No injuries from episode. Hx "blackouts" when she had COVID in 2019. Has had +sick exposure from grandson, unknown dx. No CP/SOB. No seizure HX. A&Ox4 in triage.

## 2021-01-18 NOTE — Discharge Instructions (Signed)
Your COVID test is positive.  Keep yourself quarantined at home for a total of 5 days.  Use Tylenol Motrin as needed for aches and fever.  Follow-up with your doctor.  Return to the ED with chest pain, shortness of breath, unable to eat or drink or any other concerns.

## 2021-01-18 NOTE — ED Provider Notes (Signed)
[facility] EMERGENCY DEPARTMENT Provider Note   CSN: [id number] Arrival date & time: 01/18/21  0333     History Chief Complaint  Patient presents with   Loss of Consciousness    Alexis Beard is a 75 y.o. female.  Patient with a history of hypertension, hypothyroidism, skin cancer presenting with a 2-day history of chills, body aches, decreased appetite.  States she has not been eating or drinking very well and being wrapped up in a blanket at home for the past 2 days having shaking chills.  Has not checked her temperature.  Today while walking to the couch she had a syncopal episode preceded by dizziness and lightheadedness and diaphoresis.  States she fell striking her head on a soft surface and lost consciousness briefly.  When she woke up she was covered in urine.  She states she was feeling dizzy and lightheaded prior to the fall and had nausea, got hot and sweaty and blurry vision.  Has had sick contacts with her grandson.  She is not COVID vaccinated. Denies any chest pain.  Denies any shortness of breath.  Denies any history of seizures.  No tongue biting. Complains of feeling sore and achy all over.  Does not think she hit her head when she lost consciousness. Not had any vomiting or diarrhea but has had no p.o. intake for the past several days  The history is provided by the patient and the EMS personnel.  Loss of Consciousness Associated symptoms: dizziness, headaches and weakness   Associated symptoms: no chest pain, no fever, no nausea, no shortness of breath and no vomiting       Past Medical History:  Diagnosis Date   Anemia    Arthritis    Basal cell carcinoma (BCC)    Basal cell Carcinoma skin CA, on foot   Cataract    Colon polyps    Diverticulosis    GERD (gastroesophageal reflux disease)    OCC   Hypertension    Hypothyroid    Migraines    Osteoporosis     Patient Active Problem List   Diagnosis Date Noted   Osteoporosis without current pathological  fracture 12/26/2018   Elevated hemoglobin A1c 12/26/2018   Obesity (BMI 30-39.9) 06/26/2018   Essential hypertension 05/08/2013   Hyperlipidemia 05/08/2013   Vitamin D deficiency 02/08/2013   Hypothyroidism 02/07/2013    Past Surgical History:  Procedure Laterality Date   BASAL CELL CARCINOMA EXCISION  09/2015   Back   birth mark removal  1998   on (R) side of neck   CARPAL [place]     OB History     Gravida  1   Para  1   Term  1   Preterm      AB      Living  1      SAB      IAB      Ectopic      Multiple      Live Births              Family History  Problem Relation Age of Onset   Diabetes Mother    Thyroid disease Mother    Heart disease Mother 81   Hyperlipidemia Father    Heart disease Father 11   Hyperlipidemia Sister    Cancer Maternal Aunt        Breast Cancer   Thyroid disease Maternal  Aunt    Melanoma Maternal Aunt    Colon cancer Neg Hx    Esophageal cancer Neg Hx    Rectal cancer Neg Hx    Stomach cancer Neg Hx     Social History   Tobacco Use   Smoking status: Never   Smokeless tobacco: Never  Vaping Use   Vaping Use: Never used  Substance Use Topics   Alcohol use: No   Drug use: No    Home Medications Prior to Admission medications   Medication Sig Start Date End Date Taking? Authorizing Provider  alendronate (FOSAMAX) 70 MG tablet Take 1 tablet (70 mg total) by mouth every 7 (seven) days. Take with a full glass of water on an empty stomach. 08/05/20   Kuneff, Renee A, DO  amLODipine (NORVASC) 10 MG tablet Take 1 tablet (10 mg total) by mouth daily. 08/05/20   Kuneff, Renee A, DO  hydrochlorothiazide (HYDRODIURIL) 25 MG tablet Take 1 tablet (25 mg total) by mouth daily. 08/05/20   Kuneff, Renee A, DO  levothyroxine (SYNTHROID) 100 MCG tablet Take 1 tablet (100 mcg total) by mouth daily before breakfast. 08/05/20   Kuneff, Renee A, DO  NAPROXEN PO Take 500 mg by mouth  daily as needed.    [provider]  Polyethylene Glycol 3350 (MIRALAX PO) Take by mouth. Takes weekly    [provider]  traZODone (DESYREL) 50 MG tablet Take 0.5-1 tablets (25-50 mg total) by mouth at bedtime as needed for sleep. 08/05/20   Kuneff, Renee A, DO  Vitamin D, Ergocalciferol, (DRISDOL) 1.25 MG (50000 UNIT) CAPS capsule Take 1 capsule (50,000 Units total) by mouth every 14 (fourteen) days. 08/05/20   Kuneff, Renee A, DO    Allergies    Codeine  Review of Systems   Review of Systems  Constitutional:  Positive for activity change, appetite change, chills and fatigue. Negative for fever.  HENT:  Negative for congestion and rhinorrhea.   Eyes:  Negative for visual disturbance.  Respiratory:  Negative for cough, chest tightness and shortness of breath.   Cardiovascular:  Positive for syncope. Negative for chest pain.  Gastrointestinal:  Negative for abdominal pain, nausea and vomiting.  Genitourinary:  Negative for dysuria and hematuria.  Musculoskeletal:  Positive for arthralgias and myalgias.  Skin:  Negative for rash.  Neurological:  Positive for dizziness, syncope, weakness, light-headedness and headaches.   all other systems are negative except as noted in the HPI and PMH.   Physical Exam Updated Vital Signs BP 109/67   Pulse 79   Temp 98.3 F (36.8 C) (Oral)   Resp 20   Ht '5\' 3"'$  (1.6 m)   Wt 81.6 kg   SpO2 95%   BMI 31.89 kg/m   Physical Exam Vitals and nursing note reviewed.  Constitutional:      General: She is not in acute distress.    Appearance: She is well-developed.  HENT:     Head: Normocephalic and atraumatic.     Mouth/Throat:     Mouth: Mucous membranes are dry.     Pharynx: No oropharyngeal exudate.  Eyes:     Conjunctiva/sclera: Conjunctivae normal.     Pupils: Pupils are equal, round, and reactive to light.  Neck:     Comments: No C spine tenderness Cardiovascular:     Rate and Rhythm: Normal rate and regular rhythm.      Heart sounds: Normal heart sounds. No murmur heard. Pulmonary:     Effort: Pulmonary effort is normal.  No respiratory distress.     Breath sounds: Normal breath sounds.  Abdominal:     Palpations: Abdomen is soft.     Tenderness: There is no abdominal tenderness. There is no guarding or rebound.  Musculoskeletal:        General: No tenderness. Normal range of motion.     Cervical back: Normal range of motion and neck supple.  Skin:    General: Skin is warm.  Neurological:     Mental Status: She is alert and oriented to person, place, and time.     Cranial Nerves: No cranial nerve deficit.     Motor: No abnormal muscle tone.     Coordination: Coordination normal.     Comments:  5/5 strength throughout. CN 2-12 intact.Equal grip strength.   Psychiatric:        Behavior: Behavior normal.    ED Results / Procedures / Treatments   Labs (all labs ordered are listed, but only abnormal results are displayed) Labs Reviewed  RESP PANEL BY RT-PCR (FLU A&B, COVID) ARPGX2 - Abnormal; Notable for the following components:      Result Value   SARS Coronavirus 2 by RT PCR POSITIVE (*)    All other components within normal limits  BASIC METABOLIC PANEL - Abnormal; Notable for the following components:   Glucose, Bld 120 (*)    Calcium 8.5 (*)    All other components within normal limits  CBG MONITORING, ED - Abnormal; Notable for the following components:   Glucose-Capillary 117 (*)    All other components within normal limits  CBC  HEPATIC FUNCTION PANEL  LIPASE, BLOOD  URINALYSIS, ROUTINE W REFLEX MICROSCOPIC  TROPONIN I (HIGH SENSITIVITY)  TROPONIN I (HIGH SENSITIVITY)    EKG EKG Interpretation  Date/Time:  Sunday January 18 2021 04:11:15 EDT Ventricular Rate:  70 PR Interval:  153 QRS Duration: 88 QT Interval:  393 QTC Calculation: 424 R Axis:   -9 Text Interpretation: Sinus rhythm RSR' in V1 or V2, probably normal variant No previous ECGs available Confirmed by Ezequiel Essex [phone]) on 01/18/2021 4:24:59 AM  Radiology CT HEAD WO CONTRAST (5MM)  Result Date: 01/18/2021 CLINICAL DATA:  Head trauma, loss of consciousness, COVID positive EXAM: CT HEAD WITHOUT CONTRAST TECHNIQUE: Contiguous axial images were obtained from the base of the skull through the vertex without intravenous contrast. COMPARISON:  None. FINDINGS: Brain: No evidence of acute infarction, hemorrhage, hydrocephalus, extra-axial collection or mass lesion/mass effect. Vascular: No hyperdense vessel or unexpected calcification. Skull: Normal. Negative for fracture or focal lesion. Sinuses/Orbits: No acute finding. Other: None. IMPRESSION: Normal head CT without contrast for age Electronically Signed   By: Jerilynn Mages.  Shick M.D.   On: 01/18/2021 08:36   DG Chest Portable 1 View  Result Date: 01/18/2021 CLINICAL DATA:  Weakness, chills, COVID-19 positive EXAM: PORTABLE CHEST 1 VIEW COMPARISON:  None. FINDINGS: Normal heart size. Normal mediastinal contour. No pneumothorax. No pleural effusion. Lungs appear clear, with no acute consolidative airspace disease and no pulmonary edema. IMPRESSION: No active disease. Electronically Signed   By: Ilona Sorrel M.D.   On: 01/18/2021 05:46    Procedures Procedures   Medications Ordered in ED Medications - No data to display  ED Course  I have reviewed the triage vital signs and the nursing notes.  Pertinent labs & imaging results that were available during my care of the patient were reviewed by me and considered in my medical decision making (see chart for details).    MDM  Rules/Calculators/A&P                           Patient with chills, poor appetite, body aches with syncopal episode today.  No chest pain or shortness of breath.  Stable vitals.  EKG with sinus rhythm, no prolonged QT, no Brugada.  Appears dry. IVF given. Labs obtained.  COVID is positive.   CXR negative. No hypoxia or increased work of breathing. Labs reassuring. Troponin negative x2.  Low suspicion for ACS or pulmonary embolism.   Will check orthostatics, pulse ox with ambulation. Anticipate patient will likely be able to go home with supportive care, quarantine precautions and antivirals.  Suspect likely vasovagal syncope. CT head negative.   Awaiting PO and ambulation trial and urinalysis as well as second troponin.  Dr. Alvino Chapel to assume care at shift change.  Final Clinical Impression(s) / ED Diagnoses Final diagnoses:  COVID-19  Syncope and collapse    Rx / DC Orders ED Discharge Orders     None        Kassadi Presswood, Annie Main, MD 01/18/21 [phone]

## 2021-01-27 ENCOUNTER — Encounter: Payer: Self-pay | Admitting: Family Medicine

## 2021-01-27 ENCOUNTER — Telehealth: Payer: Self-pay | Admitting: Family Medicine

## 2021-01-27 ENCOUNTER — Ambulatory Visit (INDEPENDENT_AMBULATORY_CARE_PROVIDER_SITE_OTHER): Payer: Medicare HMO | Admitting: Family Medicine

## 2021-01-27 ENCOUNTER — Other Ambulatory Visit: Payer: Self-pay

## 2021-01-27 VITALS — BP 122/81 | HR 69 | Temp 98.1°F | Ht 63.39 in | Wt 180.2 lb

## 2021-01-27 DIAGNOSIS — U071 COVID-19: Secondary | ICD-10-CM

## 2021-01-27 DIAGNOSIS — G479 Sleep disorder, unspecified: Secondary | ICD-10-CM

## 2021-01-27 DIAGNOSIS — R7309 Other abnormal glucose: Secondary | ICD-10-CM | POA: Diagnosis not present

## 2021-01-27 DIAGNOSIS — I10 Essential (primary) hypertension: Secondary | ICD-10-CM

## 2021-01-27 DIAGNOSIS — E669 Obesity, unspecified: Secondary | ICD-10-CM | POA: Diagnosis not present

## 2021-01-27 DIAGNOSIS — M810 Age-related osteoporosis without current pathological fracture: Secondary | ICD-10-CM

## 2021-01-27 DIAGNOSIS — Z1231 Encounter for screening mammogram for malignant neoplasm of breast: Secondary | ICD-10-CM | POA: Diagnosis not present

## 2021-01-27 DIAGNOSIS — Z0000 Encounter for general adult medical examination without abnormal findings: Secondary | ICD-10-CM

## 2021-01-27 DIAGNOSIS — E559 Vitamin D deficiency, unspecified: Secondary | ICD-10-CM

## 2021-01-27 DIAGNOSIS — E034 Atrophy of thyroid (acquired): Secondary | ICD-10-CM

## 2021-01-27 DIAGNOSIS — Z1211 Encounter for screening for malignant neoplasm of colon: Secondary | ICD-10-CM | POA: Diagnosis not present

## 2021-01-27 DIAGNOSIS — E782 Mixed hyperlipidemia: Secondary | ICD-10-CM | POA: Diagnosis not present

## 2021-01-27 DIAGNOSIS — E039 Hypothyroidism, unspecified: Secondary | ICD-10-CM

## 2021-01-27 LAB — T4, FREE: Free T4: 0.85 ng/dL (ref 0.60–1.60)

## 2021-01-27 LAB — VITAMIN D 25 HYDROXY (VIT D DEFICIENCY, FRACTURES): VITD: 43.31 ng/mL (ref 30.00–100.00)

## 2021-01-27 LAB — LIPID PANEL
Cholesterol: 205 mg/dL — ABNORMAL HIGH (ref 0–200)
HDL: 65.1 mg/dL (ref >39.00)
LDL Cholesterol: 116 mg/dL — ABNORMAL HIGH (ref 0–99)
NonHDL: 139.56
Total CHOL/HDL Ratio: 3
Triglycerides: 118 mg/dL (ref 0.0–149.0)
VLDL: 23.6 mg/dL (ref 0.0–40.0)

## 2021-01-27 LAB — HEMOGLOBIN A1C: Hgb A1c MFr Bld: 6 % (ref 4.6–6.5)

## 2021-01-27 LAB — TSH: TSH: 4.85 u[IU]/mL (ref 0.35–5.50)

## 2021-01-27 MED ORDER — ALENDRONATE SODIUM 70 MG PO TABS: 70.0000 mg | ORAL_TABLET | ORAL | 11 refills | Status: DC

## 2021-01-27 MED ORDER — LEVOTHYROXINE SODIUM 100 MCG PO TABS: 100.0000 ug | ORAL_TABLET | Freq: Every day | ORAL | 3 refills | Status: DC

## 2021-01-27 MED ORDER — HYDROCHLOROTHIAZIDE 25 MG PO TABS: 25.0000 mg | ORAL_TABLET | Freq: Every day | ORAL | 1 refills | Status: DC

## 2021-01-27 MED ORDER — TRAZODONE HCL 50 MG PO TABS: 25.0000 mg | ORAL_TABLET | Freq: Every evening | ORAL | 5 refills | Status: DC | PRN

## 2021-01-27 MED ORDER — AMLODIPINE BESYLATE 10 MG PO TABS: 10.0000 mg | ORAL_TABLET | Freq: Every day | ORAL | 1 refills | Status: DC

## 2021-01-27 NOTE — Progress Notes (Signed)
This visit occurred during the SARS-CoV-2 public health emergency.  Safety protocols were in place, including screening questions prior to the visit, additional usage of staff PPE, and extensive cleaning of exam room while observing appropriate contact time as indicated for disinfecting solutions.    Patient ID: Alexis Beard, female  DOB: 07/27/1945, 75 y.o.   MRN: [id number] Patient Care Team    Relationship Specialty Notifications Start End  Ma Hillock, DO PCP - General Family Medicine  12/20/16   Rolm Bookbinder, MD Consulting Physician Dermatology  12/22/16   Elayne Snare, MD Consulting Physician Endocrinology  12/22/16     Chief Complaint  Patient presents with   Annual Exam    12 days post covid. Pt is fasting. All medications taken today but did not take medication for a week.    Subjective: Alexis Beard is a 75 y.o.  Female  present for CPE/CMC/ s/p ED visit. . All past medical history, surgical history, allergies, family history, immunizations, medications and social history were updated in the electronic medical record today. All recent labs, ED visits and hospitalizations within the last year were reviewed.  Health maintenance:  Colonoscopy: completed 2011. > over ten years now.  Mammogram: completed 01/2019, [facility] [address] ordered today..  Immunizations: tdap up-to-date 2014, Influenza up-to-date 2019- declined today, only low-dose shall be administered (encouraged yearly), PNA series completed, Shingrix declined, covid vac declined > covid 9 days ago.  Infectious disease screening: Hep C screening completed today DEXA: last completed 01/2019 T score -4.2 osteoporosis.>>ordered Assistive device: none Oxygen Alexis Beard Patient has a Dental home. Hospitalizations/ED visits: reviewed> recent ED 01/18/2021 for covid.   Hypertension/morbid obesity: Pt reports compliance with amlodipine 10 mg daily and HCTZ 25 mg a day.  Patient denies chest pain, shortness of breath, dizziness or  lower extremity edema.  Pt does not take a daily baby ASA. Pt is not  prescribed statin. Diet: Low-sodium diet  Exercise: Not routinely RF: Hypertension, family history present, obesity   Acquired hypothyroidism tsh normal 02/2020.Patient reports compliance with Synthroid (name brand) 100 mcg daily on an empty stomach.  No complaints   Sleep disturbance: Patient reports appliance with  trazodone and sleep pattern is controlled with medication.  She typically takes a half a tab/25 mg nightly but occasionally will take a whole tablet 50 mg nightly.   Osteoporosis: Patient reports compliance with Fosamax 70 mg q. 7 days.  She is compliant with high-dose 50,000 units ergocalciferol every 14 days. Vit D 43 (02/2020)  Discussed recent ED admission for COVID.  Patient reports she is recovering well.  Still having some fatigue but overall much improved.  Depression screen [facility] 2/9 01/27/2021 04/30/2020 02/19/2020 12/27/2019 12/26/2018  Decreased Interest 2 0 0 0 0  Down, Depressed, Hopeless 1 0 0 0 0  PHQ - 2 Score 3 0 0 0 0  Altered sleeping 1 - - - -  Tired, decreased energy 1 - - - -  Change in appetite 1 - - - -  Feeling bad or failure about yourself  0 - - - -  Trouble concentrating 0 - - - -  Moving slowly or fidgety/restless 0 - - - -  Suicidal thoughts 0 - - - -  PHQ-9 Score 6 - - - -  Difficult doing work/chores - - - - -   GAD 7 : Generalized Anxiety Score 01/27/2021  Nervous, Anxious, on Edge 0  Control/stop worrying 0  Worry too much - different things  1  Trouble relaxing 1  Restless 1  Easily annoyed or irritable 0  Afraid - awful might happen 0  Total GAD 7 Score 3    Immunization History  Administered Date(s) Administered   Influenza, High Dose Seasonal PF 04/11/2018   Pneumococcal Conjugate-13 08/25/2015   Pneumococcal Polysaccharide-23 02/19/2020   Tdap 08/27/2012   Past Medical History:  Diagnosis Date   Anemia    Arthritis    Basal cell carcinoma (BCC)     Basal cell Carcinoma skin CA, on foot   Cataract    Colon polyps    Diverticulosis    GERD (gastroesophageal reflux disease)    OCC   Hypertension    Hypothyroid    Migraines    Osteoporosis    Allergies  Allergen Reactions   Codeine Nausea And Vomiting   Past Surgical History:  Procedure Laterality Date   BASAL CELL CARCINOMA EXCISION  09/2015   Back   birth mark removal  1998   on (R) side of neck   CARPAL [place]   Family History  Problem Relation Age of Onset   Diabetes Mother    Thyroid disease Mother    Heart disease Mother 86   Hyperlipidemia Father    Heart disease Father 31   Hyperlipidemia Sister    Cancer Maternal Aunt        Breast Cancer   Thyroid disease Maternal Aunt    Melanoma Maternal Aunt    Colon cancer Neg Hx    Esophageal cancer Neg Hx    Rectal cancer Neg Hx    Stomach cancer Neg Hx    Social History   Social History Narrative   Married to [place], has children.   High school graduate.   Drinks caffeine.   Wears her seatbelt, wears a bicycle helmet.   Wears glasses.   Smoke detector in the home, firearms locked in the home.   Feels safe in her relationships.   Right handed.    Allergies as of 01/27/2021       Reactions   Codeine Nausea And Vomiting        Medication List        Accurate as of January 27, 2021 11:59 PM. If you have any questions, ask your nurse or doctor.          alendronate 70 MG tablet Commonly known as: FOSAMAX Take 1 tablet (70 mg total) by mouth every 7 (seven) days. Take with a full glass of water on an empty stomach.   amLODipine 10 MG tablet Commonly known as: NORVASC Take 1 tablet (10 mg total) by mouth daily.   hydrochlorothiazide 25 MG tablet Commonly known as: HYDRODIURIL Take 1 tablet (25 mg total) by mouth daily.   levothyroxine 100 MCG tablet Commonly known as: Synthroid Take 1 tablet (100 mcg total) by mouth daily before  breakfast.   MIRALAX PO Take by mouth. Takes weekly   NAPROXEN PO Take 500 mg by mouth daily as needed.   traZODone 50 MG tablet Commonly known as: DESYREL Take 0.5-1 tablets (25-50 mg total) by mouth at bedtime as needed for sleep.   Vitamin D (Ergocalciferol) 1.25 MG (50000 UNIT) Caps capsule Commonly known as: DRISDOL Take 1 capsule (50,000 Units total) by mouth every 14 (fourteen) days.        All past medical history, surgical history, allergies, family history, immunizations andmedications were updated in the  EMR today and reviewed under the history and medication portions of their EMR.     ROS: 14 pt review of systems performed and negative (unless mentioned in an HPI)  Objective: BP 122/81   Pulse 69   Temp 98.1 F (36.7 C)   Ht 5' 3.39" (1.61 m)   Wt 180 lb 3.2 oz (81.7 kg)   SpO2 96%   BMI 31.53 kg/m  Gen: Afebrile. No acute distress. Nontoxic in appearance, well-developed, well-nourished,very pleasant obese female HENT: AT. [place]. Bilateral TM visualized and normal in appearance, normal external auditory canal. MMM, no oral lesions, adequate dentition. Bilateral nares within normal limits. Throat without erythema, ulcerations or exudates.  No cough on exam, no hoarseness on exam. Eyes:Pupils Equal Round Reactive to light, Extraocular movements intact,  Conjunctiva without redness, discharge or icterus. Neck/lymp/endocrine: Supple, no lymphadenopathy, no thyromegaly CV: RRR no murmur, no edema, +2/4 P posterior tibialis pulses.  Chest: CTAB, no wheeze, rhonchi or crackles.  Normal respiratory effort.  Good air movement. Abd: Soft.  Obese. NTND. BS present.  No masses palpated. No hepatosplenomegaly. No rebound tenderness or guarding. Skin: no rashes, purpura or petechiae. Warm and well-perfused. Skin intact. Neuro/Msk: Normal gait. PERLA. EOMi. Alert. Oriented x3.  Cranial nerves II through XII intact. Muscle strength 5/5 upper/lower extremity. DTRs equal  bilaterally. Psych: Normal affect, dress and demeanor. Normal speech. Normal thought content and judgment.  No results found.  Assessment/plan: ZAHNIYAH RIGHTMYER is a 75 y.o. female present for [place] Essential hypertension/HLD/morbid obesity Stable. - Low-sodium, exercise.  -Continue amlodipine 10 mg daily. -Continue HCTZ 25 mg daily.   Lipid panel collected today   Acquired hypothyroidism Stable.   Continue Synthroid 100 mcg daily,- labs UTD 02/2020 TSH and T4 collected today  Insomnia: Stable Continue trazodone 25-50 mg nightly  COVID-19: Patient recovering well. Encouraged her to continue to rest and hydrate.  Fatigue should continue to improve over the next couple weeks.  Vitamin D deficiency/Osteoporosis without current pathological fracture, unspecified osteoporosis type - DG Bone Density; Future - VITAMIN D 25 Hydroxy (Vit-D Deficiency, Fractures) -Fosamax 70 mg once weekly Elevated hemoglobin A1c - Hemoglobin A1c Breast cancer screening by mammogram - MM DIGITAL SCREENING BILATERAL; Future Colon cancer screening - Ambulatory referral to Gastroenterology  Routine general medical examination at a health care facility Colonoscopy: completed 2011. > over ten years now.  Mammogram: completed 01/2019, [facility] ordered today..  Immunizations: tdap up-to-date 2014, Influenza up-to-date 2019- declined today, only low-dose shall be administered (encouraged yearly), PNA series completed, Shingrix declined, covid vac declined > covid 9 days ago.  Infectious disease screening: Hep C screening completed today DEXA: last completed 01/2019 T score -4.2 osteoporosis.>>ordered Patient was encouraged to exercise greater than 150 minutes a week. Patient was encouraged to choose a diet filled with fresh fruits and vegetables, and lean meats. AVS provided to patient today for education/recommendation on gender specific health and safety maintenance.  Return in about 5 months  (around 07/13/2021) for [place] (30 min) and 1 yr 1d cpe/cmc.   Orders Placed This Encounter  Procedures   MM DIGITAL SCREENING BILATERAL   DG Bone Density   Hemoglobin A1c   Lipid panel   TSH   VITAMIN D 25 Hydroxy (Vit-D Deficiency, Fractures)   T4, free   Ambulatory referral to Gastroenterology   Meds ordered this encounter  Medications   alendronate (FOSAMAX) 70 MG tablet    Sig: Take 1 tablet (70 mg total) by mouth every 7 (seven) days.  Take with a full glass of water on an empty stomach.    Dispense:  4 tablet    Refill:  11   amLODipine (NORVASC) 10 MG tablet    Sig: Take 1 tablet (10 mg total) by mouth daily.    Dispense:  90 tablet    Refill:  1   hydrochlorothiazide (HYDRODIURIL) 25 MG tablet    Sig: Take 1 tablet (25 mg total) by mouth daily.    Dispense:  90 tablet    Refill:  1   traZODone (DESYREL) 50 MG tablet    Sig: Take 0.5-1 tablets (25-50 mg total) by mouth at bedtime as needed for sleep.    Dispense:  30 tablet    Refill:  5   Referral Orders         Ambulatory referral to Gastroenterology       Electronically signed by: Howard Pouch, DO [place]

## 2021-01-27 NOTE — Telephone Encounter (Signed)
Please call patient Vitamin D levels are normal. Thyroid levels are normal.  I have refilled her medications for her. Diabetes screening/A1c is in the prediabetic range at 6.0, was 5.8.  Would encourage her to ensure she is getting routine exercise of at least 150 minutes a week, and follow a low sugar diet. Cholesterol panel looks good and is at goal for her with the LDL/bad cholesterol of 116, normal HDL and triglycerides.

## 2021-01-27 NOTE — Patient Instructions (Signed)
Great to see you today.  I have refilled the medication(s) we provide.   If labs were collected, we will inform you of lab results once received either by echart message or telephone call.   - echart message- for normal results that have been seen by the patient already.   - telephone call: abnormal results or if patient has not viewed results in their echart.   Health Maintenance After Age 75 After age 75, you are at a higher risk for certain long-term diseases and infections as well as injuries from falls. Falls are a major cause of broken bones and head injuries in people who are older than age 75. Getting regular preventive care can help to keep you healthy and well. Preventive care includes getting regular testing and making lifestyle changes as recommended by your health care provider. Talk with your health care provider about: Which screenings and tests you should have. A screening is a test that checks for a disease when you have no symptoms. A diet and exercise plan that is right for you. What should I know about screenings and tests to prevent falls? Screening and testing are the best ways to find a health problem early. Early diagnosis and treatment give you the best chance of managing medical conditions that are common after age 75. Certain conditions and lifestyle choices may make you more likely to have a fall. Your health care provider may recommend: Regular vision checks. Poor vision and conditions such as cataracts can make you more likely to have a fall. If you wear glasses, make sure to get your prescription updated if your vision changes. Medicine review. Work with your health care provider to regularly review all of the medicines you are taking, including over-the-counter medicines. Ask your health care provider about any side effects that may make you more likely to have a fall. Tell your health care provider if any medicines that you take make you feel dizzy or  sleepy. Osteoporosis screening. Osteoporosis is a condition that causes the bones to get weaker. This can make the bones weak and cause them to break more easily. Blood pressure screening. Blood pressure changes and medicines to control blood pressure can make you feel dizzy. Strength and balance checks. Your health care provider may recommend certain tests to check your strength and balance while standing, walking, or changing positions. Foot health exam. Foot pain and numbness, as well as not wearing proper footwear, can make you more likely to have a fall. Depression screening. You may be more likely to have a fall if you have a fear of falling, feel emotionally low, or feel unable to do activities that you used to do. Alcohol use screening. Using too much alcohol can affect your balance and may make you more likely to have a fall. What actions can I take to lower my risk of falls? General instructions Talk with your health care provider about your risks for falling. Tell your health care provider if: You fall. Be sure to tell your health care provider about all falls, even ones that seem minor. You feel dizzy, sleepy, or off-balance. Take over-the-counter and prescription medicines only as told by your health care provider. These include any supplements. Eat a healthy diet and maintain a healthy weight. A healthy diet includes low-fat dairy products, low-fat (lean) meats, and fiber from whole grains, beans, and lots of fruits and vegetables. Home safety Remove any tripping hazards, such as rugs, cords, and clutter. Install safety equipment such as   grab bars in bathrooms and safety rails on stairs. Keep rooms and walkways well-lit. Activity  Follow a regular exercise program to stay fit. This will help you maintain your balance. Ask your health care provider what types of exercise are appropriate for you. If you need a cane or walker, use it as recommended by your health care provider. Wear  supportive shoes that have nonskid soles. Lifestyle Do not drink alcohol if your health care provider tells you not to drink. If you drink alcohol, limit how much you have: 0-1 drink a day for women. 0-2 drinks a day for men. Be aware of how much alcohol is in your drink. In the [place] one drink equals one typical bottle of beer (12 oz), one-half glass of wine (5 oz), or one shot of hard liquor (1 oz). Do not use any products that contain nicotine or tobacco, such as cigarettes and e-cigarettes. If you need help quitting, ask your health care provider. Summary Having a healthy lifestyle and getting preventive care can help to protect your health and wellness after age 75. Screening and testing are the best way to find a health problem early and help you avoid having a fall. Early diagnosis and treatment give you the best chance for managing medical conditions that are more common for people who are older than age 75. Falls are a major cause of broken bones and head injuries in people who are older than age 75. Take precautions to prevent a fall at home. Work with your health care provider to learn what changes you can make to improve your health and wellness and to prevent falls. This information is not intended to replace advice given to you by your health care provider. Make sure you discuss any questions you have with your health care provider. Document Revised: 07/11/2020 Document Reviewed: 04/18/2020 Elsevier Patient Education  2022 Elsevier Inc.  

## 2021-01-28 NOTE — Telephone Encounter (Signed)
Spoke with patient regarding results/recommendations.  

## 2021-01-29 DIAGNOSIS — U071 COVID-19: Secondary | ICD-10-CM | POA: Insufficient documentation

## 2021-01-29 HISTORY — DX: COVID-19: U07.1

## 2021-02-19 DIAGNOSIS — M25562 Pain in left knee: Secondary | ICD-10-CM | POA: Diagnosis not present

## 2021-02-24 DIAGNOSIS — M25562 Pain in left knee: Secondary | ICD-10-CM | POA: Diagnosis not present

## 2021-03-23 DIAGNOSIS — M170 Bilateral primary osteoarthritis of knee: Secondary | ICD-10-CM | POA: Diagnosis not present

## 2021-03-24 DIAGNOSIS — M1712 Unilateral primary osteoarthritis, left knee: Secondary | ICD-10-CM | POA: Insufficient documentation

## 2021-04-15 DIAGNOSIS — M1712 Unilateral primary osteoarthritis, left knee: Secondary | ICD-10-CM | POA: Diagnosis not present

## 2021-04-22 DIAGNOSIS — M79672 Pain in left foot: Secondary | ICD-10-CM | POA: Diagnosis not present

## 2021-04-22 DIAGNOSIS — M1712 Unilateral primary osteoarthritis, left knee: Secondary | ICD-10-CM | POA: Diagnosis not present

## 2021-04-29 DIAGNOSIS — M1712 Unilateral primary osteoarthritis, left knee: Secondary | ICD-10-CM | POA: Diagnosis not present

## 2021-05-06 ENCOUNTER — Ambulatory Visit (INDEPENDENT_AMBULATORY_CARE_PROVIDER_SITE_OTHER): Payer: Medicare HMO

## 2021-05-06 ENCOUNTER — Ambulatory Visit: Payer: Medicare HMO

## 2021-05-06 ENCOUNTER — Other Ambulatory Visit: Payer: Self-pay

## 2021-05-06 VITALS — BP 138/75 | HR 75 | Temp 98.3°F | Wt 184.1 lb

## 2021-05-06 DIAGNOSIS — Z0000 Encounter for general adult medical examination without abnormal findings: Secondary | ICD-10-CM

## 2021-05-06 NOTE — Progress Notes (Addendum)
Virtual Visit via Telephone Note  I connected with  Alexis Beard on 05/06/21 at  2:30 PM EST by telephone and verified that I am speaking with the correct person using two identifiers.  Medicare Annual Wellness visit completed telephonically due to Covid-19 pandemic.   Persons participating in this call: This Health Coach and this patient.   Location: Patient: home Provider: office   I discussed the limitations, risks, security and privacy concerns of performing an evaluation and management service by telephone and the availability of in person appointments. The patient expressed understanding and agreed to proceed.  Unable to perform video visit due to video visit attempted and failed and/or patient does not have video capability.   Some vital signs may be absent or patient reported.   Willette Brace, LPN   Subjective:   Alexis Beard is a 75 y.o. female who presents for an Initial Medicare Annual Wellness Visit.  Review of Systems     Cardiac Risk Factors include: advanced age (>57men, >62 women);hypertension;dyslipidemia;obesity (BMI >30kg/m2)     Objective:    Today's Vitals   05/06/21 1430  BP: 138/75  Pulse: 75  Temp: 98.3 F (36.8 C)  SpO2: 96%  Weight: 184 lb 1.9 oz (83.5 kg)  PainSc: 2    Body mass index is 32.22 kg/m.  Advanced Directives 05/06/2021 01/18/2021 04/30/2020  Does Patient Have a Medical Advance Directive? Yes No No  Type of Advance Directive Living will - -  Would patient like information on creating a medical advance directive? - No - Patient declined Yes (MAU/Ambulatory/Procedural Areas - Information given)    Current Medications (verified) Outpatient Encounter Medications as of 05/06/2021  Medication Sig   alendronate (FOSAMAX) 70 MG tablet Take 1 tablet (70 mg total) by mouth every 7 (seven) days. Take with a full glass of water on an empty stomach.   amLODipine (NORVASC) 10 MG tablet Take 1 tablet (10 mg total) by mouth daily.    levothyroxine (SYNTHROID) 100 MCG tablet Take 1 tablet (100 mcg total) by mouth daily before breakfast.   Polyethylene Glycol 3350 (MIRALAX PO) Take by mouth. Takes weekly   traZODone (DESYREL) 50 MG tablet Take 0.5-1 tablets (25-50 mg total) by mouth at bedtime as needed for sleep.   Vitamin D, Ergocalciferol, (DRISDOL) 1.25 MG (50000 UNIT) CAPS capsule Take 1 capsule (50,000 Units total) by mouth every 14 (fourteen) days.   [DISCONTINUED] hydrochlorothiazide (HYDRODIURIL) 25 MG tablet Take 1 tablet (25 mg total) by mouth daily. (Patient not taking: Reported on 05/06/2021)   [DISCONTINUED] NAPROXEN PO Take 500 mg by mouth daily as needed. (Patient not taking: Reported on 05/06/2021)   No facility-administered encounter medications on file as of 05/06/2021.    Allergies (verified) Codeine   History: Past Medical History:  Diagnosis Date   Anemia    Arthritis    Basal cell carcinoma (BCC)    Basal cell Carcinoma skin CA, on foot   Cataract    Colon polyps    Diverticulosis    GERD (gastroesophageal reflux disease)    OCC   Hypertension    Hypothyroid    Migraines    Osteoporosis    Past Surgical History:  Procedure Laterality Date   BASAL CELL CARCINOMA EXCISION  09/2015   Back   birth mark removal  1998   on (R) side of neck   CARPAL [place]   Family History  Problem Relation Age of Onset   Diabetes Mother    Thyroid disease Mother    Heart disease Mother 95   Hyperlipidemia Father    Heart disease Father 68   Hyperlipidemia Sister    Cancer Maternal Aunt        Breast Cancer   Thyroid disease Maternal Aunt    Melanoma Maternal Aunt    Colon cancer Neg Hx    Esophageal cancer Neg Hx    Rectal cancer Neg Hx    Stomach cancer Neg Hx    Social History   Socioeconomic History   Marital status: Married    Spouse name: Jeneen Rinks   Number of children: 3   Years of education: 12   Highest education level: Not on  file  Occupational History   Occupation: Controller  Tobacco Use   Smoking status: Never   Smokeless tobacco: Never  Vaping Use   Vaping Use: Never used  Substance and Sexual Activity   Alcohol use: No   Drug use: No   Sexual activity: Never    Partners: Male    Birth control/protection: Condom  Other Topics Concern   Not on file  Social History Narrative   Married to [place], has children.   High school graduate.   Drinks caffeine.   Wears her seatbelt, wears a bicycle helmet.   Wears glasses.   Smoke detector in the home, firearms locked in the home.   Feels safe in her relationships.   Right handed.   Social Determinants of Health   Financial Resource Strain: Low Risk    Difficulty of Paying Living Expenses: Not hard at all  Food Insecurity: No Food Insecurity   Worried About Charity fundraiser in the Last Year: Never true   [place] in the Last Year: Never true  Transportation Needs: No Transportation Needs   Lack of Transportation (Medical): No   Lack of Transportation (Non-Medical): No  Physical Activity: Inactive   Days of Exercise per Week: 0 days   Minutes of Exercise per Session: 0 min  Stress: No Stress Concern Present   Feeling of Stress : Not at all  Social Connections: Socially Integrated   Frequency of Communication with Friends and Family: More than three times a week   Frequency of Social Gatherings with Friends and Family: More than three times a week   Attends Religious Services: More than 4 times per year   Active Member of [employer] or Organizations: Yes   Attends Archivist Meetings: 1 to 4 times per year   Marital Status: Married    Tobacco Counseling Counseling given: Not Answered   Clinical Intake:  Pre-visit preparation completed: Yes  Pain : 0-10 Pain Score: 2  Pain Type: Chronic pain Pain Location: Knee Pain Orientation: Left Pain Descriptors / Indicators: Aching Pain Onset: 1 to 4 weeks ago Pain Frequency:  Intermittent     BMI - recorded: 32.22 Nutritional Status: BMI > 30  Obese Nutritional Risks: None Diabetes: No  How often do you need to have someone help you when you read instructions, pamphlets, or other written materials from your doctor or pharmacy?: 1 - Never  Diabetic?No  Interpreter Needed?: No  Information entered by :: Charlott Rakes, LPN   Activities of Daily Living In your present state of health, do you have any difficulty performing the following activities: 05/06/2021  Hearing? N  Vision? N  Difficulty concentrating or making decisions? N  Walking or climbing stairs?  N  Dressing or bathing? N  Doing errands, shopping? N  Preparing Food and eating ? N  Using the Toilet? N  In the past six months, have you accidently leaked urine? N  Do you have problems with loss of bowel control? N  Managing your Medications? N  Managing your Finances? N  Housekeeping or managing your Housekeeping? N  Some recent data might be hidden    Patient Care Team: Ma Hillock, DO as PCP - General (Family Medicine) Rolm Bookbinder, MD as Consulting Physician (Dermatology) Elayne Snare, MD as Consulting Physician (Endocrinology)  Indicate any recent Medical Services you may have received from other than [facility] providers in the past year (date may be approximate).     Assessment:   This is a routine wellness examination for [place].  Hearing/Vision screen Hearing Screening - Comments:: Pt denies any hearing issues  Vision Screening - Comments:: Pt follows up with my eye Dr for annual eye exams   Dietary issues and exercise activities discussed: Current Exercise Habits: The patient does not participate in regular exercise at present   Goals Addressed             This Visit's Progress    Patient Stated       Lose 20 lbs       Depression Screen PHQ 2/9 Scores 05/06/2021 01/27/2021 04/30/2020 02/19/2020 12/27/2019 12/26/2018 04/12/2018  PHQ - 2 Score 0 3 0 0 0 0 0  PHQ- 9  Score - 6 - - - - 0    Fall Risk Fall Risk  05/06/2021 04/30/2020 02/19/2020 12/27/2019 06/26/2018  Falls in the past year? 0 1 0 0 1  Number falls in past yr: 0 0 0 0 0  Injury with Fall? 0 0 0 - 1  Risk for fall due to : Impaired vision - - No Fall Risks Impaired vision;Medication side effect  Follow up Falls prevention discussed Falls prevention discussed Falls evaluation completed Falls evaluation completed Education provided    [place]:  Any stairs in or around the home? Yes  If so, are there any without handrails? No  Home free of loose throw rugs in walkways, pet beds, electrical cords, etc? Yes  Adequate lighting in your home to reduce risk of falls? Yes   ASSISTIVE DEVICES UTILIZED TO PREVENT FALLS:  Life alert? No  Use of a cane, walker or w/c? No  Grab bars in the bathroom? Yes  Shower chair or bench in shower? Yes  Elevated toilet seat or a handicapped toilet? No   TIMED UP AND GO:  Was the test performed? Yes .  Length of time to ambulate 10 feet: 10 sec.   Gait steady and fast without use of assistive device  Cognitive Function:     6CIT Screen 05/06/2021  What Year? 0 points  What month? 0 points  What time? 0 points  Count back from 20 0 points  Months in reverse 0 points  Repeat phrase 0 points  Total Score 0    Immunizations Immunization History  Administered Date(s) Administered   Influenza, High Dose Seasonal PF 04/11/2018   Pneumococcal Conjugate-13 08/25/2015   Pneumococcal Polysaccharide-23 02/19/2020   Tdap 08/27/2012    TDAP status: Up to date  Flu Vaccine status: Due, Education has been provided regarding the importance of this vaccine. Advised may receive this vaccine at local pharmacy or Health Dept. Aware to provide a copy of the vaccination record if obtained from  local pharmacy or Health Dept. Verbalized acceptance and understanding.  Pneumococcal vaccine status: Up to date  Covid-19 vaccine  status: Declined, Education has been provided regarding the importance of this vaccine but patient still declined. Advised may receive this vaccine at local pharmacy or Health Dept.or vaccine clinic. Aware to provide a copy of the vaccination record if obtained from local pharmacy or Health Dept. Verbalized acceptance and understanding.  Qualifies for Shingles Vaccine? Yes   Zostavax completed No   Shingrix Completed?: No.    Education has been provided regarding the importance of this vaccine. Patient has been advised to call insurance company to determine out of pocket expense if they have not yet received this vaccine. Advised may also receive vaccine at local pharmacy or Health Dept. Verbalized acceptance and understanding.  Screening Tests Health Maintenance  Topic Date Due   COLONOSCOPY (Pts 45-78yrs Insurance coverage will need to be confirmed)  Never done   Zoster Vaccines- Shingrix (1 of 2) Never done   DEXA SCAN  01/18/2021   INFLUENZA VACCINE  05/16/2021 (Originally 12/15/2020)   TETANUS/TDAP  08/28/2022   Pneumonia Vaccine 2+ Years old  Completed   Hepatitis C Screening  Completed   HPV VACCINES  Aged Out   COVID-19 Vaccine  Discontinued    Health Maintenance  Health Maintenance Due  Topic Date Due   COLONOSCOPY (Pts 45-79yrs Insurance coverage will need to be confirmed)  Never done   Zoster Vaccines- Shingrix (1 of 2) Never done   DEXA SCAN  01/18/2021    Pt has a cologuard at home   Mammogram status: Completed 01/19/19. Repeat every year  Bone Density status: Completed 01/19/19. Results reflect: Bone density results: OSTEOPOROSIS. Repeat every 2 years.   Additional Screening:  Hepatitis C Screening: Completed 12/26/18  Vision Screening: Recommended annual ophthalmology exams for early detection of glaucoma and other disorders of the eye. Is the patient up to date with their annual eye exam?  Yes  Who is the provider or what is the name of the office in which the  patient attends annual eye exams? [facility] If pt is not established with a provider, would they like to be referred to a provider to establish care? No .   Dental Screening: Recommended annual dental exams for proper oral hygiene  Community Resource Referral / Chronic Care Management: CRR required this visit?  No   CCM required this visit?  No      Plan:     I have personally reviewed and noted the following in the patients chart:   Medical and social history Use of alcohol, tobacco or illicit drugs  Current medications and supplements including opioid prescriptions. Patient is not currently taking opioid prescriptions. Functional ability and status Nutritional status Physical activity Advanced directives List of other physicians Hospitalizations, surgeries, and ER visits in previous 12 months Vitals Screenings to include cognitive, depression, and falls Referrals and appointments  In addition, I have reviewed and discussed with patient certain preventive protocols, quality metrics, and best practice recommendations. A written personalized care plan for preventive services as well as general preventive health recommendations were provided to patient.     Willette Brace, LPN   90/30/0923   Nurse Notes:  None

## 2021-05-06 NOTE — Patient Instructions (Addendum)
Alexis Beard , Thank you for taking time to come for your Medicare Wellness Visit. I appreciate your ongoing commitment to your health goals. Please review the following plan we discussed and let me know if I can assist you in the future.   Screening recommendations/referrals: Colonoscopy: Pt has cologuard kit at home and /or will need to reschedule for colonoscopy Mammogram: Done 01/19/19 repeat every year Bone Density: Done 01/19/19 repeat every 2 years  Recommended yearly ophthalmology/optometry visit for glaucoma screening and checkup Recommended yearly dental visit for hygiene and checkup  Vaccinations: Influenza vaccine: Declined and discussed  Pneumococcal vaccine: Up to date Tdap vaccine: Done 08/27/12 repeat every 10 years  Shingles vaccine: Shingrix discussed. Please contact your pharmacy for coverage information.    Covid-19:Declined  Advanced directives: Please bring a copy of your health care power of attorney and living will to the office at your convenience.  Conditions/risks identified: Lose 20 lbs   Next appointment: Follow up in one year for your annual wellness visit    Preventive Care 75 Years and Older, Female Preventive care refers to lifestyle choices and visits with your health care provider that can promote health and wellness. What does preventive care include? A yearly physical exam. This is also called an annual well check. Dental exams once or twice a year. Routine eye exams. Ask your health care provider how often you should have your eyes checked. Personal lifestyle choices, including: Daily care of your teeth and gums. Regular physical activity. Eating a healthy diet. Avoiding tobacco and drug use. Limiting alcohol use. Practicing safe sex. Taking low-dose aspirin every day. Taking vitamin and mineral supplements as recommended by your health care provider. What happens during an annual well check? The services and screenings done by your health care  provider during your annual well check will depend on your age, overall health, lifestyle risk factors, and family history of disease. Counseling  Your health care provider may ask you questions about your: Alcohol use. Tobacco use. Drug use. Emotional well-being. Home and relationship well-being. Sexual activity. Eating habits. History of falls. Memory and ability to understand (cognition). Work and work Statistician. Reproductive health. Screening  You may have the following tests or measurements: Height, weight, and BMI. Blood pressure. Lipid and cholesterol levels. These may be checked every 5 years, or more frequently if you are over 75 years old. Skin check. Lung cancer screening. You may have this screening every year starting at age 75 if you have a 30-pack-year history of smoking and currently smoke or have quit within the past 15 years. Fecal occult blood test (FOBT) of the stool. You may have this test every year starting at age 75. Flexible sigmoidoscopy or colonoscopy. You may have a sigmoidoscopy every 5 years or a colonoscopy every 10 years starting at age 75. Hepatitis C blood test. Hepatitis B blood test. Sexually transmitted disease (STD) testing. Diabetes screening. This is done by checking your blood sugar (glucose) after you have not eaten for a while (fasting). You may have this done every 1-3 years. Bone density scan. This is done to screen for osteoporosis. You may have this done starting at age 75. Mammogram. This may be done every 1-2 years. Talk to your health care provider about how often you should have regular mammograms. Talk with your health care provider about your test results, treatment options, and if necessary, the need for more tests. Vaccines  Your health care provider may recommend certain vaccines, such as: Influenza vaccine. This  is recommended every year. Tetanus, diphtheria, and acellular pertussis (Tdap, Td) vaccine. You may need a Td  booster every 10 years. Zoster vaccine. You may need this after age 35. Pneumococcal 13-valent conjugate (PCV13) vaccine. One dose is recommended after age 75. Pneumococcal polysaccharide (PPSV23) vaccine. One dose is recommended after age 75 Talk to your health care provider about which screenings and vaccines you need and how often you need them. This information is not intended to replace advice given to you by your health care provider. Make sure you discuss any questions you have with your health care provider. Document Released: 05/30/2015 Document Revised: 01/21/2016 Document Reviewed: 03/04/2015 Elsevier Interactive Patient Education  2017 [place] Prevention in the Home Falls can cause injuries. They can happen to people of all ages. There are many things you can do to make your home safe and to help prevent falls. What can I do on the outside of my home? Regularly fix the edges of walkways and driveways and fix any cracks. Remove anything that might make you trip as you walk through a door, such as a raised step or threshold. Trim any bushes or trees on the path to your home. Use bright outdoor lighting. Clear any walking paths of anything that might make someone trip, such as rocks or tools. Regularly check to see if handrails are loose or broken. Make sure that both sides of any steps have handrails. Any raised decks and porches should have guardrails on the edges. Have any leaves, snow, or ice cleared regularly. Use sand or salt on walking paths during winter. Clean up any spills in your garage right away. This includes oil or grease spills. What can I do in the bathroom? Use night lights. Install grab bars by the toilet and in the tub and shower. Do not use towel bars as grab bars. Use non-skid mats or decals in the tub or shower. If you need to sit down in the shower, use a plastic, non-slip stool. Keep the floor dry. Clean up any water that spills on the floor  as soon as it happens. Remove soap buildup in the tub or shower regularly. Attach bath mats securely with double-sided non-slip rug tape. Do not have throw rugs and other things on the floor that can make you trip. What can I do in the bedroom? Use night lights. Make sure that you have a light by your bed that is easy to reach. Do not use any sheets or blankets that are too big for your bed. They should not hang down onto the floor. Have a firm chair that has side arms. You can use this for support while you get dressed. Do not have throw rugs and other things on the floor that can make you trip. What can I do in the kitchen? Clean up any spills right away. Avoid walking on wet floors. Keep items that you use a lot in easy-to-reach places. If you need to reach something above you, use a strong step stool that has a grab bar. Keep electrical cords out of the way. Do not use floor polish or wax that makes floors slippery. If you must use wax, use non-skid floor wax. Do not have throw rugs and other things on the floor that can make you trip. What can I do with my stairs? Do not leave any items on the stairs. Make sure that there are handrails on both sides of the stairs and use them. Fix handrails that  are broken or loose. Make sure that handrails are as long as the stairways. Check any carpeting to make sure that it is firmly attached to the stairs. Fix any carpet that is loose or worn. Avoid having throw rugs at the top or bottom of the stairs. If you do have throw rugs, attach them to the floor with carpet tape. Make sure that you have a light switch at the top of the stairs and the bottom of the stairs. If you do not have them, ask someone to add them for you. What else can I do to help prevent falls? Wear shoes that: Do not have high heels. Have rubber bottoms. Are comfortable and fit you well. Are closed at the toe. Do not wear sandals. If you use a stepladder: Make sure that it is  fully opened. Do not climb a closed stepladder. Make sure that both sides of the stepladder are locked into place. Ask someone to hold it for you, if possible. Clearly mark and make sure that you can see: Any grab bars or handrails. First and last steps. Where the edge of each step is. Use tools that help you move around (mobility aids) if they are needed. These include: Canes. Walkers. Scooters. Crutches. Turn on the lights when you go into a dark area. Replace any light bulbs as soon as they burn out. Set up your furniture so you have a clear path. Avoid moving your furniture around. If any of your floors are uneven, fix them. If there are any pets around you, be aware of where they are. Review your medicines with your doctor. Some medicines can make you feel dizzy. This can increase your chance of falling. Ask your doctor what other things that you can do to help prevent falls. This information is not intended to replace advice given to you by your health care provider. Make sure you discuss any questions you have with your health care provider. Document Released: 02/27/2009 Document Revised: 10/09/2015 Document Reviewed: 06/07/2014 Elsevier Interactive Patient Education  2017 [employer].

## 2021-07-08 ENCOUNTER — Ambulatory Visit (INDEPENDENT_AMBULATORY_CARE_PROVIDER_SITE_OTHER): Payer: Medicare HMO | Admitting: Family Medicine

## 2021-07-08 ENCOUNTER — Encounter: Payer: Self-pay | Admitting: Family Medicine

## 2021-07-08 ENCOUNTER — Other Ambulatory Visit: Payer: Self-pay

## 2021-07-08 VITALS — BP 111/66 | HR 73 | Temp 98.2°F | Ht 63.39 in | Wt 186.6 lb

## 2021-07-08 DIAGNOSIS — I10 Essential (primary) hypertension: Secondary | ICD-10-CM | POA: Diagnosis not present

## 2021-07-08 DIAGNOSIS — E039 Hypothyroidism, unspecified: Secondary | ICD-10-CM

## 2021-07-08 DIAGNOSIS — R7309 Other abnormal glucose: Secondary | ICD-10-CM

## 2021-07-08 DIAGNOSIS — M810 Age-related osteoporosis without current pathological fracture: Secondary | ICD-10-CM | POA: Diagnosis not present

## 2021-07-08 DIAGNOSIS — E034 Atrophy of thyroid (acquired): Secondary | ICD-10-CM | POA: Diagnosis not present

## 2021-07-08 DIAGNOSIS — E669 Obesity, unspecified: Secondary | ICD-10-CM

## 2021-07-08 DIAGNOSIS — E782 Mixed hyperlipidemia: Secondary | ICD-10-CM | POA: Diagnosis not present

## 2021-07-08 DIAGNOSIS — E559 Vitamin D deficiency, unspecified: Secondary | ICD-10-CM

## 2021-07-08 DIAGNOSIS — G479 Sleep disorder, unspecified: Secondary | ICD-10-CM

## 2021-07-08 LAB — POCT GLYCOSYLATED HEMOGLOBIN (HGB A1C)
HbA1c POC (<> result, manual entry): 5.4 % (ref 4.0–5.6)
HbA1c, POC (controlled diabetic range): 5.4 % (ref 0.0–7.0)
HbA1c, POC (prediabetic range): 5.4 % — AB (ref 5.7–6.4)
Hemoglobin A1C: 5.4 % (ref 4.0–5.6)

## 2021-07-08 MED ORDER — AMLODIPINE BESYLATE 10 MG PO TABS: 10.0000 mg | ORAL_TABLET | Freq: Every day | ORAL | 1 refills | Status: DC

## 2021-07-08 MED ORDER — VITAMIN D (ERGOCALCIFEROL) 1.25 MG (50000 UNIT) PO CAPS: 50000.0000 [IU] | ORAL_CAPSULE | ORAL | 4 refills | Status: DC

## 2021-07-08 MED ORDER — ALENDRONATE SODIUM 70 MG PO TABS
70.0000 mg | ORAL_TABLET | ORAL | 11 refills | Status: DC
Start: 2021-07-08 — End: 2022-02-11

## 2021-07-08 MED ORDER — TRAZODONE HCL 50 MG PO TABS: 25.0000 mg | ORAL_TABLET | Freq: Every evening | ORAL | 1 refills | Status: DC | PRN

## 2021-07-08 MED ORDER — SYNTHROID 100 MCG PO TABS: 100.0000 ug | ORAL_TABLET | Freq: Every day | ORAL | 1 refills | Status: DC

## 2021-07-08 NOTE — Progress Notes (Signed)
This visit occurred during the SARS-CoV-2 public health emergency.  Safety protocols were in place, including screening questions prior to the visit, additional usage of staff PPE, and extensive cleaning of exam room while observing appropriate contact time as indicated for disinfecting solutions.    Patient ID: Alexis Beard, female  DOB: 11-Aug-1945, 76 y.o.   MRN: [id number] Patient Care Team    Relationship Specialty Notifications Start End  Ma Hillock, DO PCP - General Family Medicine  12/20/16   Rolm Bookbinder, MD Consulting Physician Dermatology  12/22/16   Elayne Snare, MD Consulting Physician Endocrinology  12/22/16     Chief Complaint  Patient presents with   Sleep Disorder    [facility]   Hypertension    Subjective: Alexis Beard is a 76 y.o.  Female  present for [facility] Hypertension/morbid obesity: Pt reports compliance with amlodipine 10 mg daily a day.  Patient denies chest pain, shortness of breath, dizziness or lower extremity edema.  Pt does not take a daily baby ASA. Pt is not  prescribed statin. Diet: Low-sodium diet  Exercise: Not routinely RF: Hypertension, family history present, obesity   Acquired hypothyroidism tsh normal 01/2021. Patient reports compliance  with Synthroid (name brand) 100 mcg daily on an empty stomach.  No complaints   Sleep disturbance: Patient reports compliance  with  trazodone and sleep pattern is controlled with medication.  She typically takes a half a tab/25 mg nightly but occasionally will take a whole tablet 50 mg nightly.   Osteoporosis: Patient reports compliance with Fosamax 70 mg q. 7 days.  She is compliant with high-dose 50,000 units ergocalciferol every 14 days. She is due for her rpt dexa   Depression screen [facility] 2/9 05/06/2021 01/27/2021 04/30/2020 02/19/2020 12/27/2019  Decreased Interest 0 2 0 0 0  Down, Depressed, Hopeless 0 1 0 0 0  PHQ - 2 Score 0 3 0 0 0  Altered sleeping - 1 - - -  Tired, decreased energy - 1 - - -  Change in  appetite - 1 - - -  Feeling bad or failure about yourself  - 0 - - -  Trouble concentrating - 0 - - -  Moving slowly or fidgety/restless - 0 - - -  Suicidal thoughts - 0 - - -  PHQ-9 Score - 6 - - -  Difficult doing work/chores - - - - -   GAD 7 : Generalized Anxiety Score 01/27/2021  Nervous, Anxious, on Edge 0  Control/stop worrying 0  Worry too much - different things 1  Trouble relaxing 1  Restless 1  Easily annoyed or irritable 0  Afraid - awful might happen 0  Total GAD 7 Score 3    Immunization History  Administered Date(s) Administered   Influenza, High Dose Seasonal PF 04/11/2018   Pneumococcal Conjugate-13 08/25/2015   Pneumococcal Polysaccharide-23 02/19/2020   Tdap 08/27/2012   Past Medical History:  Diagnosis Date   Anemia    Arthritis    Basal cell carcinoma (BCC)    Basal cell Carcinoma skin CA, on foot   Cataract    Colon polyps    COVID-19 01/29/2021   Diverticulosis    GERD (gastroesophageal reflux disease)    OCC   Hypertension    Hypothyroid    Migraines    Osteoporosis    Allergies  Allergen Reactions   Codeine Nausea And Vomiting   Past Surgical History:  Procedure Laterality Date   BASAL CELL CARCINOMA EXCISION  09/2015  Back   birth mark removal  1998   on (R) side of neck   CARPAL TUNNEL RELEASE  1998   COLONOSCOPY     TONSILLECTOMY  1956   Family History  Problem Relation Age of Onset   Diabetes Mother    Thyroid disease Mother    Heart disease Mother 40   Hyperlipidemia Father    Heart disease Father 32   Hyperlipidemia Sister    Cancer Maternal Aunt        Breast Cancer   Thyroid disease Maternal Aunt    Melanoma Maternal Aunt    Colon cancer Neg Hx    Esophageal cancer Neg Hx    Rectal cancer Neg Hx    Stomach cancer Neg Hx    Social History   Social History Narrative   Married to [place], has children.   High school graduate.   Drinks caffeine.   Wears her seatbelt, wears a bicycle helmet.   Wears glasses.    Smoke detector in the home, firearms locked in the home.   Feels safe in her relationships.   Right handed.    Allergies as of 07/08/2021       Reactions   Codeine Nausea And Vomiting        Medication List        Accurate as of July 08, 2021 10:42 AM. If you have any questions, ask your nurse or doctor.          STOP taking these medications    levothyroxine 100 MCG tablet Commonly known as: Synthroid Replaced by: Synthroid 100 MCG tablet Stopped by: Howard Pouch, DO       TAKE these medications    alendronate 70 MG tablet Commonly known as: FOSAMAX Take 1 tablet (70 mg total) by mouth every 7 (seven) days. Take with a full glass of water on an empty stomach.   amLODipine 10 MG tablet Commonly known as: NORVASC Take 1 tablet (10 mg total) by mouth daily.   feeding supplement Liqd Take 1 Container by mouth every other day.   MIRALAX PO Take by mouth. Takes weekly   Synthroid 100 MCG tablet Generic drug: levothyroxine Take 1 tablet (100 mcg total) by mouth daily before breakfast. Replaces: levothyroxine 100 MCG tablet Started by: Howard Pouch, DO   traZODone 50 MG tablet Commonly known as: DESYREL Take 0.5-1 tablets (25-50 mg total) by mouth at bedtime as needed for sleep.   Vitamin D (Ergocalciferol) 1.25 MG (50000 UNIT) Caps capsule Commonly known as: DRISDOL Take 1 capsule (50,000 Units total) by mouth every 14 (fourteen) days.        All past medical history, surgical history, allergies, family history, immunizations andmedications were updated in the EMR today and reviewed under the history and medication portions of their EMR.     ROS: 14 pt review of systems performed and negative (unless mentioned in an HPI)  Objective: BP 111/66    Pulse 73    Temp 98.2 F (36.8 C) (Oral)    Ht 5' 3.39" (1.61 m)    Wt 186 lb 9.6 oz (84.6 kg)    SpO2 97%    BMI 32.65 kg/m  Physical Exam Vitals and nursing note reviewed.  Constitutional:       General: She is not in acute distress.    Appearance: Normal appearance. She is not ill-appearing, toxic-appearing or diaphoretic.  HENT:     Head: Normocephalic and atraumatic.  Eyes:     General: No  scleral icterus.       Right eye: No discharge.        Left eye: No discharge.     Extraocular Movements: Extraocular movements intact.     Conjunctiva/sclera: Conjunctivae normal.     Pupils: Pupils are equal, round, and reactive to light.  Cardiovascular:     Rate and Rhythm: Normal rate and regular rhythm.     Heart sounds: No murmur heard.   No friction rub. No gallop.  Pulmonary:     Effort: Pulmonary effort is normal. No respiratory distress.     Breath sounds: Normal breath sounds. No wheezing, rhonchi or rales.  Musculoskeletal:     Cervical back: Neck supple. No tenderness.     Right lower leg: No edema.     Left lower leg: No edema.  Lymphadenopathy:     Cervical: No cervical adenopathy.  Skin:    General: Skin is warm and dry.     Coloration: Skin is not jaundiced or pale.     Findings: No erythema or rash.  Neurological:     Mental Status: She is alert and oriented to person, place, and time. Mental status is at baseline.     Motor: No weakness.     Gait: Gait normal.  Psychiatric:        Mood and Affect: Mood normal.        Behavior: Behavior normal.        Thought Content: Thought content normal.        Judgment: Judgment normal.    No results found.  Assessment/plan: AVINA EBERLE is a 76 y.o. female present for [place] Essential hypertension/HLD/morbid obesity Stable.  - Low-sodium, exercise.  Continue  amlodipine 10 mg daily. - labs due next visit.   Acquired hypothyroidism Stable.   Continue Synthroid 100 mcg daily,- labs UTD 01/2021  Insomnia: Stable. Continue  trazodone 25-50 mg nightly  Vitamin D deficiency/Osteoporosis without current pathological fracture, unspecified osteoporosis type - DG Bone Density placed 01/27/2021 - she has not  scheduled yet -continue Fosamax 70 mg once weekly  Elevated hemoglobin A1c - Hemoglobin A1c> 6.0> 5.4 today  Return in about 7 months (around 01/28/2022) for CPE (30 min), [facility] (30 min).   Orders Placed This Encounter  Procedures   POCT glycosylated hemoglobin (Hb A1C)   Meds ordered this encounter  Medications   amLODipine (NORVASC) 10 MG tablet    Sig: Take 1 tablet (10 mg total) by mouth daily.    Dispense:  90 tablet    Refill:  1   Vitamin D, Ergocalciferol, (DRISDOL) 1.25 MG (50000 UNIT) CAPS capsule    Sig: Take 1 capsule (50,000 Units total) by mouth every 14 (fourteen) days.    Dispense:  6 capsule    Refill:  4   SYNTHROID 100 MCG tablet    Sig: Take 1 tablet (100 mcg total) by mouth daily before breakfast.    Dispense:  90 tablet    Refill:  1    Name brand- add to existing refills. thanks   alendronate (FOSAMAX) 70 MG tablet    Sig: Take 1 tablet (70 mg total) by mouth every 7 (seven) days. Take with a full glass of water on an empty stomach.    Dispense:  4 tablet    Refill:  11   traZODone (DESYREL) 50 MG tablet    Sig: Take 0.5-1 tablets (25-50 mg total) by mouth at bedtime as needed for sleep.    Dispense:  45 tablet    Refill:  1   Referral Orders  No referral(s) requested today    Electronically signed by: Howard Pouch, [place]

## 2021-07-08 NOTE — Patient Instructions (Addendum)
°  Next appt in September- schedule as your physical. We will collect your fasting labs that day and perform your physical and cover chronic conditions (combination appt)

## 2021-07-14 ENCOUNTER — Ambulatory Visit: Payer: Medicare HMO | Admitting: Family Medicine

## 2021-09-02 DIAGNOSIS — D225 Melanocytic nevi of trunk: Secondary | ICD-10-CM | POA: Diagnosis not present

## 2021-09-02 DIAGNOSIS — D2272 Melanocytic nevi of left lower limb, including hip: Secondary | ICD-10-CM | POA: Diagnosis not present

## 2021-09-02 DIAGNOSIS — D2271 Melanocytic nevi of right lower limb, including hip: Secondary | ICD-10-CM | POA: Diagnosis not present

## 2021-09-02 DIAGNOSIS — L821 Other seborrheic keratosis: Secondary | ICD-10-CM | POA: Diagnosis not present

## 2021-09-02 DIAGNOSIS — R208 Other disturbances of skin sensation: Secondary | ICD-10-CM | POA: Diagnosis not present

## 2021-09-02 DIAGNOSIS — L814 Other melanin hyperpigmentation: Secondary | ICD-10-CM | POA: Diagnosis not present

## 2021-09-02 DIAGNOSIS — L820 Inflamed seborrheic keratosis: Secondary | ICD-10-CM | POA: Diagnosis not present

## 2021-10-14 DIAGNOSIS — Z0100 Encounter for examination of eyes and vision without abnormal findings: Secondary | ICD-10-CM | POA: Diagnosis not present

## 2021-10-14 DIAGNOSIS — H5210 Myopia, unspecified eye: Secondary | ICD-10-CM | POA: Diagnosis not present

## 2021-10-14 DIAGNOSIS — E7800 Pure hypercholesterolemia, unspecified: Secondary | ICD-10-CM | POA: Diagnosis not present

## 2022-01-13 ENCOUNTER — Other Ambulatory Visit: Payer: Self-pay | Admitting: Family Medicine

## 2022-01-28 ENCOUNTER — Encounter: Payer: Medicare HMO | Admitting: Family Medicine

## 2022-02-03 ENCOUNTER — Encounter: Payer: Medicare HMO | Admitting: Family Medicine

## 2022-02-05 ENCOUNTER — Encounter: Payer: Medicare HMO | Admitting: Family Medicine

## 2022-02-06 ENCOUNTER — Other Ambulatory Visit: Payer: Self-pay | Admitting: Family Medicine

## 2022-02-11 ENCOUNTER — Encounter: Payer: Self-pay | Admitting: Family Medicine

## 2022-02-11 ENCOUNTER — Ambulatory Visit (INDEPENDENT_AMBULATORY_CARE_PROVIDER_SITE_OTHER): Payer: Medicare HMO | Admitting: Family Medicine

## 2022-02-11 VITALS — BP 129/77 | HR 70 | Temp 98.3°F | Ht 63.5 in | Wt 186.0 lb

## 2022-02-11 DIAGNOSIS — R7309 Other abnormal glucose: Secondary | ICD-10-CM | POA: Diagnosis not present

## 2022-02-11 DIAGNOSIS — Z0000 Encounter for general adult medical examination without abnormal findings: Secondary | ICD-10-CM

## 2022-02-11 DIAGNOSIS — Z1231 Encounter for screening mammogram for malignant neoplasm of breast: Secondary | ICD-10-CM | POA: Diagnosis not present

## 2022-02-11 DIAGNOSIS — E559 Vitamin D deficiency, unspecified: Secondary | ICD-10-CM | POA: Diagnosis not present

## 2022-02-11 DIAGNOSIS — E034 Atrophy of thyroid (acquired): Secondary | ICD-10-CM | POA: Diagnosis not present

## 2022-02-11 DIAGNOSIS — E669 Obesity, unspecified: Secondary | ICD-10-CM

## 2022-02-11 DIAGNOSIS — M810 Age-related osteoporosis without current pathological fracture: Secondary | ICD-10-CM

## 2022-02-11 DIAGNOSIS — G479 Sleep disorder, unspecified: Secondary | ICD-10-CM

## 2022-02-11 DIAGNOSIS — E782 Mixed hyperlipidemia: Secondary | ICD-10-CM

## 2022-02-11 DIAGNOSIS — I10 Essential (primary) hypertension: Secondary | ICD-10-CM | POA: Diagnosis not present

## 2022-02-11 LAB — LIPID PANEL
Cholesterol: 202 mg/dL — ABNORMAL HIGH (ref 0–200)
HDL: 65.3 mg/dL (ref >39.00)
LDL Cholesterol: 110 mg/dL — ABNORMAL HIGH (ref 0–99)
NonHDL: 136.46
Total CHOL/HDL Ratio: 3
Triglycerides: 131 mg/dL (ref 0.0–149.0)
VLDL: 26.2 mg/dL (ref 0.0–40.0)

## 2022-02-11 LAB — COMPREHENSIVE METABOLIC PANEL
ALT: 11 U/L (ref 0–35)
AST: 12 U/L (ref 0–37)
Albumin: 4.1 g/dL (ref 3.5–5.2)
Alkaline Phosphatase: 62 U/L (ref 39–117)
BUN: 11 mg/dL (ref 6–23)
CO2: 29 mEq/L (ref 19–32)
Calcium: 9 mg/dL (ref 8.4–10.5)
Chloride: 104 mEq/L (ref 96–112)
Creatinine, Ser: 0.77 mg/dL (ref 0.40–1.20)
GFR: 75.14 mL/min (ref >60.00)
Glucose, Bld: 96 mg/dL (ref 70–99)
Potassium: 4.2 mEq/L (ref 3.5–5.1)
Sodium: 139 mEq/L (ref 135–145)
Total Bilirubin: 0.6 mg/dL (ref 0.2–1.2)
Total Protein: 6.8 g/dL (ref 6.0–8.3)

## 2022-02-11 LAB — CBC
HCT: 40 % (ref 36.0–46.0)
Hemoglobin: 13.1 g/dL (ref 12.0–15.0)
MCHC: 32.7 g/dL (ref 30.0–36.0)
MCV: 90.4 fl (ref 78.0–100.0)
Platelets: 285 10*3/uL (ref 150.0–400.0)
RBC: 4.43 Mil/uL (ref 3.87–5.11)
RDW: 13.9 % (ref 11.5–15.5)
WBC: 6 10*3/uL (ref 4.0–10.5)

## 2022-02-11 LAB — HEMOGLOBIN A1C: Hgb A1c MFr Bld: 5.9 % (ref 4.6–6.5)

## 2022-02-11 LAB — TSH: TSH: 0.76 u[IU]/mL (ref 0.35–5.50)

## 2022-02-11 LAB — T4, FREE: Free T4: 1.13 ng/dL (ref 0.60–1.60)

## 2022-02-11 LAB — VITAMIN D 25 HYDROXY (VIT D DEFICIENCY, FRACTURES): VITD: 34.02 ng/mL (ref 30.00–100.00)

## 2022-02-11 MED ORDER — ALENDRONATE SODIUM 70 MG PO TABS: 70.0000 mg | ORAL_TABLET | ORAL | 11 refills | Status: DC

## 2022-02-11 MED ORDER — AMLODIPINE BESYLATE 10 MG PO TABS: 10.0000 mg | ORAL_TABLET | Freq: Every day | ORAL | 1 refills | Status: DC

## 2022-02-11 MED ORDER — TRAZODONE HCL 50 MG PO TABS: 25.0000 mg | ORAL_TABLET | Freq: Every evening | ORAL | 1 refills | Status: DC | PRN

## 2022-02-11 MED ORDER — VITAMIN D (ERGOCALCIFEROL) 1.25 MG (50000 UNIT) PO CAPS: 50000.0000 [IU] | ORAL_CAPSULE | ORAL | 4 refills | Status: DC

## 2022-02-11 NOTE — Progress Notes (Signed)
Patient ID: Alexis Beard, female  DOB: 07/02/1945, 76 y.o.   MRN: [id number] Patient Care Team    Relationship Specialty Notifications Start End  Ma Hillock, DO PCP - General Family Medicine  12/20/16   Rolm Bookbinder, MD Consulting Physician Dermatology  12/22/16   Elayne Snare, MD Consulting Physician Endocrinology  12/22/16     Chief Complaint  Patient presents with   Annual Exam    [facility]; pt is fasting    Subjective: Alexis Beard is a 76 y.o.  Female  present for CPE/CMC. All past medical history, surgical history, allergies, family history, immunizations, medications and social history were updated in the electronic medical record today. All recent labs, ED visits and hospitalizations within the last year were reviewed.  Health maintenance:  Colonoscopy: > 75 not indicated Mammogram: completed 01/2019, [facility] ordered today. Immunizations: tdap up-to-date 2014, Influenza up-to-date 2019- declined today, only low-dose shall be administered (encouraged yearly), PNA series completed, Shingrix declined, covid vac declined > covid 9 days ago.  Infectious disease screening: Hep C screening completed today DEXA: last completed 01/2019 T score -4.2 osteoporosis.>>ordered again [facility] st Assistive device: none Oxygen INO:MVEH Patient has a Dental home. Hospitalizations/ED visits: reviewed  Hypertension/morbid obesity: Pt reports compliance with amlodipine 10 mg daily a day.  Patient denies chest pain, shortness of breath, dizziness or lower extremity edema.   Pt does not take a daily baby ASA. Pt is not  prescribed statin. Diet: Low-sodium diet  Exercise: Not routinely RF: Hypertension, family history present, obesity   Acquired hypothyroidism tsh normal 01/2021. Patient reports compliance with Synthroid (name brand) 100 mcg daily on an empty stomach.  No complaints   Sleep disturbance: Patient reports compliance  with  trazodone and sleep pattern is controlled with  medication.  She typically takes a half a tab/25 mg nightly but occasionally will take a whole tablet 50 mg nightly.   Osteoporosis: Patient reports compliance with Fosamax 70 mg q. 7 days.  She is compliant with high-dose 50,000 units ergocalciferol every 14 days. Dexa due     02/11/2022    9:05 AM 05/06/2021    2:38 PM 01/27/2021   10:11 AM 04/30/2020    3:09 PM 02/19/2020   10:44 AM  Depression screen PHQ 2/9  Decreased Interest 0 0 2 0 0  Down, Depressed, Hopeless 0 0 1 0 0  PHQ - 2 Score 0 0 3 0 0  Altered sleeping   1    Tired, decreased energy   1    Change in appetite   1    Feeling bad or failure about yourself    0    Trouble concentrating   0    Moving slowly or fidgety/restless   0    Suicidal thoughts   0    PHQ-9 Score   6        01/27/2021   10:12 AM  GAD 7 : Generalized Anxiety Score  Nervous, Anxious, on Edge 0  Control/stop worrying 0  Worry too much - different things 1  Trouble relaxing 1  Restless 1  Easily annoyed or irritable 0  Afraid - awful might happen 0  Total GAD 7 Score 3   Immunization History  Administered Date(s) Administered   Influenza, High Dose Seasonal PF 04/11/2018   Pneumococcal Conjugate-13 08/25/2015   Pneumococcal Polysaccharide-23 02/19/2020   Tdap 08/27/2012   Past Medical History:  Diagnosis Date   Anemia  Arthritis    Basal cell carcinoma (BCC)    Basal cell Carcinoma skin CA, on foot   Cataract    Colon polyps    COVID-19 01/29/2021   Diverticulosis    GERD (gastroesophageal reflux disease)    OCC   Hypertension    Hypothyroid    Migraines    Osteoporosis    Allergies  Allergen Reactions   Codeine Nausea And Vomiting   Past Surgical History:  Procedure Laterality Date   BASAL CELL CARCINOMA EXCISION  09/2015   Back   birth mark removal  1998   on (R) side of neck   CARPAL [place]   Family History  Problem Relation Age of Onset   Diabetes Mother     Thyroid disease Mother    Heart disease Mother 42   Hyperlipidemia Father    Heart disease Father 40   Hyperlipidemia Sister    Cancer Maternal Aunt        Breast Cancer   Thyroid disease Maternal Aunt    Melanoma Maternal Aunt    Colon cancer Neg Hx    Esophageal cancer Neg Hx    Rectal cancer Neg Hx    Stomach cancer Neg Hx    Social History   Social History Narrative   Married to [place], has children.   High school graduate.   Drinks caffeine.   Wears her seatbelt, wears a bicycle helmet.   Wears glasses.   Smoke detector in the home, firearms locked in the home.   Feels safe in her relationships.   Right handed.    Allergies as of 02/11/2022       Reactions   Codeine Nausea And Vomiting        Medication List        Accurate as of February 11, 2022  9:25 AM. If you have any questions, ask your nurse or doctor.          STOP taking these medications    amLODipine 10 MG tablet Commonly known as: NORVASC Stopped by: Howard Pouch, DO       TAKE these medications    alendronate 70 MG tablet Commonly known as: FOSAMAX Take 1 tablet (70 mg total) by mouth every 7 (seven) days. Take with a full glass of water on an empty stomach.   feeding supplement Liqd Take 1 Container by mouth every other day.   MIRALAX PO Take by mouth. Takes weekly   Synthroid 100 MCG tablet Generic drug: levothyroxine Take 1 tablet (100 mcg total) by mouth daily before breakfast.   traZODone 50 MG tablet Commonly known as: DESYREL Take 0.5-1 tablets (25-50 mg total) by mouth at bedtime as needed for sleep.   Vitamin D (Ergocalciferol) 1.25 MG (50000 UNIT) Caps capsule Commonly known as: DRISDOL Take 1 capsule (50,000 Units total) by mouth every 14 (fourteen) days.       All past medical history, surgical history, allergies, family history, immunizations andmedications were updated in the EMR today and reviewed under the history and medication portions of their EMR.      No results found for this or any previous visit (from the past 2160 hour(s)).  CT HEAD WO CONTRAST (5MM) Result Date: 01/18/2021 IMPRESSION: Normal head CT without contrast for age Electronically Signed   By: Jerilynn Mages.  Shick M.D.   On: 01/18/2021 08:36  DG Chest Portable 1 View Result Date: 01/18/2021 IMPRESSION: No active  disease. Electronically Signed   By: Ilona Sorrel M.D.   On: 01/18/2021 05:46    ROS 14 pt review of systems performed and negative (unless mentioned in an HPI)  Objective: BP 129/77   Pulse 70   Temp 98.3 F (36.8 C) (Oral)   Ht 5' 3.5" (1.613 m)   Wt 186 lb (84.4 kg)   SpO2 97%   BMI 32.43 kg/m  Physical Exam Vitals and nursing note reviewed.  Constitutional:      General: She is not in acute distress.    Appearance: Normal appearance. She is not ill-appearing or toxic-appearing.  HENT:     Head: Normocephalic and atraumatic.     Right Ear: Tympanic membrane, ear canal and external ear normal. There is no impacted cerumen.     Left Ear: Tympanic membrane, ear canal and external ear normal. There is no impacted cerumen.     Nose: No congestion or rhinorrhea.     Mouth/Throat:     Mouth: Mucous membranes are moist.     Pharynx: Oropharynx is clear. No oropharyngeal exudate or posterior oropharyngeal erythema.  Eyes:     General: No scleral icterus.       Right eye: No discharge.        Left eye: No discharge.     Extraocular Movements: Extraocular movements intact.     Conjunctiva/sclera: Conjunctivae normal.     Pupils: Pupils are equal, round, and reactive to light.  Cardiovascular:     Rate and Rhythm: Normal rate and regular rhythm.     Pulses: Normal pulses.     Heart sounds: Normal heart sounds. No murmur heard.    No friction rub. No gallop.  Pulmonary:     Effort: Pulmonary effort is normal. No respiratory distress.     Breath sounds: Normal breath sounds. No stridor. No wheezing, rhonchi or rales.  Chest:     Chest wall: No tenderness.   Abdominal:     General: Abdomen is flat. Bowel sounds are normal. There is no distension.     Palpations: Abdomen is soft. There is no mass.     Tenderness: There is no abdominal tenderness. There is no right CVA tenderness, left CVA tenderness, guarding or rebound.     Hernia: No hernia is present.  Musculoskeletal:        General: No swelling, tenderness or deformity. Normal range of motion.     Cervical back: Normal range of motion and neck supple. No rigidity or tenderness.     Right lower leg: No edema.     Left lower leg: No edema.  Lymphadenopathy:     Cervical: No cervical adenopathy.  Skin:    General: Skin is warm and dry.     Coloration: Skin is not jaundiced or pale.     Findings: No bruising, erythema, lesion or rash.  Neurological:     General: No focal deficit present.     Mental Status: She is alert and oriented to person, place, and time. Mental status is at baseline.     Cranial Nerves: No cranial nerve deficit.     Sensory: No sensory deficit.     Motor: No weakness.     Coordination: Coordination normal.     Gait: Gait normal.     Deep Tendon Reflexes: Reflexes normal.  Psychiatric:        Mood and Affect: Mood normal.        Behavior: Behavior normal.  Thought Content: Thought content normal.        Judgment: Judgment normal.      No results found.  Assessment/plan: Alexis Beard is a 76 y.o. female present for [place] Hypothyroidism due to acquired atrophy of thyroid Continue synthroid 100 mcg- refills will be provided in appropriate dose based on lab result today - TSH - T4, free  Essential hypertension/Morbid obesity (HCC)/HLD No longer requiring meds. DC'd amlodipine - Comprehensive metabolic panel - CBC - Lipid panel  Sleep disturbance Stable Continue trazodone 25-50 m qhs prn - traZODone (DESYREL) 50 MG tablet; Take 0.5-1 tablets (25-50 mg total) by mouth at bedtime as needed for sleep.  Dispense: 45 tablet; Refill:  1  Osteoporosis without current pathological fracture, unspecified osteoporosis type/vit d def - VITAMIN D 25 Hydroxy (Vit-D Deficiency, Fractures) - Vitamin D, Ergocalciferol, (DRISDOL) 1.25 MG (50000 UNIT) CAPS capsule; Take 1 capsule (50,000 Units total) by mouth every 14 (fourteen) days.  Dispense: 6 capsule; Refill: 4 - DG Bone Density; Future Elevated hemoglobin A1c - Hemoglobin A1c  Breast cancer screening by mammogram - MM 3D SCREEN BREAST BILATERAL; Future Routine general medical examination at a health care facility Colonoscopy: > 75 not indicated Mammogram: completed 01/2019, [facility] ordered today. Immunizations: tdap up-to-date 2014, Influenza up-to-date 2019- declined today, only low-dose shall be administered (encouraged yearly), PNA series completed, Shingrix declined, covid vac declined > covid 9 days ago.  Infectious disease screening: Hep C screening completed today DEXA: last completed 01/2019 T score -4.2 osteoporosis.>>ordered again [facility] st Patient was encouraged to exercise greater than 150 minutes a week. Patient was encouraged to choose a diet filled with fresh fruits and vegetables, and lean meats. AVS provided to patient today for education/recommendation on gender specific health and safety maintenance.  Return in about 24 weeks (around 07/29/2022) for Routine chronic condition follow-up.   Orders Placed This Encounter  Procedures   DG Bone Density   MM 3D SCREEN BREAST BILATERAL   Comprehensive metabolic panel   CBC   Hemoglobin A1c   Lipid panel   TSH   VITAMIN D 25 Hydroxy (Vit-D Deficiency, Fractures)   T4, free   Meds ordered this encounter  Medications   alendronate (FOSAMAX) 70 MG tablet    Sig: Take 1 tablet (70 mg total) by mouth every 7 (seven) days. Take with a full glass of water on an empty stomach.    Dispense:  4 tablet    Refill:  11   Vitamin D, Ergocalciferol, (DRISDOL) 1.25 MG (50000 UNIT) CAPS capsule    Sig: Take 1  capsule (50,000 Units total) by mouth every 14 (fourteen) days.    Dispense:  6 capsule    Refill:  4   DISCONTD: amLODipine (NORVASC) 10 MG tablet    Sig: Take 1 tablet (10 mg total) by mouth daily.    Dispense:  90 tablet    Refill:  1   traZODone (DESYREL) 50 MG tablet    Sig: Take 0.5-1 tablets (25-50 mg total) by mouth at bedtime as needed for sleep.    Dispense:  45 tablet    Refill:  1   Referral Orders  No referral(s) requested today     Electronically signed by: Howard Pouch, [place]

## 2022-02-11 NOTE — Patient Instructions (Addendum)
Return in about 24 weeks (around 07/29/2022) for Routine chronic condition follow-up.        Great to see you today.  I have refilled the medication(s) we provide.   If labs were collected, we will inform you of lab results once received either by echart message or telephone call.   - echart message- for normal results that have been seen by the patient already.   - telephone call: abnormal results or if patient has not viewed results in their echart. Health Maintenance, Female Adopting a healthy lifestyle and getting preventive care are important in promoting health and wellness. Ask your health care provider about: The right schedule for you to have regular tests and exams. Things you can do on your own to prevent diseases and keep yourself healthy. What should I know about diet, weight, and exercise? Eat a healthy diet  Eat a diet that includes plenty of vegetables, fruits, low-fat dairy products, and lean protein. Do not eat a lot of foods that are high in solid fats, added sugars, or sodium. Maintain a healthy weight Body mass index (BMI) is used to identify weight problems. It estimates body fat based on height and weight. Your health care provider can help determine your BMI and help you achieve or maintain a healthy weight. Get regular exercise Get regular exercise. This is one of the most important things you can do for your health. Most adults should: Exercise for at least 150 minutes each week. The exercise should increase your heart rate and make you sweat (moderate-intensity exercise). Do strengthening exercises at least twice a week. This is in addition to the moderate-intensity exercise. Spend less time sitting. Even light physical activity can be beneficial. Watch cholesterol and blood lipids Have your blood tested for lipids and cholesterol at 76 years of age, then have this test every 5 years. Have your cholesterol levels checked more often if: Your lipid or  cholesterol levels are high. You are older than 76 years of age. You are at high risk for heart disease. What should I know about cancer screening? Depending on your health history and family history, you may need to have cancer screening at various ages. This may include screening for: Breast cancer. Cervical cancer. Colorectal cancer. Skin cancer. Lung cancer. What should I know about heart disease, diabetes, and high blood pressure? Blood pressure and heart disease High blood pressure causes heart disease and increases the risk of stroke. This is more likely to develop in people who have high blood pressure readings or are overweight. Have your blood pressure checked: Every 3-5 years if you are 67-44 years of age. Every year if you are 65 years old or older. Diabetes Have regular diabetes screenings. This checks your fasting blood sugar level. Have the screening done: Once every three years after age 19 if you are at a normal weight and have a low risk for diabetes. More often and at a younger age if you are overweight or have a high risk for diabetes. What should I know about preventing infection? Hepatitis B If you have a higher risk for hepatitis B, you should be screened for this virus. Talk with your health care provider to find out if you are at risk for hepatitis B infection. Hepatitis C Testing is recommended for: Everyone born from [birth year] through 1965. Anyone with known risk factors for hepatitis C. Sexually transmitted infections (STIs) Get screened for STIs, including gonorrhea and chlamydia, if: You are sexually active and are younger  than 76 years of age. You are older than 76 years of age and your health care provider tells you that you are at risk for this type of infection. Your sexual activity has changed since you were last screened, and you are at increased risk for chlamydia or gonorrhea. Ask your health care provider if you are at risk. Ask your health care  provider about whether you are at high risk for HIV. Your health care provider may recommend a prescription medicine to help prevent HIV infection. If you choose to take medicine to prevent HIV, you should first get tested for HIV. You should then be tested every 3 months for as long as you are taking the medicine. Pregnancy If you are about to stop having your period (premenopausal) and you may become pregnant, seek counseling before you get pregnant. Take 400 to 800 micrograms (mcg) of folic acid every day if you become pregnant. Ask for birth control (contraception) if you want to prevent pregnancy. Osteoporosis and menopause Osteoporosis is a disease in which the bones lose minerals and strength with aging. This can result in bone fractures. If you are 27 years old or older, or if you are at risk for osteoporosis and fractures, ask your health care provider if you should: Be screened for bone loss. Take a calcium or vitamin D supplement to lower your risk of fractures. Be given hormone replacement therapy (HRT) to treat symptoms of menopause. Follow these instructions at home: Alcohol use Do not drink alcohol if: Your health care provider tells you not to drink. You are pregnant, may be pregnant, or are planning to become pregnant. If you drink alcohol: Limit how much you have to: 0-1 drink a day. Know how much alcohol is in your drink. In the [place] one drink equals one 12 oz bottle of beer (355 mL), one 5 oz glass of wine (148 mL), or one 1 oz glass of hard liquor (44 mL). Lifestyle Do not use any products that contain nicotine or tobacco. These products include cigarettes, chewing tobacco, and vaping devices, such as e-cigarettes. If you need help quitting, ask your health care provider. Do not use street drugs. Do not share needles. Ask your health care provider for help if you need support or information about quitting drugs. General instructions Schedule regular health, dental, and  eye exams. Stay current with your vaccines. Tell your health care provider if: You often feel depressed. You have ever been abused or do not feel safe at home. Summary Adopting a healthy lifestyle and getting preventive care are important in promoting health and wellness. Follow your health care provider's instructions about healthy diet, exercising, and getting tested or screened for diseases. Follow your health care provider's instructions on monitoring your cholesterol and blood pressure. This information is not intended to replace advice given to you by your health care provider. Make sure you discuss any questions you have with your health care provider. Document Revised: 09/22/2020 Document Reviewed: 09/22/2020 Elsevier Patient Education  [place].

## 2022-02-12 ENCOUNTER — Telehealth: Payer: Self-pay | Admitting: Family Medicine

## 2022-02-12 DIAGNOSIS — E039 Hypothyroidism, unspecified: Secondary | ICD-10-CM

## 2022-02-12 MED ORDER — SYNTHROID 100 MCG PO TABS: 100.0000 ug | ORAL_TABLET | Freq: Every day | ORAL | 3 refills | Status: DC

## 2022-02-12 NOTE — Telephone Encounter (Signed)
Spoke with patient regarding results/recommendations.  

## 2022-02-12 NOTE — Telephone Encounter (Signed)
Please inform patient: Her liver, kidney and thyroid functions are normal.  I have refilled her thyroid medication today. Her cholesterol levels are good and at goal for her with an LDL/bad cholesterol of 110. Her vitamin D levels are in low normal range at 34.02.  Would encourage her to make sure she is taking her vitamin D supplement every 14 days.  I did provide refills for her. Her blood cell counts are normal Her A1c/diabetes screening did increase some from 5.4-5.9, this is still in normal range.

## 2022-05-12 ENCOUNTER — Ambulatory Visit (INDEPENDENT_AMBULATORY_CARE_PROVIDER_SITE_OTHER): Payer: Medicare HMO

## 2022-05-12 DIAGNOSIS — Z0000 Encounter for general adult medical examination without abnormal findings: Secondary | ICD-10-CM

## 2022-05-12 NOTE — Progress Notes (Signed)
Subjective:   Alexis Beard is a 76 y.o. female who presents for Medicare Annual (Subsequent) preventive examination.  Review of Systems    Defer to PCP Cardiac Risk Factors include: advanced age (>29mn, >>63women);hypertension;sedentary lifestyle;dyslipidemia     Objective:    There were no vitals filed for this visit. There is no height or weight on file to calculate BMI.     05/12/2022    1:49 PM 05/06/2021    2:39 PM 01/18/2021    3:44 AM 04/30/2020    3:04 PM  Advanced Directives  Does Patient Have a Medical Advance Directive? Yes Yes No No  Type of [place]amed[place]of [place] will Living will    Copy of [place] Chart? No - copy requested     Would patient like information on creating a medical advance directive?   No - Patient declined Yes (MAU/Ambulatory/Procedural Areas - Information given)    Current Medications (verified) Outpatient Encounter Medications as of 05/12/2022  Medication Sig   alendronate (FOSAMAX) 70 MG tablet Take 1 tablet (70 mg total) by mouth every 7 (seven) days. Take with a full glass of water on an empty stomach.   feeding supplement (BOOST HIGH PROTEIN) LIQD Take 1 Container by mouth every other day.   Polyethylene Glycol 3350 (MIRALAX PO) Take by mouth. Takes weekly   SYNTHROID 100 MCG tablet Take 1 tablet (100 mcg total) by mouth daily before breakfast.   traZODone (DESYREL) 50 MG tablet Take 0.5-1 tablets (25-50 mg total) by mouth at bedtime as needed for sleep.   Vitamin D, Ergocalciferol, (DRISDOL) 1.25 MG (50000 UNIT) CAPS capsule Take 1 capsule (50,000 Units total) by mouth every 14 (fourteen) days.   No facility-administered encounter medications on file as of 05/12/2022.    Allergies (verified) Codeine   History: Past Medical History:  Diagnosis Date   Anemia    Arthritis    Basal cell carcinoma (BCC)    Basal cell Carcinoma skin CA, on foot   Cataract    Colon polyps     COVID-19 01/29/2021   Diverticulosis    GERD (gastroesophageal reflux disease)    OCC   Hypertension    Hypothyroid    Migraines    Osteoporosis    Past Surgical History:  Procedure Laterality Date   BASAL CELL CARCINOMA EXCISION  09/2015   Back   birth mark removal  1998   on (R) side of neck   [place]  Family History  Problem Relation Age of Onset   Diabetes Mother    Thyroid disease Mother    Heart disease Mother 656  Hyperlipidemia Father    Heart disease Father 974  Hyperlipidemia Sister    Cancer Maternal Aunt        Breast Cancer   Thyroid disease Maternal Aunt    Melanoma Maternal Aunt    Colon cancer Neg Hx    Esophageal cancer Neg Hx    Rectal cancer Neg Hx    Stomach cancer Neg Hx    Social History   Socioeconomic History   Marital status: Married    Spouse name: JJeneen Rinks  Number of children: 3   Years of education: 12   Highest education level: Not on file  Occupational History   Occupation: Controller  Tobacco Use   Smoking status: Never   Smokeless tobacco: Never  Vaping Use   Vaping Use: Never used  Substance and Sexual Activity   Alcohol use: No   Drug use: No   Sexual activity: Never    Partners: Male    Birth control/protection: Condom  Other Topics Concern   Not on file  Social History Narrative   Married to [place], has children.   High school graduate.   Drinks caffeine.   Wears her seatbelt, wears a bicycle helmet.   Wears glasses.   Smoke detector in the home, firearms locked in the home.   Feels safe in her relationships.   Right handed.   Social Determinants of Health   Financial Resource Strain: Low Risk  (05/06/2021)   Overall Financial Resource Strain (CARDIA)    Difficulty of Paying Living Expenses: Not hard at all  Food Insecurity: No Food Insecurity (05/06/2021)   Hunger Vital Sign    Worried About Running Out of Food in the Last Year: Never true    Ran Out  of Food in the Last Year: Never true  Transportation Needs: No Transportation Needs (05/06/2021)   PRAPARE - Hydrologist (Medical): No    Lack of Transportation (Non-Medical): No  Physical Activity: Inactive (05/06/2021)   Exercise Vital Sign    Days of Exercise per Week: 0 days    Minutes of Exercise per Session: 0 min  Stress: No Stress Concern Present (05/06/2021)   Chloride    Feeling of Stress : Not at all  Social Connections: [place] (05/06/2021)   Social Connection and Isolation Panel [NHANES]    Frequency of Communication with Friends and Family: More than three times a week    Frequency of Social Gatherings with Friends and Family: More than three times a week    Attends Religious Services: More than 4 times per year    Active Member of [employer] or Organizations: Yes    Attends Archivist Meetings: 1 to 4 times per year    Marital Status: Married    Tobacco Counseling Counseling given: Not Answered   Clinical Intake:  Pre-visit preparation completed: Yes  Pain : No/denies pain     Nutritional Risks: None Diabetes: No  How often do you need to have someone help you when you read instructions, pamphlets, or other written materials from your doctor or pharmacy?: 3 - Sometimes What is the last grade level you completed in school?: 13  Diabetic?no  Interpreter Needed?: No  Information entered by :: Amani Nodarse cma   Activities of Daily Living    05/12/2022    1:43 PM  In your present state of health, do you have any difficulty performing the following activities:  Hearing? 0  Vision? 0  Difficulty concentrating or making decisions? 0  Walking or climbing stairs? 0  Dressing or bathing? 0  Doing errands, shopping? 0  Preparing Food and eating ? N  Using the Toilet? N  In the past six months, have you accidently leaked urine? N  Do you  have problems with loss of bowel control? N  Managing your Medications? N  Managing your Finances? N  Housekeeping or managing your Housekeeping? N    Patient Care Team: Ma Hillock, DO as PCP - General (Family Medicine) Rolm Bookbinder, MD as Consulting Physician (Dermatology) Elayne Snare, MD as Consulting Physician (Endocrinology)  Indicate any recent Medical Services you may have received from other than [facility] providers in the past  year (date may be approximate).     Assessment:   This is a routine wellness examination for [place].  Hearing/Vision screen No results found.  Dietary issues and exercise activities discussed: Current Exercise Habits: The patient does not participate in regular exercise at present, Exercise limited by: orthopedic condition(s)   Goals Addressed   None   Depression Screen    05/12/2022    1:48 PM 02/11/2022    9:05 AM 05/06/2021    2:38 PM 01/27/2021   10:11 AM 04/30/2020    3:09 PM 02/19/2020   10:44 AM 12/27/2019   11:11 AM  PHQ 2/9 Scores  PHQ - 2 Score 0 0 0 3 0 0 0  PHQ- 9 Score    6       Fall Risk    05/12/2022    1:49 PM 05/06/2021    2:41 PM 04/30/2020    3:08 PM 02/19/2020   10:43 AM 12/27/2019   11:11 AM  [place] in the past year? 0 0 1 0   Number falls in past yr: 0 0 0 0 0  Injury with Fall? 0 0 0 0   Risk for fall due to : No Fall Risks Impaired vision   No Fall Risks  Follow up Falls evaluation completed Falls prevention discussed Falls prevention discussed Falls evaluation completed Falls evaluation completed    [place]:  Any stairs in or around the home? Yes  If so, are there any without handrails? Yes  Home free of loose throw rugs in walkways, pet beds, electrical cords, etc? Yes  Adequate lighting in your home to reduce risk of falls? Yes   ASSISTIVE DEVICES UTILIZED TO PREVENT FALLS:  Life alert? No  Use of a cane, walker or w/c? No  Grab bars in the bathroom?  Yes  Shower chair or bench in shower? Yes  Elevated toilet seat or a handicapped toilet? Yes   TIMED UP AND GO:  Was the test performed? No .      Cognitive Function:        05/12/2022    1:50 PM 05/06/2021    2:43 PM  6CIT Screen  What Year? 0 points 0 points  What month? 0 points 0 points  What time? 0 points 0 points  Count back from 20 0 points 0 points  Months in reverse 0 points 0 points  Repeat phrase 0 points 0 points  Total Score 0 points 0 points    Immunizations Immunization History  Administered Date(s) Administered   Influenza, High Dose Seasonal PF 04/11/2018   Pneumococcal Conjugate-13 08/25/2015   Pneumococcal Polysaccharide-23 02/19/2020   Tdap 08/27/2012    TDAP status: Up to date  Flu Vaccine status: Declined, Education has been provided regarding the importance of this vaccine but patient still declined. Advised may receive this vaccine at local pharmacy or Health Dept. Aware to provide a copy of the vaccination record if obtained from local pharmacy or Health Dept. Verbalized acceptance and understanding.  Pneumococcal vaccine status: Up to date  Covid-19 vaccine status: Information provided on how to obtain vaccines.   Qualifies for Shingles Vaccine? Yes   Zostavax completed No   Shingrix Completed?: No.    Education has been provided regarding the importance of this vaccine. Patient has been advised to call insurance company to determine out of pocket expense if they have not yet received this vaccine. Advised may also receive vaccine at local  pharmacy or Health Dept. Verbalized acceptance and understanding.  Screening Tests Health Maintenance  Topic Date Due   MAMMOGRAM  01/19/2020   DEXA SCAN  01/18/2021   Zoster Vaccines- Shingrix (1 of 2) 05/13/2022 (Originally 02/01/1996)   INFLUENZA VACCINE  08/15/2022 (Originally 12/15/2021)   DTaP/Tdap/Td (2 - Td or Tdap) 08/28/2022   Medicare Annual Wellness (AWV)  05/13/2023   Pneumonia Vaccine  42+ Years old  Completed   Hepatitis C Screening  Completed   HPV VACCINES  Aged Out   COVID-19 Vaccine  Discontinued    Health Maintenance  Health Maintenance Due  Topic Date Due   MAMMOGRAM  01/19/2020   DEXA SCAN  01/18/2021    Colorectal cancer screening: No longer required.   Mammogram status: Ordered 02/11/22. Pt provided with contact info and advised to call to schedule appt.   Bone Density status: Ordered 02/11/22. Pt provided with contact info and advised to call to schedule appt.  Lung Cancer Screening: (Low Dose CT Chest recommended if Age 1-80 years, 30 pack-year currently smoking OR have quit w/in 15years.) does not qualify.     Additional Screening:  Hepatitis C Screening: does qualify; Completed 12/26/2018  Vision Screening: Recommended annual ophthalmology exams for early detection of glaucoma and other disorders of the eye. Is the patient up to date with their annual eye exam?  Yes  Who is the provider or what is the name of the office in which the patient attends annual eye exams? [facility] If pt is not established with a provider, would they like to be referred to a provider to establish care? No .   Dental Screening: Recommended annual dental exams for proper oral hygiene  Community Resource Referral / Chronic Care Management: CRR required this visit?  No   CCM required this visit?  No      Plan:     I have personally reviewed and noted the following in the patient's chart:   Medical and social history Use of alcohol, tobacco or illicit drugs  Current medications and supplements including opioid prescriptions. Patient is not currently taking opioid prescriptions. Functional ability and status Nutritional status Physical activity Advanced directives List of other physicians Hospitalizations, surgeries, and ER visits in previous 12 months Vitals Screenings to include cognitive, depression, and falls Referrals and appointments  In  addition, I have reviewed and discussed with patient certain preventive protocols, quality metrics, and best practice recommendations. A written personalized care plan for preventive services as well as general preventive health recommendations were provided to patient.     Westley Hummer, CMA   05/12/2022   Nurse Notes: Non face to face 20 minutes.  Ms. Splinter , Thank you for taking time to come for your Medicare Wellness Visit. I appreciate your ongoing commitment to your health goals. Please review the following plan we discussed and let me know if I can assist you in the future.   These are the goals we discussed:  Goals      Patient Stated     Increase activity by walking more.     Patient Stated     Lose 20 lbs        This is a list of the screening recommended for you and due dates:  Health Maintenance  Topic Date Due   Mammogram  01/19/2020   DEXA scan (bone density measurement)  01/18/2021   Zoster (Shingles) Vaccine (1 of 2) 05/13/2022*   Flu Shot  08/15/2022*   DTaP/Tdap/Td vaccine (  2 - Td or Tdap) 08/28/2022   Medicare Annual Wellness Visit  05/13/2023   Pneumonia Vaccine  Completed   Hepatitis C Screening: USPSTF Recommendation to screen - Ages 18-79 yo.  Completed   HPV Vaccine  Aged Out   COVID-19 Vaccine  Discontinued  *Topic was postponed. The date shown is not the original due date.

## 2022-05-12 NOTE — Patient Instructions (Signed)
  Ms. Jobson , Thank you for taking time to come for your Medicare Wellness Visit. I appreciate your ongoing commitment to your health goals. Please review the following plan we discussed and let me know if I can assist you in the future.   These are the goals we discussed:  Goals      Patient Stated     Increase activity by walking more.     Patient Stated     Lose 20 lbs        This is a list of the screening recommended for you and due dates:  Health Maintenance  Topic Date Due   Mammogram  01/19/2020   DEXA scan (bone density measurement)  01/18/2021   Zoster (Shingles) Vaccine (1 of 2) 05/13/2022*   Flu Shot  08/15/2022*   DTaP/Tdap/Td vaccine (2 - Td or Tdap) 08/28/2022   Medicare Annual Wellness Visit  05/13/2023   Pneumonia Vaccine  Completed   Hepatitis C Screening: USPSTF Recommendation to screen - Ages 18-79 yo.  Completed   HPV Vaccine  Aged Out   COVID-19 Vaccine  Discontinued  *Topic was postponed. The date shown is not the original due date.

## 2022-07-29 ENCOUNTER — Ambulatory Visit (INDEPENDENT_AMBULATORY_CARE_PROVIDER_SITE_OTHER): Payer: Medicare HMO | Admitting: Family Medicine

## 2022-07-29 ENCOUNTER — Encounter: Payer: Self-pay | Admitting: Family Medicine

## 2022-07-29 VITALS — BP 133/72 | HR 71 | Temp 97.9°F | Ht 63.5 in | Wt 184.0 lb

## 2022-07-29 DIAGNOSIS — E782 Mixed hyperlipidemia: Secondary | ICD-10-CM

## 2022-07-29 DIAGNOSIS — G479 Sleep disorder, unspecified: Secondary | ICD-10-CM | POA: Diagnosis not present

## 2022-07-29 DIAGNOSIS — I10 Essential (primary) hypertension: Secondary | ICD-10-CM

## 2022-07-29 DIAGNOSIS — E034 Atrophy of thyroid (acquired): Secondary | ICD-10-CM | POA: Diagnosis not present

## 2022-07-29 MED ORDER — TRAZODONE HCL 50 MG PO TABS: 25.0000 mg | ORAL_TABLET | Freq: Every evening | ORAL | 1 refills | Status: DC | PRN

## 2022-07-29 NOTE — Patient Instructions (Signed)
No follow-ups on file.        Great to see you today.  I have refilled the medication(s) we provide.   If labs were collected, we will inform you of lab results once received either by echart message or telephone call.   - echart message- for normal results that have been seen by the patient already.   - telephone call: abnormal results or if patient has not viewed results in their echart.  

## 2022-07-29 NOTE — Progress Notes (Signed)
Patient ID: Alexis Beard, female  DOB: 1946/04/04, 77 y.o.   MRN: [id number] Patient Care Team    Relationship Specialty Notifications Start End  Ma Hillock, DO PCP - General Family Medicine  12/20/16   Rolm Bookbinder, MD Consulting Physician Dermatology  12/22/16   Elayne Snare, MD Consulting Physician Endocrinology  12/22/16     Chief Complaint  Patient presents with   Medical Management of Chronic Issues    [facility]   Insomnia    Subjective: Alexis Beard is a 77 y.o.  Female  present for Chronic Conditions/illness Management  All past medical history, surgical history, allergies, family history, immunizations, medications and social history were updated in the electronic medical record today. All recent labs, ED visits and hospitalizations within the last year were reviewed.   Hypertension/morbid obesity: Last visit amlodipine was DC'd.Patient denies chest pain, shortness of breath, dizziness or lower extremity edema.  Pt does not take a daily baby ASA. Pt is not  prescribed statin. Diet: Low-sodium diet  Exercise: Not routinely RF: Hypertension, family history present, obesity   Acquired hypothyroidism tsh normal 01/2022. Patient reports  compliance with Synthroid (name brand) 100 mcg daily on an empty stomach.  No complaints   Sleep disturbance: Patient reports compliance with  trazodone and sleep pattern is controlled with medication.  She typically takes a half a tab 25 mg nightly but occasionally will take a whole tablet 50 mg nightly.   Osteoporosis: Patient reports compliance with Fosamax 70 mg q. 7 days.  She is compliant with high-dose 50,000 units ergocalciferol every 14 days. Dexa due and has been ordered for her     05/12/2022    1:48 PM 02/11/2022    9:05 AM 05/06/2021    2:38 PM 01/27/2021   10:11 AM 04/30/2020    3:09 PM  Depression screen PHQ 2/9  Decreased Interest 0 0 0 2 0  Down, Depressed, Hopeless 0 0 0 1 0  PHQ - 2 Score 0 0 0 3 0  Altered sleeping     1   Tired, decreased energy    1   Change in appetite    1   Feeling bad or failure about yourself     0   Trouble concentrating    0   Moving slowly or fidgety/restless    0   Suicidal thoughts    0   PHQ-9 Score    6       01/27/2021   10:12 AM  GAD 7 : Generalized Anxiety Score  Nervous, Anxious, on Edge 0  Control/stop worrying 0  Worry too much - different things 1  Trouble relaxing 1  Restless 1  Easily annoyed or irritable 0  Afraid - awful might happen 0  Total GAD 7 Score 3   Immunization History  Administered Date(s) Administered   Influenza, High Dose Seasonal PF 04/11/2018   Pneumococcal Conjugate-13 08/25/2015   Pneumococcal Polysaccharide-23 02/19/2020   Tdap 08/27/2012   Past Medical History:  Diagnosis Date   Anemia    Arthritis    Basal cell carcinoma (BCC)    Basal cell Carcinoma skin CA, on foot   Cataract    Colon polyps    COVID-19 01/29/2021   Diverticulosis    GERD (gastroesophageal reflux disease)    OCC   Hypertension    Hypothyroid    Migraines    Osteoporosis    Allergies  Allergen Reactions   Codeine Nausea And Vomiting  Past Surgical History:  Procedure Laterality Date   BASAL CELL CARCINOMA EXCISION  09/2015   Back   birth mark removal  1998   on (R) side of neck   CARPAL [place]   Family History  Problem Relation Age of Onset   Diabetes Mother    Thyroid disease Mother    Heart disease Mother 68   Hyperlipidemia Father    Heart disease Father 82   Hyperlipidemia Sister    Cancer Maternal Aunt        Breast Cancer   Thyroid disease Maternal Aunt    Melanoma Maternal Aunt    Colon cancer Neg Hx    Esophageal cancer Neg Hx    Rectal cancer Neg Hx    Stomach cancer Neg Hx    Social History   Social History Narrative   Married to [place], has children.   High school graduate.   Drinks caffeine.   Wears her seatbelt, wears a bicycle helmet.   Wears glasses.    Smoke detector in the home, firearms locked in the home.   Feels safe in her relationships.   Right handed.    Allergies as of 07/29/2022       Reactions   Codeine Nausea And Vomiting        Medication List        Accurate as of July 29, 2022  9:59 AM. If you have any questions, ask your nurse or doctor.          alendronate 70 MG tablet Commonly known as: FOSAMAX Take 1 tablet (70 mg total) by mouth every 7 (seven) days. Take with a full glass of water on an empty stomach.   feeding supplement Liqd Take 1 Container by mouth every other day.   MIRALAX PO Take by mouth. Takes weekly   Synthroid 100 MCG tablet Generic drug: levothyroxine Take 1 tablet (100 mcg total) by mouth daily before breakfast.   traZODone 50 MG tablet Commonly known as: DESYREL Take 0.5-1 tablets (25-50 mg total) by mouth at bedtime as needed for sleep.   Vitamin D (Ergocalciferol) 1.25 MG (50000 UNIT) Caps capsule Commonly known as: DRISDOL Take 1 capsule (50,000 Units total) by mouth every 14 (fourteen) days.       All past medical history, surgical history, allergies, family history, immunizations andmedications were updated in the EMR today and reviewed under the history and medication portions of their EMR.     No results found for this or any previous visit (from the past 2160 hour(s)).   ROS 14 pt review of systems performed and negative (unless mentioned in an HPI)  Objective: BP 133/72   Pulse 71   Temp 97.9 F (36.6 C)   Ht 5' 3.5" (1.613 m)   Wt 184 lb (83.5 kg)   SpO2 98%   BMI 32.08 kg/m  Physical Exam Vitals and nursing note reviewed.  Constitutional:      General: She is not in acute distress.    Appearance: Normal appearance. She is not ill-appearing, toxic-appearing or diaphoretic.  HENT:     Head: Normocephalic and atraumatic.  Eyes:     General: No scleral icterus.       Right eye: No discharge.        Left eye: No discharge.     Extraocular  Movements: Extraocular movements intact.     Conjunctiva/sclera: Conjunctivae normal.  Pupils: Pupils are equal, round, and reactive to light.  Cardiovascular:     Rate and Rhythm: Normal rate and regular rhythm.  Pulmonary:     Effort: Pulmonary effort is normal. No respiratory distress.     Breath sounds: Normal breath sounds. No wheezing, rhonchi or rales.  Musculoskeletal:     Right lower leg: No edema.     Left lower leg: No edema.  Skin:    General: Skin is warm.     Findings: No rash.  Neurological:     Mental Status: She is alert and oriented to person, place, and time. Mental status is at baseline.     Motor: No weakness.     Gait: Gait normal.  Psychiatric:        Mood and Affect: Mood normal.        Behavior: Behavior normal.        Thought Content: Thought content normal.        Judgment: Judgment normal.      No results found.  Assessment/plan: TAMMI HARTVIGSEN is a 77 y.o. female present for Chronic Conditions/illness Management Hypothyroidism due to acquired atrophy of thyroid Continue synthroid 100 mcg Labs due next visit  Essential hypertension/Morbid obesity (HCC)/HLD Discontinued amlodipine last visit.  Blood pressures have remained stable. Labs up-to-date, due next visit  Sleep disturbance Stable Continue trazodone 25-50 m qhs prn - traZODone (DESYREL) 50 MG tablet; Take 0.5-1 tablets (25-50 mg total) by mouth at bedtime as needed for sleep.  Dispense: 45 tablet; Refill: 1  Osteoporosis without current pathological fracture, unspecified osteoporosis type/vit d def -Vitamin D levels up-to-date, due next visit -Continue Vitamin D, Ergocalciferol, (DRISDOL) 1.25 MG (50000 UNIT) CAPS capsule; Take 1 capsule (50,000 Units total) by mouth every 14 (fourteen) days.  Dispense: 6 capsule; Refill: 4 - DG Bone Density has been ordered  Return in about 7 months (around 02/14/2023) for cpe (20 min), Routine chronic condition follow-up.   No orders of the  defined types were placed in this encounter.  Meds ordered this encounter  Medications   traZODone (DESYREL) 50 MG tablet    Sig: Take 0.5-1 tablets (25-50 mg total) by mouth at bedtime as needed for sleep.    Dispense:  90 tablet    Refill:  1   Referral Orders  No referral(s) requested today     Electronically signed by: Howard Pouch, [place]

## 2022-10-18 DIAGNOSIS — Z961 Presence of intraocular lens: Secondary | ICD-10-CM | POA: Diagnosis not present

## 2022-10-18 DIAGNOSIS — H52223 Regular astigmatism, bilateral: Secondary | ICD-10-CM | POA: Diagnosis not present

## 2022-10-18 DIAGNOSIS — H5213 Myopia, bilateral: Secondary | ICD-10-CM | POA: Diagnosis not present

## 2022-10-18 DIAGNOSIS — H524 Presbyopia: Secondary | ICD-10-CM | POA: Diagnosis not present

## 2022-11-11 DIAGNOSIS — L814 Other melanin hyperpigmentation: Secondary | ICD-10-CM | POA: Diagnosis not present

## 2022-11-11 DIAGNOSIS — L565 Disseminated superficial actinic porokeratosis (DSAP): Secondary | ICD-10-CM | POA: Diagnosis not present

## 2022-11-11 DIAGNOSIS — D0471 Carcinoma in situ of skin of right lower limb, including hip: Secondary | ICD-10-CM | POA: Diagnosis not present

## 2022-11-11 DIAGNOSIS — R208 Other disturbances of skin sensation: Secondary | ICD-10-CM | POA: Diagnosis not present

## 2022-11-11 DIAGNOSIS — D2272 Melanocytic nevi of left lower limb, including hip: Secondary | ICD-10-CM | POA: Diagnosis not present

## 2022-11-11 DIAGNOSIS — D1801 Hemangioma of skin and subcutaneous tissue: Secondary | ICD-10-CM | POA: Diagnosis not present

## 2022-11-11 DIAGNOSIS — L821 Other seborrheic keratosis: Secondary | ICD-10-CM | POA: Diagnosis not present

## 2022-12-01 DIAGNOSIS — D0471 Carcinoma in situ of skin of right lower limb, including hip: Secondary | ICD-10-CM | POA: Diagnosis not present

## 2022-12-30 ENCOUNTER — Encounter (INDEPENDENT_AMBULATORY_CARE_PROVIDER_SITE_OTHER): Payer: Self-pay

## 2023-01-19 DIAGNOSIS — E669 Obesity, unspecified: Secondary | ICD-10-CM | POA: Diagnosis not present

## 2023-01-19 DIAGNOSIS — E034 Atrophy of thyroid (acquired): Secondary | ICD-10-CM | POA: Diagnosis not present

## 2023-01-19 DIAGNOSIS — E785 Hyperlipidemia, unspecified: Secondary | ICD-10-CM | POA: Diagnosis not present

## 2023-01-19 DIAGNOSIS — M810 Age-related osteoporosis without current pathological fracture: Secondary | ICD-10-CM | POA: Diagnosis not present

## 2023-01-19 DIAGNOSIS — Z6833 Body mass index (BMI) 33.0-33.9, adult: Secondary | ICD-10-CM | POA: Diagnosis not present

## 2023-01-19 DIAGNOSIS — I10 Essential (primary) hypertension: Secondary | ICD-10-CM | POA: Diagnosis not present

## 2023-01-19 DIAGNOSIS — Z008 Encounter for other general examination: Secondary | ICD-10-CM | POA: Diagnosis not present

## 2023-01-19 DIAGNOSIS — K219 Gastro-esophageal reflux disease without esophagitis: Secondary | ICD-10-CM | POA: Diagnosis not present

## 2023-01-19 DIAGNOSIS — M1990 Unspecified osteoarthritis, unspecified site: Secondary | ICD-10-CM | POA: Diagnosis not present

## 2023-01-19 DIAGNOSIS — G4700 Insomnia, unspecified: Secondary | ICD-10-CM | POA: Diagnosis not present

## 2023-02-14 ENCOUNTER — Encounter: Payer: Self-pay | Admitting: Family Medicine

## 2023-02-14 ENCOUNTER — Ambulatory Visit (INDEPENDENT_AMBULATORY_CARE_PROVIDER_SITE_OTHER): Payer: Medicare HMO | Admitting: Family Medicine

## 2023-02-14 VITALS — BP 130/70 | HR 71 | Ht 63.0 in | Wt 180.6 lb

## 2023-02-14 DIAGNOSIS — R7309 Other abnormal glucose: Secondary | ICD-10-CM

## 2023-02-14 DIAGNOSIS — M810 Age-related osteoporosis without current pathological fracture: Secondary | ICD-10-CM

## 2023-02-14 DIAGNOSIS — E559 Vitamin D deficiency, unspecified: Secondary | ICD-10-CM

## 2023-02-14 DIAGNOSIS — G479 Sleep disorder, unspecified: Secondary | ICD-10-CM

## 2023-02-14 DIAGNOSIS — E782 Mixed hyperlipidemia: Secondary | ICD-10-CM

## 2023-02-14 DIAGNOSIS — Z1231 Encounter for screening mammogram for malignant neoplasm of breast: Secondary | ICD-10-CM | POA: Diagnosis not present

## 2023-02-14 DIAGNOSIS — E034 Atrophy of thyroid (acquired): Secondary | ICD-10-CM | POA: Diagnosis not present

## 2023-02-14 DIAGNOSIS — Z23 Encounter for immunization: Secondary | ICD-10-CM

## 2023-02-14 DIAGNOSIS — I10 Essential (primary) hypertension: Secondary | ICD-10-CM

## 2023-02-14 DIAGNOSIS — Z0000 Encounter for general adult medical examination without abnormal findings: Secondary | ICD-10-CM

## 2023-02-14 LAB — CBC
HCT: 42 % (ref 36.0–46.0)
Hemoglobin: 13.6 g/dL (ref 12.0–15.0)
MCHC: 32.3 g/dL (ref 30.0–36.0)
MCV: 90.2 fL (ref 78.0–100.0)
Platelets: 307 10*3/uL (ref 150.0–400.0)
RBC: 4.66 Mil/uL (ref 3.87–5.11)
RDW: 13.8 % (ref 11.5–15.5)
WBC: 6.4 10*3/uL (ref 4.0–10.5)

## 2023-02-14 LAB — LIPID PANEL
Cholesterol: 201 mg/dL — ABNORMAL HIGH (ref 0–200)
HDL: 70.1 mg/dL (ref >39.00)
LDL Cholesterol: 99 mg/dL (ref 0–99)
NonHDL: 130.41
Total CHOL/HDL Ratio: 3
Triglycerides: 155 mg/dL — ABNORMAL HIGH (ref 0.0–149.0)
VLDL: 31 mg/dL (ref 0.0–40.0)

## 2023-02-14 LAB — COMPREHENSIVE METABOLIC PANEL
ALT: 11 U/L (ref 0–35)
AST: 14 U/L (ref 0–37)
Albumin: 4 g/dL (ref 3.5–5.2)
Alkaline Phosphatase: 64 U/L (ref 39–117)
BUN: 14 mg/dL (ref 6–23)
CO2: 28 meq/L (ref 19–32)
Calcium: 9 mg/dL (ref 8.4–10.5)
Chloride: 103 meq/L (ref 96–112)
Creatinine, Ser: 0.83 mg/dL (ref 0.40–1.20)
GFR: 68.18 mL/min (ref >60.00)
Glucose, Bld: 92 mg/dL (ref 70–99)
Potassium: 4.2 meq/L (ref 3.5–5.1)
Sodium: 140 meq/L (ref 135–145)
Total Bilirubin: 0.6 mg/dL (ref 0.2–1.2)
Total Protein: 6.8 g/dL (ref 6.0–8.3)

## 2023-02-14 LAB — VITAMIN D 25 HYDROXY (VIT D DEFICIENCY, FRACTURES): VITD: 46.15 ng/mL (ref 30.00–100.00)

## 2023-02-14 LAB — TSH: TSH: 0.85 u[IU]/mL (ref 0.35–5.50)

## 2023-02-14 LAB — HEMOGLOBIN A1C: Hgb A1c MFr Bld: 5.8 % (ref 4.6–6.5)

## 2023-02-14 MED ORDER — VITAMIN D (ERGOCALCIFEROL) 1.25 MG (50000 UNIT) PO CAPS: 50000.0000 [IU] | ORAL_CAPSULE | ORAL | 4 refills | Status: DC

## 2023-02-14 MED ORDER — TRAZODONE HCL 50 MG PO TABS: 25.0000 mg | ORAL_TABLET | Freq: Every evening | ORAL | 1 refills | Status: DC | PRN

## 2023-02-14 MED ORDER — TETANUS-DIPHTH-ACELL PERTUSSIS 5-2.5-18.5 LF-MCG/0.5 IM SUSP
0.5000 mL | Freq: Once | INTRAMUSCULAR | 0 refills | Status: AC
Start: 2023-02-14 — End: 2023-02-14

## 2023-02-14 NOTE — Patient Instructions (Addendum)

## 2023-02-14 NOTE — Progress Notes (Signed)
Patient ID: Alexis Beard, female  DOB: 20-Jan-1946, 77 y.o.   MRN: [id number] Patient Care Team    Relationship Specialty Notifications Start End  Natalia Leatherwood, DO PCP - General Family Medicine  12/20/16   Venancio Poisson, MD Consulting Physician Dermatology  12/22/16   Reather Littler, MD (Inactive) Consulting Physician Endocrinology  12/22/16     Chief Complaint  Patient presents with   Annual Exam    Fasting CPE. Declines flu vaccine    Subjective: Alexis Beard is a 77 y.o.  Female  present for CPE and Chronic Conditions/illness Management Vitamin D MND All past medical history, surgical history, allergies, family history, immunizations, medications and social history were updated in the electronic medical record today. All recent labs, ED visits and hospitalizations within the last year were reviewed.  Health maintenance:  Colonoscopy: > 75 not indicated Mammogram: completed 01/2019, ordered again for [facility] [employer]: tdap due-printed for her to get at pharmacy, influenza declined, only low-dose shall be administered (encouraged yearly), PNA series completed, Shingrix declined  Infectious disease screening: Hep C screening completed DEXA: last completed 01/2019 T score -4.2 osteoporosis.>>ordered again [facility] st Assistive device: none Oxygen [id number] Patient has a Dental home. Hospitalizations/ED visits: reviewed  Hypertension/morbid obesity: Pt reports compliance with amlodipine 10 mg daily a day.   Patient denies chest pain, shortness of breath, dizziness or lower extremity edema.  Pt does not take a daily baby ASA. Pt is not  prescribed statin. Diet: Low-sodium diet  Exercise: Not routinely RF: Hypertension, family history present, obesity   Acquired hypothyroidism Patient reports compliance with Synthroid (name brand) 100 mcg daily on an empty stomach.  No complaints   Sleep disturbance: Patient reports compliance  with  trazodone and sleep  pattern is controlled with medication.  She typically takes a half a tab/25 mg nightly but occasionally will take a whole tablet 50 mg nightly.   Osteoporosis: Patient been prescribed Fosamax 70 mg once weekly, she has not been taking this medication.   She is compliant t with high-dose 50,000 units ergocalciferol every 14 days. Dexa due-orders have been placed, patient has declined to schedule since  2020     02/14/2023    9:40 AM 05/12/2022    1:48 PM 02/11/2022    9:05 AM 05/06/2021    2:38 PM 01/27/2021   10:11 AM  Depression screen PHQ 2/9  Decreased Interest 0 0 0 0 2  Down, Depressed, Hopeless 0 0 0 0 1  PHQ - 2 Score 0 0 0 0 3  Altered sleeping 1    1  Tired, decreased energy 1    1  Change in appetite 0    1  Feeling bad or failure about yourself  0    0  Trouble concentrating 0    0  Moving slowly or fidgety/restless 0    0  Suicidal thoughts 0    0  PHQ-9 Score 2    6  Difficult doing work/chores Not difficult at all          02/14/2023    9:40 AM 01/27/2021   10:12 AM  GAD 7 : Generalized Anxiety Score  Nervous, Anxious, on Edge 1 0  Control/stop worrying 1 0  Worry too much - different things 1 1  Trouble relaxing 1 1  Restless 1 1  Easily annoyed or irritable 0 0  Afraid - awful might happen 0 0  Total GAD 7 Score  5 3  Anxiety Difficulty Somewhat difficult    Immunization History  Administered Date(s) Administered   Influenza, High Dose Seasonal PF 04/11/2018   Pneumococcal Conjugate-13 08/25/2015   Pneumococcal Polysaccharide-23 02/19/2020   Tdap 08/27/2012   Past Medical History:  Diagnosis Date   Anemia    Arthritis    Basal cell carcinoma (BCC)    Basal cell Carcinoma skin CA, on foot   Cataract    Colon polyps    COVID-19 01/29/2021   Diverticulosis    GERD (gastroesophageal reflux disease)    OCC   Hypertension    Hypothyroid    Migraines    Osteoporosis    Allergies  Allergen Reactions   Codeine Nausea And Vomiting   Past Surgical  History:  Procedure Laterality Date   BASAL CELL CARCINOMA EXCISION  09/2015   Back   birth mark removal  1998   on (R) side of neck   CARPAL TUNNEL RELEASE  1998   COLONOSCOPY     TONSILLECTOMY  1956   Family History  Problem Relation Age of Onset   Diabetes Mother    Thyroid disease Mother    Heart disease Mother 79   Hyperlipidemia Father    Heart disease Father 10   Hyperlipidemia Sister    Cancer Maternal Aunt        Breast Cancer   Thyroid disease Maternal Aunt    Melanoma Maternal Aunt    Colon cancer Neg Hx    Esophageal cancer Neg Hx    Rectal cancer Neg Hx    Stomach cancer Neg Hx    Social History   Social History Narrative   Married to [place], has children.   High school graduate.   Drinks caffeine.   Wears her seatbelt, wears a bicycle helmet.   Wears glasses.   Smoke detector in the home, firearms locked in the home.   Feels safe in her relationships.   Right handed.    Allergies as of 02/14/2023       Reactions   Codeine Nausea And Vomiting        Medication List        Accurate as of February 14, 2023  9:57 AM. If you have any questions, ask your nurse or doctor.          alendronate 70 MG tablet Commonly known as: FOSAMAX Take 1 tablet (70 mg total) by mouth every 7 (seven) days. Take with a full glass of water on an empty stomach.   feeding supplement Liqd Take 1 Container by mouth every other day.   MIRALAX PO Take by mouth. Takes weekly   Synthroid 100 MCG tablet Generic drug: levothyroxine Take 1 tablet (100 mcg total) by mouth daily before breakfast.   Tdap 5-2.5-18.5 LF-MCG/0.5 injection Commonly known as: BOOSTRIX Inject 0.5 mLs into the muscle once for 1 dose. Started by: Felix Pacini   traZODone 50 MG tablet Commonly known as: DESYREL Take 0.5-1 tablets (25-50 mg total) by mouth at bedtime as needed for sleep.   Vitamin D (Ergocalciferol) 1.25 MG (50000 UNIT) Caps capsule Commonly known as: DRISDOL Take 1  capsule (50,000 Units total) by mouth every 14 (fourteen) days.       All past medical history, surgical history, allergies, family history, immunizations andmedications were updated in the EMR today and reviewed under the history and medication portions of their EMR.     No results found for this or any previous visit (from the past 2160  hour(s)).    ROS 14 pt review of systems performed and negative (unless mentioned in an HPI)  Objective: BP 130/70   Pulse 71   Ht 5\' 3"  (1.6 m)   Wt 180 lb 9.6 oz (81.9 kg)   SpO2 97%   BMI 31.99 kg/m  Physical Exam Vitals and nursing note reviewed.  Constitutional:      General: She is not in acute distress.    Appearance: Normal appearance. She is not ill-appearing or toxic-appearing.  HENT:     Head: Normocephalic and atraumatic.     Right Ear: Tympanic membrane, ear canal and external ear normal. There is no impacted cerumen.     Left Ear: Tympanic membrane, ear canal and external ear normal. There is no impacted cerumen.     Nose: No congestion or rhinorrhea.     Mouth/Throat:     Mouth: Mucous membranes are moist.     Pharynx: Oropharynx is clear. No oropharyngeal exudate or posterior oropharyngeal erythema.  Eyes:     General: No scleral icterus.       Right eye: No discharge.        Left eye: No discharge.     Extraocular Movements: Extraocular movements intact.     Conjunctiva/sclera: Conjunctivae normal.     Pupils: Pupils are equal, round, and reactive to light.  Cardiovascular:     Rate and Rhythm: Normal rate and regular rhythm.     Pulses: Normal pulses.     Heart sounds: Normal heart sounds. No murmur heard.    No friction rub. No gallop.  Pulmonary:     Effort: Pulmonary effort is normal. No respiratory distress.     Breath sounds: Normal breath sounds. No stridor. No wheezing, rhonchi or rales.  Chest:     Chest wall: No tenderness.  Abdominal:     General: Abdomen is flat. Bowel sounds are normal. There is no  distension.     Palpations: Abdomen is soft. There is no mass.     Tenderness: There is no abdominal tenderness. There is no right CVA tenderness, left CVA tenderness, guarding or rebound.     Hernia: No hernia is present.  Musculoskeletal:        General: No swelling, tenderness or deformity. Normal range of motion.     Cervical back: Normal range of motion and neck supple. No rigidity or tenderness.     Right lower leg: No edema.     Left lower leg: No edema.  Lymphadenopathy:     Cervical: No cervical adenopathy.  Skin:    General: Skin is warm and dry.     Coloration: Skin is not jaundiced or pale.     Findings: No bruising, erythema, lesion or rash.  Neurological:     General: No focal deficit present.     Mental Status: She is alert and oriented to person, place, and time. Mental status is at baseline.     Cranial Nerves: No cranial nerve deficit.     Sensory: No sensory deficit.     Motor: No weakness.     Coordination: Coordination normal.     Gait: Gait normal.     Deep Tendon Reflexes: Reflexes normal.  Psychiatric:        Mood and Affect: Mood normal.        Behavior: Behavior normal.        Thought Content: Thought content normal.        Judgment: Judgment normal.  No results found.  Assessment/plan: KAY SHIPPY is a 77 y.o. female present for CPE/CMC Hypothyroidism due to acquired atrophy of thyroid Continue synthroid 100 mcg- refills will be provided in appropriate dose based on lab result today TSH, T4 free  Morbid obesity (HCC)/HLD Patient had been on amlodipine in the past for hypertension, she has been able to discontinue without needing to restart medication.  Sleep disturbance Stable  continue trazodone 25-50 m qhs prn  Osteoporosis without current pathological fracture, unspecified osteoporosis type/vit d def -Vitamin D levels collected today - DG Bone Density; Future-ordered again.  Patient has not scheduled since 2020  Elevated  hemoglobin A1c A1c collected today  Breast cancer screening by mammogram 3D mammogram bilateral screening-ordered placed again today.  Patient has not scheduled since 2020  Routine general medical examination at a health care facility Patient was encouraged to exercise greater than 150 minutes a week. Patient was encouraged to choose a diet filled with fresh fruits and vegetables, and lean meats. AVS provided to patient today for education/recommendation on gender specific health and safety maintenance. Colonoscopy: > 75 not indicated Mammogram: completed 01/2019, ordered again for [facility] [employer]: tdap due-printed for her to get at pharmacy, influenza declined, only low-dose shall be administered (encouraged yearly), PNA series completed, Shingrix declined  Infectious disease screening: Hep C screening completed DEXA: last completed 01/2019 T score -4.2 osteoporosis.>>ordered again [facility] st  Return in about 24 weeks (around 08/01/2023) for Routine chronic condition follow-up.   Orders Placed This Encounter  Procedures   DG Bone Density   MM 3D SCREENING MAMMOGRAM BILATERAL BREAST   Comprehensive metabolic panel   Hemoglobin A1c   TSH   Lipid panel   Vitamin D (25 hydroxy)   CBC   Meds ordered this encounter  Medications   traZODone (DESYREL) 50 MG tablet    Sig: Take 0.5-1 tablets (25-50 mg total) by mouth at bedtime as needed for sleep.    Dispense:  90 tablet    Refill:  1   Tdap (BOOSTRIX) 5-2.5-18.5 LF-MCG/0.5 injection    Sig: Inject 0.5 mLs into the muscle once for 1 dose.    Dispense:  0.5 mL    Refill:  0   Vitamin D, Ergocalciferol, (DRISDOL) 1.25 MG (50000 UNIT) CAPS capsule    Sig: Take 1 capsule (50,000 Units total) by mouth every 14 (fourteen) days.    Dispense:  6 capsule    Refill:  4   Referral Orders  No referral(s) requested today     Electronically signed by: Felix Pacini, DO [facility] [place]

## 2023-02-15 ENCOUNTER — Other Ambulatory Visit: Payer: Self-pay | Admitting: Family Medicine

## 2023-02-15 DIAGNOSIS — E039 Hypothyroidism, unspecified: Secondary | ICD-10-CM

## 2023-02-15 MED ORDER — SYNTHROID 100 MCG PO TABS
100.0000 ug | ORAL_TABLET | Freq: Every day | ORAL | 3 refills | Status: DC
Start: 2023-02-15 — End: 2024-03-06

## 2023-03-09 ENCOUNTER — Telehealth: Payer: Self-pay | Admitting: Family Medicine

## 2023-03-09 NOTE — Telephone Encounter (Signed)
Spoke with patient regarding results/recommendations.  

## 2023-03-09 NOTE — Telephone Encounter (Signed)
Patient is currently sick/ head congestion and wanted to know if something could be called in for her. Patient declined virtual and in person visit at the time of call. Will seek over the counter medications.

## 2023-03-10 ENCOUNTER — Encounter: Payer: Self-pay | Admitting: Family Medicine

## 2023-03-10 ENCOUNTER — Ambulatory Visit: Payer: Medicare HMO | Admitting: Family Medicine

## 2023-03-10 VITALS — BP 130/82 | HR 77 | Temp 98.1°F | Wt 181.4 lb

## 2023-03-10 DIAGNOSIS — R0981 Nasal congestion: Secondary | ICD-10-CM

## 2023-03-10 DIAGNOSIS — B9689 Other specified bacterial agents as the cause of diseases classified elsewhere: Secondary | ICD-10-CM

## 2023-03-10 DIAGNOSIS — J329 Chronic sinusitis, unspecified: Secondary | ICD-10-CM

## 2023-03-10 LAB — POCT INFLUENZA A/B
Influenza A, POC: NEGATIVE
Influenza B, POC: NEGATIVE

## 2023-03-10 LAB — POC COVID19 BINAXNOW: SARS Coronavirus 2 Ag: NEGATIVE

## 2023-03-10 MED ORDER — DOXYCYCLINE HYCLATE 100 MG PO TABS: 100.0000 mg | ORAL_TABLET | Freq: Two times a day (BID) | ORAL | 0 refills | Status: DC

## 2023-03-10 MED ORDER — DM-GUAIFENESIN ER 30-600 MG PO TB12
1.0000 | ORAL_TABLET | Freq: Two times a day (BID) | ORAL | 0 refills | Status: DC | PRN
Start: 2023-03-10 — End: 2023-08-10

## 2023-03-10 NOTE — Progress Notes (Signed)
Alexis Beard , Dec 07, 1945, 77 y.o., female MRN: [id number] Patient Care Team    Relationship Specialty Notifications Start End  Natalia Leatherwood, DO PCP - General Family Medicine  12/20/16   Venancio Poisson, MD Consulting Physician Dermatology  12/22/16   Reather Littler, MD (Inactive) Consulting Physician Endocrinology  12/22/16     Chief Complaint  Patient presents with   Nasal Congestion    2 day duration     Subjective: Alexis Beard is a 77 y.o. Pt presents for an OV with complaints of head congestion of 2 day duration.  Associated symptoms include productive cough, rhinorrhea, myalgia. Pt reports she was exposed to an ill child coughing nad sneezing in a store 3-4 days ago.  Pt has tried nothing to ease their symptoms.      02/14/2023    9:40 AM 05/12/2022    1:48 PM 02/11/2022    9:05 AM 05/06/2021    2:38 PM 01/27/2021   10:11 AM  Depression screen PHQ 2/9  Decreased Interest 0 0 0 0 2  Down, Depressed, Hopeless 0 0 0 0 1  PHQ - 2 Score 0 0 0 0 3  Altered sleeping 1    1  Tired, decreased energy 1    1  Change in appetite 0    1  Feeling bad or failure about yourself  0    0  Trouble concentrating 0    0  Moving slowly or fidgety/restless 0    0  Suicidal thoughts 0    0  PHQ-9 Score 2    6  Difficult doing work/chores Not difficult at all        Allergies  Allergen Reactions   Codeine Nausea And Vomiting   Social History   Social History Narrative   Married to [place], has children.   High school graduate.   Drinks caffeine.   Wears her seatbelt, wears a bicycle helmet.   Wears glasses.   Smoke detector in the home, firearms locked in the home.   Feels safe in her relationships.   Right handed.   Past Medical History:  Diagnosis Date   Anemia    Arthritis    Basal cell carcinoma (BCC)    Basal cell Carcinoma skin CA, on foot   Cataract    Colon polyps    COVID-19 01/29/2021   Diverticulosis    GERD (gastroesophageal reflux disease)    OCC    Hypertension    Hypothyroid    Migraines    Osteoporosis    Past Surgical History:  Procedure Laterality Date   BASAL CELL CARCINOMA EXCISION  09/2015   Back   birth mark removal  1998   on (R) side of neck   CARPAL TUNNEL RELEASE  1998   COLONOSCOPY     TONSILLECTOMY  1956   Family History  Problem Relation Age of Onset   Diabetes Mother    Thyroid disease Mother    Heart disease Mother 58   Hyperlipidemia Father    Heart disease Father 10   Hyperlipidemia Sister    Cancer Maternal Aunt        Breast Cancer   Thyroid disease Maternal Aunt    Melanoma Maternal Aunt    Colon cancer Neg Hx    Esophageal cancer Neg Hx    Rectal cancer Neg Hx    Stomach cancer Neg Hx    Allergies as of 03/10/2023  Reactions   Codeine Nausea And Vomiting        Medication List        Accurate as of March 10, 2023  9:52 AM. If you have any questions, ask your nurse or doctor.          alendronate 70 MG tablet Commonly known as: FOSAMAX Take 1 tablet (70 mg total) by mouth every 7 (seven) days. Take with a full glass of water on an empty stomach.   dextromethorphan-guaiFENesin 30-600 MG 12hr tablet Commonly known as: MUCINEX DM Take 1-2 tablets by mouth 2 (two) times daily as needed for cough. Started by: Felix Pacini   doxycycline 100 MG tablet Commonly known as: VIBRA-TABS Take 1 tablet (100 mg total) by mouth 2 (two) times daily. Started by: Felix Pacini   feeding supplement Liqd Take 1 Container by mouth every other day.   MIRALAX PO Take by mouth. Takes weekly   Synthroid 100 MCG tablet Generic drug: levothyroxine Take 1 tablet (100 mcg total) by mouth daily before breakfast.   traZODone 50 MG tablet Commonly known as: DESYREL Take 0.5-1 tablets (25-50 mg total) by mouth at bedtime as needed for sleep.   Vitamin D (Ergocalciferol) 1.25 MG (50000 UNIT) Caps capsule Commonly known as: DRISDOL Take 1 capsule (50,000 Units total) by mouth every 14  (fourteen) days.        All past medical history, surgical history, allergies, family history, immunizations andmedications were updated in the EMR today and reviewed under the history and medication portions of their EMR.     Review of Systems  Constitutional:  Positive for fever and malaise/fatigue. Negative for chills.  HENT:  Positive for congestion and sore throat. Negative for ear discharge and ear pain.   Eyes:  Negative for pain and discharge.  Respiratory:  Positive for cough and sputum production. Negative for shortness of breath.   Gastrointestinal:  Negative for diarrhea, nausea and vomiting.  Musculoskeletal:  Positive for myalgias.  Skin:  Negative for rash.  Neurological:  Positive for headaches. Negative for dizziness.   Negative, with the exception of above mentioned in HPI   Objective:  BP 130/82   Pulse 77   Temp 98.1 F (36.7 C)   Wt 181 lb 6.4 oz (82.3 kg)   SpO2 97%   BMI 32.13 kg/m  Body mass index is 32.13 kg/m. Physical Exam Vitals and nursing note reviewed.  Constitutional:      General: She is not in acute distress.    Appearance: Normal appearance. She is not ill-appearing, toxic-appearing or diaphoretic.  HENT:     Head: Normocephalic and atraumatic.     Right Ear: Tympanic membrane, ear canal and external ear normal.     Left Ear: Tympanic membrane, ear canal and external ear normal.     Nose: Congestion and rhinorrhea present.     Mouth/Throat:     Mouth: Mucous membranes are moist.     Pharynx: Posterior oropharyngeal erythema present. No oropharyngeal exudate.  Eyes:     General: No scleral icterus.       Right eye: No discharge.        Left eye: No discharge.     Extraocular Movements: Extraocular movements intact.     Conjunctiva/sclera: Conjunctivae normal.     Pupils: Pupils are equal, round, and reactive to light.  Cardiovascular:     Rate and Rhythm: Normal rate and regular rhythm.     Heart sounds: No murmur  heard. Pulmonary:  Effort: Pulmonary effort is normal. No respiratory distress.     Breath sounds: Normal breath sounds. No wheezing, rhonchi or rales.  Musculoskeletal:     Cervical back: Neck supple.     Right lower leg: No edema.     Left lower leg: No edema.  Lymphadenopathy:     Cervical: No cervical adenopathy.  Skin:    General: Skin is warm.     Findings: No rash.  Neurological:     Mental Status: She is alert and oriented to person, place, and time. Mental status is at baseline.     Motor: No weakness.     Gait: Gait normal.  Psychiatric:        Mood and Affect: Mood normal.        Behavior: Behavior normal.        Thought Content: Thought content normal.        Judgment: Judgment normal.     No results found. No results found. Results for orders placed or performed in visit on 03/10/23 (from the past 24 hour(s))  POC COVID-19 BinaxNow     Status: None   Collection Time: 03/10/23  9:34 AM  Result Value Ref Range   SARS Coronavirus 2 Ag Negative Negative  POCT Influenza A/B     Status: None   Collection Time: 03/10/23  9:34 AM  Result Value Ref Range   Influenza A, POC Negative Negative   Influenza B, POC Negative Negative    Assessment/Plan: Alexis Beard is a 77 y.o. female present for OV for  Head congestion/poss. Bacterial sinusitis - POC COVID-19 BinaxNow - POCT Influenza A/B Most likely viral illness. Exposure at a store from a child.  Rest, hydrate.  mucinex (DM if cough), nettie pot or nasal saline.  Doxy bid  prescribed, to start Saturday if symptoms are not improving or worsening.  F/U 2 weeks if not improved.   Reviewed expectations re: course of current medical issues. Discussed self-management of symptoms. Outlined signs and symptoms indicating need for more acute intervention. Patient verbalized understanding and all questions were answered. Patient received an After-Visit Summary.    Orders Placed This Encounter  Procedures   POC  COVID-19 BinaxNow   POCT Influenza A/B   Meds ordered this encounter  Medications   doxycycline (VIBRA-TABS) 100 MG tablet    Sig: Take 1 tablet (100 mg total) by mouth 2 (two) times daily.    Dispense:  20 tablet    Refill:  0   dextromethorphan-guaiFENesin (MUCINEX DM) 30-600 MG 12hr tablet    Sig: Take 1-2 tablets by mouth 2 (two) times daily as needed for cough.    Dispense:  60 tablet    Refill:  0   Referral Orders  No referral(s) requested today     Note is dictated utilizing voice recognition software. Although note has been proof read prior to signing, occasional typographical errors still can be missed. If any questions arise, please do not hesitate to call for verification.   electronically signed by:  Felix Pacini, DO   [facility] [place]

## 2023-03-10 NOTE — Patient Instructions (Addendum)
Return in about 2 weeks (around 03/24/2023), or if symptoms worsen or fail to improve.        Great to see you today.  I have refilled the medication(s) we provide.   If labs were collected or images ordered, we will inform you of  results once we have received them and reviewed. We will contact you either by echart message, or telephone call.  Please give ample time to the testing facility, and our office to run,  receive and review results. Please do not call inquiring of results, even if you can see them in your chart. We will contact you as soon as we are able. If it has been over 1 week since the test was completed, and you have not yet heard from [place], then please call us.    - echart message- for normal results that have been seen by the patient already.   - telephone call: abnormal results or if patient has not viewed results in their echart.  If a referral to a specialist was entered for you, please call us in 2 weeks if you have not heard from the specialist office to schedule.

## 2023-03-12 IMAGING — CT CT HEAD W/O CM
3 of 6 series · 16 of 47 positions shown, 19 images · non-contrast
Comparison: None.

CLINICAL DATA: Head trauma, loss of consciousness, COVID positive

EXAM:
CT HEAD WITHOUT CONTRAST
TECHNIQUE: Contiguous axial images were obtained from the base of the skull
through the vertex without intravenous contrast.

[Series 2: head w o · axial · 0.42mm/px · z∈[+1171,+1281]mm · 11 of 27 slices shown, 14 images]
[im 3/27  brain]
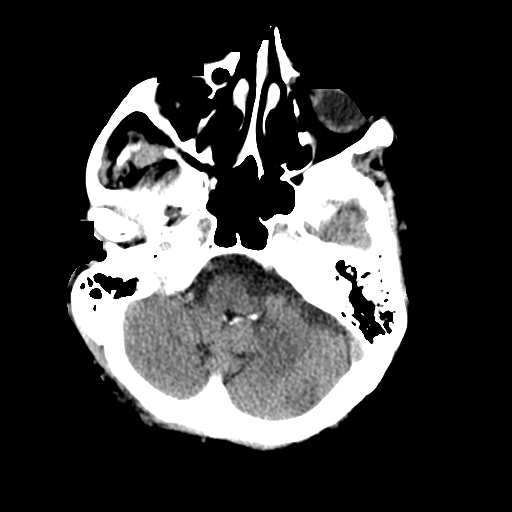
[im 3/27  bone]
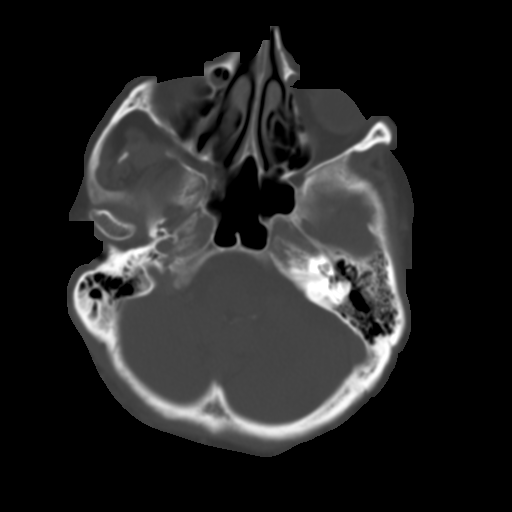
[im 5/27  brain]
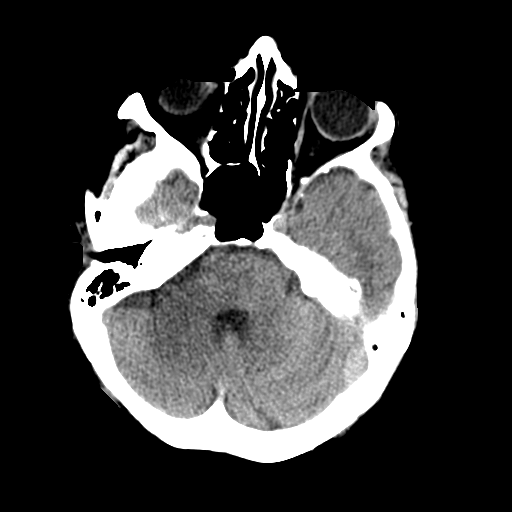
[im 7/27  brain]
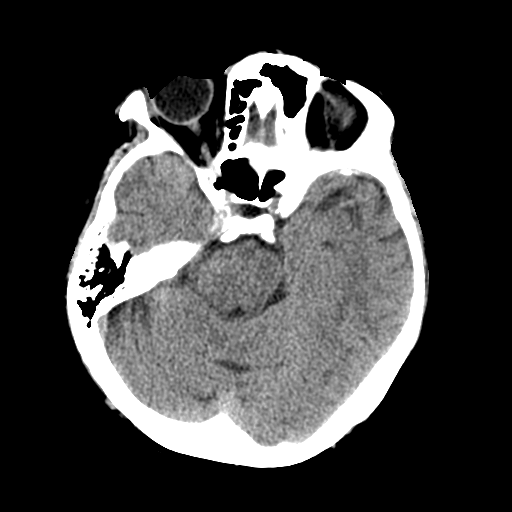
[im 10/27  brain]
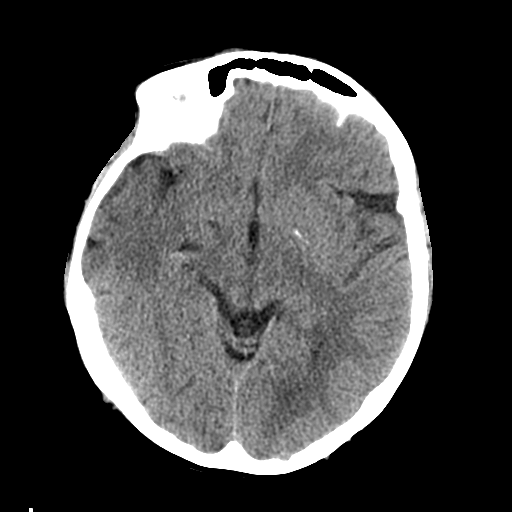
[im 12/27  brain]
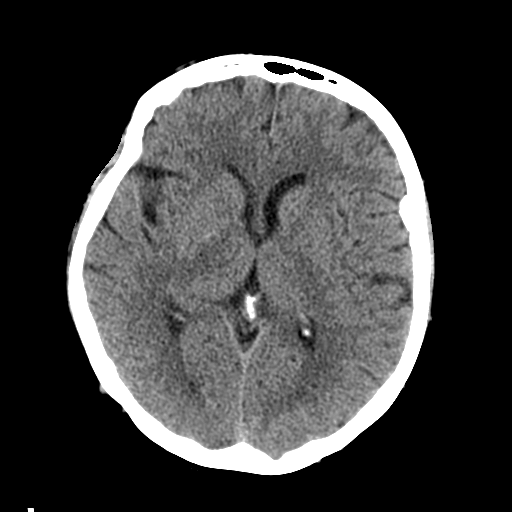
[im 12/27  bone]
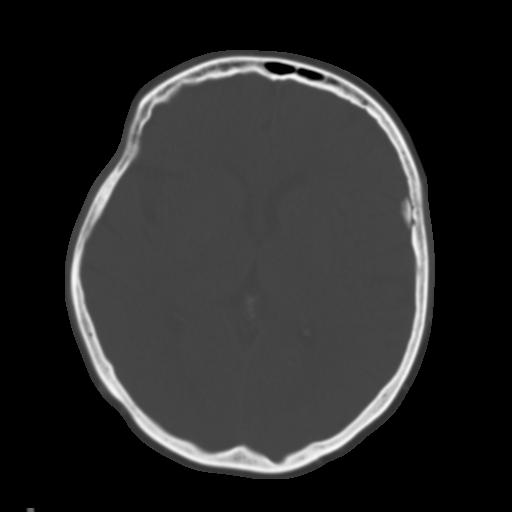
[im 14/27  brain]
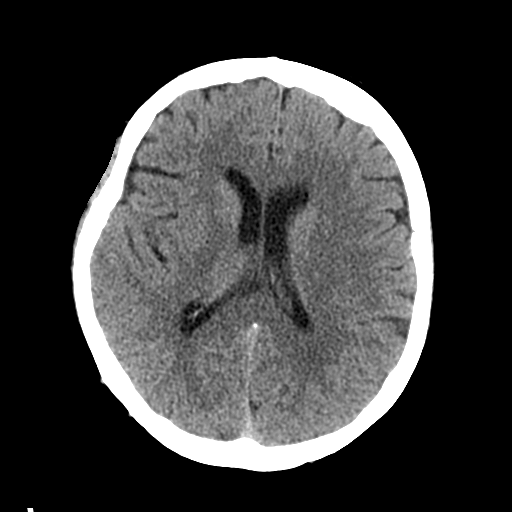
[im 16/27  brain]
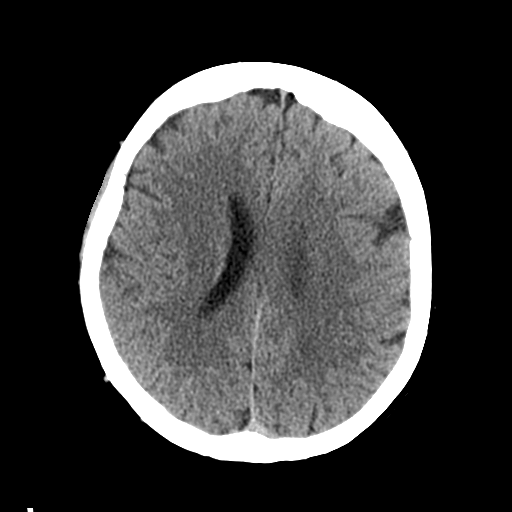
[im 19/27  brain]
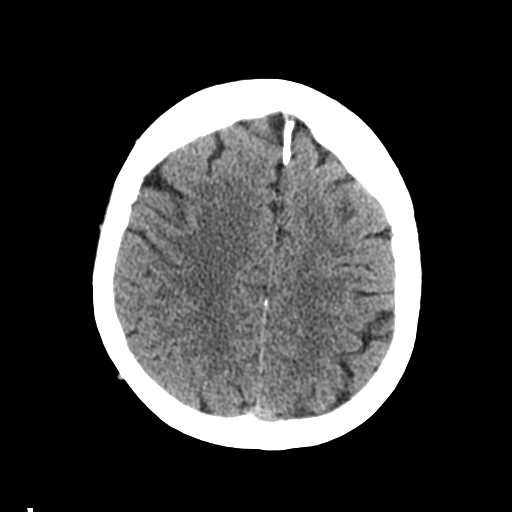
[im 21/27  brain]
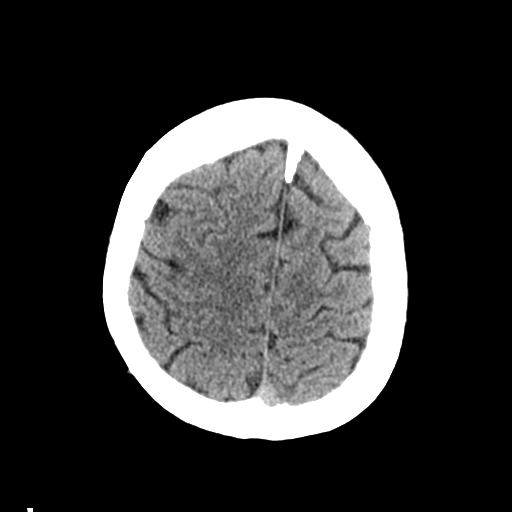
[im 21/27  bone]
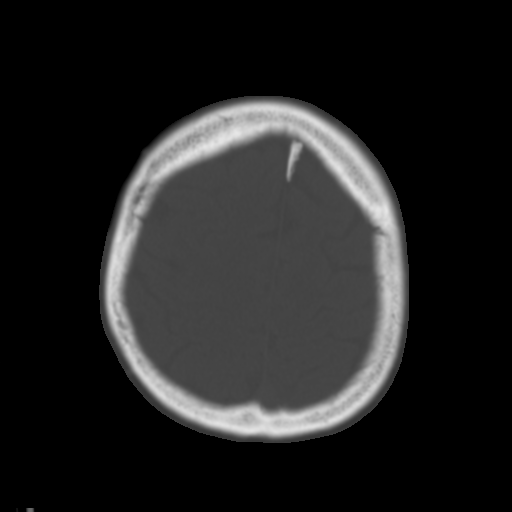
[im 23/27  brain]
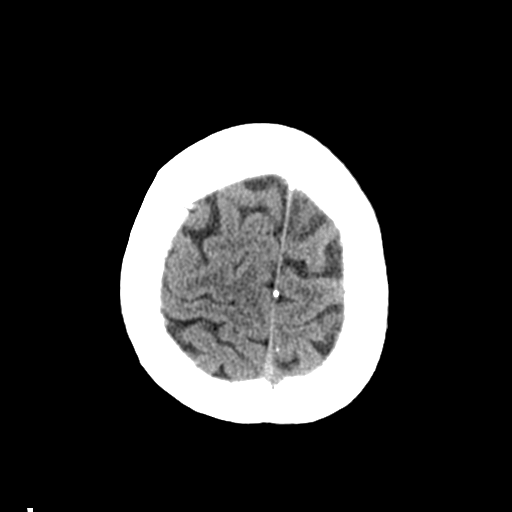
[im 25/27  brain]
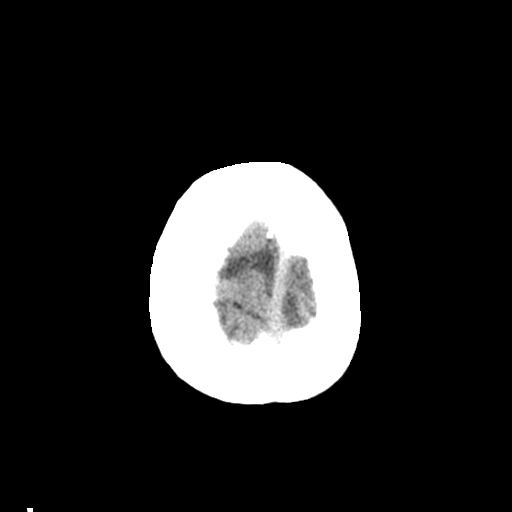

[Series 4: coronal soft · coronal · 0.33mm/px · 3 of 84 slices shown]
[im 21/84  brain]
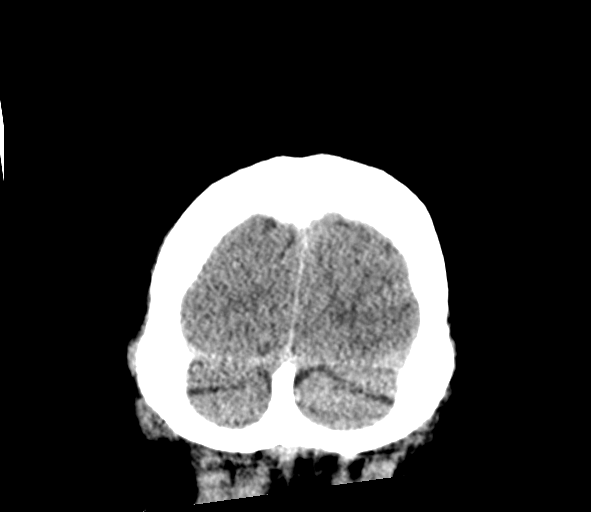
[im 42/84  brain]
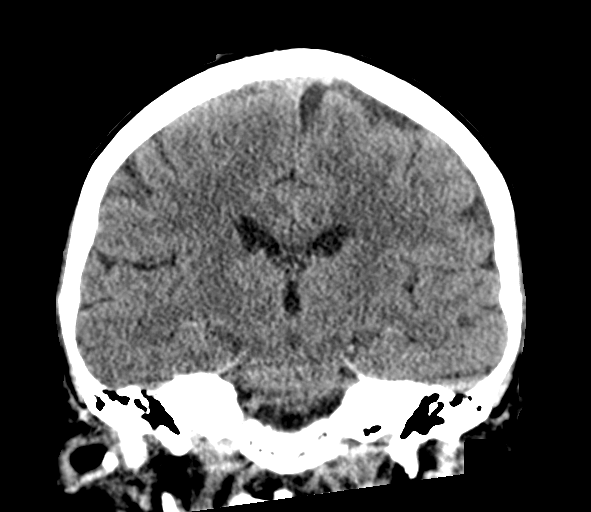
[im 63/84  brain]
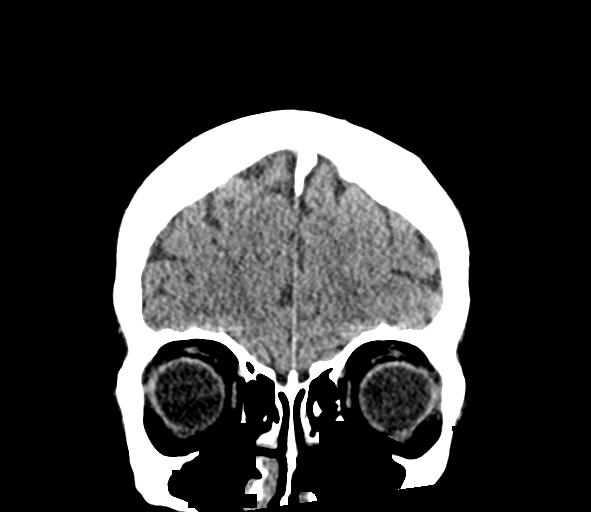

[Series 5: sagittal soft · sagittal · 0.34mm/px · 2 of 62 slices shown]
[im 21/62  brain]
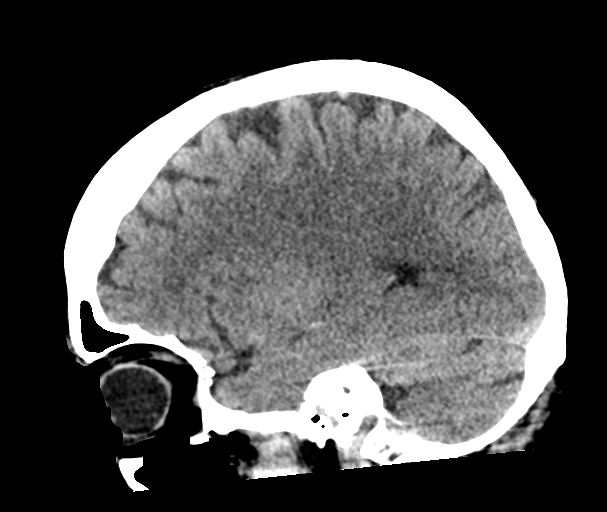
[im 41/62  brain]
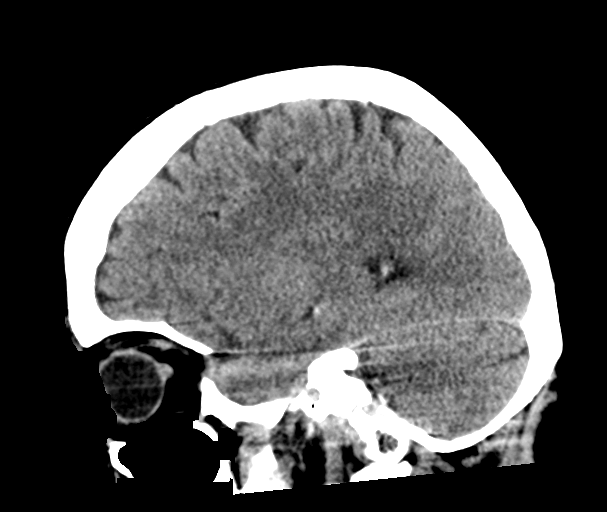

[16 of 47 positions shown; findings below may reference images not displayed]

FINDINGS: Brain: No evidence of acute infarction, hemorrhage, hydrocephalus,
extra-axial collection or mass lesion/mass effect.

Vascular: No hyperdense vessel or unexpected calcification.

Skull: Normal. Negative for fracture or focal lesion.

Sinuses/Orbits: No acute finding.

Other: None.
IMPRESSION: Normal head CT without contrast for age

## 2023-04-13 ENCOUNTER — Ambulatory Visit: Payer: Medicare HMO

## 2023-04-13 DIAGNOSIS — Z0000 Encounter for general adult medical examination without abnormal findings: Secondary | ICD-10-CM

## 2023-04-13 NOTE — Patient Instructions (Signed)
Alexis Beard , Thank you for taking time to come for your Medicare Wellness Visit. I appreciate your ongoing commitment to your health goals. Please review the following plan we discussed and let me know if I can assist you in the future.   Screening recommendations/referrals: Colonoscopy: no longer required Mammogram: up to date Bone Density: up to date Recommended yearly ophthalmology/optometry visit for glaucoma screening and checkup Recommended yearly dental visit for hygiene and checkup  Vaccinations: Influenza vaccine: Declined  Pneumococcal vaccine: up to date Tdap vaccine: Education provided Shingles vaccine: Education provided       Preventive Care 65 Years and Older, Female Preventive care refers to lifestyle choices and visits with your health care provider that can promote health and wellness. What does preventive care include? A yearly physical exam. This is also called an annual well check. Dental exams once or twice a year. Routine eye exams. Ask your health care provider how often you should have your eyes checked. Personal lifestyle choices, including: Daily care of your teeth and gums. Regular physical activity. Eating a healthy diet. Avoiding tobacco and drug use. Limiting alcohol use. Practicing safe sex. Taking low-dose aspirin every day. Taking vitamin and mineral supplements as recommended by your health care provider. What happens during an annual well check? The services and screenings done by your health care provider during your annual well check will depend on your age, overall health, lifestyle risk factors, and family history of disease. Counseling  Your health care provider may ask you questions about your: Alcohol use. Tobacco use. Drug use. Emotional well-being. Home and relationship well-being. Sexual activity. Eating habits. History of falls. Memory and ability to understand (cognition). Work and work Astronomer. Reproductive  health. Screening  You may have the following tests or measurements: Height, weight, and BMI. Blood pressure. Lipid and cholesterol levels. These may be checked every 5 years, or more frequently if you are over 37 years old. Skin check. Lung cancer screening. You may have this screening every year starting at age 39 if you have a 30-pack-year history of smoking and currently smoke or have quit within the past 15 years. Fecal occult blood test (FOBT) of the stool. You may have this test every year starting at age 6. Flexible sigmoidoscopy or colonoscopy. You may have a sigmoidoscopy every 5 years or a colonoscopy every 10 years starting at age 86. Hepatitis C blood test. Hepatitis B blood test. Sexually transmitted disease (STD) testing. Diabetes screening. This is done by checking your blood sugar (glucose) after you have not eaten for a while (fasting). You may have this done every 1-3 years. Bone density scan. This is done to screen for osteoporosis. You may have this done starting at age 85. Mammogram. This may be done every 1-2 years. Talk to your health care provider about how often you should have regular mammograms. Talk with your health care provider about your test results, treatment options, and if necessary, the need for more tests. Vaccines  Your health care provider may recommend certain vaccines, such as: Influenza vaccine. This is recommended every year. Tetanus, diphtheria, and acellular pertussis (Tdap, Td) vaccine. You may need a Td booster every 10 years. Zoster vaccine. You may need this after age 16. Pneumococcal 13-valent conjugate (PCV13) vaccine. One dose is recommended after age 49. Pneumococcal polysaccharide (PPSV23) vaccine. One dose is recommended after age 19. Talk to your health care provider about which screenings and vaccines you need and how often you need them. This information is  not intended to replace advice given to you by your health care provider.  Make sure you discuss any questions you have with your health care provider. Document Released: 05/30/2015 Document Revised: 01/21/2016 Document Reviewed: 03/04/2015 Elsevier Interactive Patient Education  2017 [employer].  Fall Prevention in the Home Falls can cause injuries. They can happen to people of all ages. There are many things you can do to make your home safe and to help prevent falls. What can I do on the outside of my home? Regularly fix the edges of walkways and driveways and fix any cracks. Remove anything that might make you trip as you walk through a door, such as a raised step or threshold. Trim any bushes or trees on the path to your home. Use bright outdoor lighting. Clear any walking paths of anything that might make someone trip, such as rocks or tools. Regularly check to see if handrails are loose or broken. Make sure that both sides of any steps have handrails. Any raised decks and porches should have guardrails on the edges. Have any leaves, snow, or ice cleared regularly. Use sand or salt on walking paths during winter. Clean up any spills in your garage right away. This includes oil or grease spills. What can I do in the bathroom? Use night lights. Install grab bars by the toilet and in the tub and shower. Do not use towel bars as grab bars. Use non-skid mats or decals in the tub or shower. If you need to sit down in the shower, use a plastic, non-slip stool. Keep the floor dry. Clean up any water that spills on the floor as soon as it happens. Remove soap buildup in the tub or shower regularly. Attach bath mats securely with double-sided non-slip rug tape. Do not have throw rugs and other things on the floor that can make you trip. What can I do in the bedroom? Use night lights. Make sure that you have a light by your bed that is easy to reach. Do not use any sheets or blankets that are too big for your bed. They should not hang down onto the floor. Have a  firm chair that has side arms. You can use this for support while you get dressed. Do not have throw rugs and other things on the floor that can make you trip. What can I do in the kitchen? Clean up any spills right away. Avoid walking on wet floors. Keep items that you use a lot in easy-to-reach places. If you need to reach something above you, use a strong step stool that has a grab bar. Keep electrical cords out of the way. Do not use floor polish or wax that makes floors slippery. If you must use wax, use non-skid floor wax. Do not have throw rugs and other things on the floor that can make you trip. What can I do with my stairs? Do not leave any items on the stairs. Make sure that there are handrails on both sides of the stairs and use them. Fix handrails that are broken or loose. Make sure that handrails are as long as the stairways. Check any carpeting to make sure that it is firmly attached to the stairs. Fix any carpet that is loose or worn. Avoid having throw rugs at the top or bottom of the stairs. If you do have throw rugs, attach them to the floor with carpet tape. Make sure that you have a light switch at the top of the  stairs and the bottom of the stairs. If you do not have them, ask someone to add them for you. What else can I do to help prevent falls? Wear shoes that: Do not have high heels. Have rubber bottoms. Are comfortable and fit you well. Are closed at the toe. Do not wear sandals. If you use a stepladder: Make sure that it is fully opened. Do not climb a closed stepladder. Make sure that both sides of the stepladder are locked into place. Ask someone to hold it for you, if possible. Clearly mark and make sure that you can see: Any grab bars or handrails. First and last steps. Where the edge of each step is. Use tools that help you move around (mobility aids) if they are needed. These include: Canes. Walkers. Scooters. Crutches. Turn on the lights when you  go into a dark area. Replace any light bulbs as soon as they burn out. Set up your furniture so you have a clear path. Avoid moving your furniture around. If any of your floors are uneven, fix them. If there are any pets around you, be aware of where they are. Review your medicines with your doctor. Some medicines can make you feel dizzy. This can increase your chance of falling. Ask your doctor what other things that you can do to help prevent falls. This information is not intended to replace advice given to you by your health care provider. Make sure you discuss any questions you have with your health care provider. Document Released: 02/27/2009 Document Revised: 10/09/2015 Document Reviewed: 06/07/2014 Elsevier Interactive Patient Education  2017 [employer].

## 2023-04-13 NOTE — Progress Notes (Signed)
Subjective:   Alexis Beard is a 78 y.o. female who presents for Medicare Annual (Subsequent) preventive examination.  Visit Complete: Virtual I connected with  Wayland Salinas on 04/13/23 by a audio enabled telemedicine application and verified that I am speaking with the correct person using two identifiers.  Patient Location: Home  Provider Location: Home Office  I discussed the limitations of evaluation and management by telemedicine. The patient expressed understanding and agreed to proceed.  Vital Signs: Because this visit was a virtual/telehealth visit, some criteria may be missing or patient reported. Any vitals not documented were not able to be obtained and vitals that have been documented are patient reported. Cardiac Risk Factors include: advanced age (>29men, >55 women);obesity (BMI >30kg/m2)     Objective:    There were no vitals filed for this visit. There is no height or weight on file to calculate BMI.     04/13/2023   11:02 AM 05/12/2022    1:49 PM 05/06/2021    2:39 PM 01/18/2021    3:44 AM 04/30/2020    3:04 PM  Advanced Directives  Does Patient Have a Medical Advance Directive? No Yes Yes No No  Type of Special educational needs teacher of [place];Living will Living will    Copy of Healthcare Power of Attorney in Chart?  No - copy requested     Would patient like information on creating a medical advance directive? No - Patient declined   No - Patient declined Yes (MAU/Ambulatory/Procedural Areas - Information given)    Current Medications (verified) Outpatient Encounter Medications as of 04/13/2023  Medication Sig   alendronate (FOSAMAX) 70 MG tablet Take 1 tablet (70 mg total) by mouth every 7 (seven) days. Take with a full glass of water on an empty stomach.   dextromethorphan-guaiFENesin (MUCINEX DM) 30-600 MG 12hr tablet Take 1-2 tablets by mouth 2 (two) times daily as needed for cough.   feeding supplement (BOOST HIGH PROTEIN) LIQD Take 1  Container by mouth every other day.   Polyethylene Glycol 3350 (MIRALAX PO) Take by mouth. Takes weekly   SYNTHROID 100 MCG tablet Take 1 tablet (100 mcg total) by mouth daily before breakfast.   traZODone (DESYREL) 50 MG tablet Take 0.5-1 tablets (25-50 mg total) by mouth at bedtime as needed for sleep.   Vitamin D, Ergocalciferol, (DRISDOL) 1.25 MG (50000 UNIT) CAPS capsule Take 1 capsule (50,000 Units total) by mouth every 14 (fourteen) days.   doxycycline (VIBRA-TABS) 100 MG tablet Take 1 tablet (100 mg total) by mouth 2 (two) times daily. (Patient not taking: Reported on 04/13/2023)   No facility-administered encounter medications on file as of 04/13/2023.    Allergies (verified) Codeine   History: Past Medical History:  Diagnosis Date   Anemia    Arthritis    Basal cell carcinoma (BCC)    Basal cell Carcinoma skin CA, on foot   Cataract    Colon polyps    COVID-19 01/29/2021   Diverticulosis    GERD (gastroesophageal reflux disease)    OCC   Hypertension    Hypothyroid    Migraines    Osteoporosis    Past Surgical History:  Procedure Laterality Date   BASAL CELL CARCINOMA EXCISION  09/2015   Back   birth mark removal  1998   on (R) side of neck   CARPAL TUNNEL RELEASE  1998   COLONOSCOPY     TONSILLECTOMY  1956   Family History  Problem Relation Age of Onset  Diabetes Mother    Thyroid disease Mother    Heart disease Mother 43   Hyperlipidemia Father    Heart disease Father 83   Hyperlipidemia Sister    Cancer Maternal Aunt        Breast Cancer   Thyroid disease Maternal Aunt    Melanoma Maternal Aunt    Colon cancer Neg Hx    Esophageal cancer Neg Hx    Rectal cancer Neg Hx    Stomach cancer Neg Hx    Social History   Socioeconomic History   Marital status: Married    Spouse name: Fayrene Fearing   Number of children: 3   Years of education: 12   Highest education level: Not on file  Occupational History   Occupation: Controller  Tobacco Use   Smoking  status: Never   Smokeless tobacco: Never  Vaping Use   Vaping status: Never Used  Substance and Sexual Activity   Alcohol use: No   Drug use: No   Sexual activity: Never    Partners: Male    Birth control/protection: Condom  Other Topics Concern   Not on file  Social History Narrative   Married to [place], has children.   High school graduate.   Drinks caffeine.   Wears her seatbelt, wears a bicycle helmet.   Wears glasses.   Smoke detector in the home, firearms locked in the home.   Feels safe in her relationships.   Right handed.   Social Determinants of Health   Financial Resource Strain: Low Risk  (04/13/2023)   Overall Financial Resource Strain (CARDIA)    Difficulty of Paying Living Expenses: Not hard at all  Food Insecurity: No Food Insecurity (04/13/2023)   Hunger Vital Sign    Worried About Running Out of Food in the Last Year: Never true    Ran Out of Food in the Last Year: Never true  Transportation Needs: No Transportation Needs (04/13/2023)   PRAPARE - Administrator, Civil Service (Medical): No    Lack of Transportation (Non-Medical): No  Physical Activity: Inactive (04/13/2023)   Exercise Vital Sign    Days of Exercise per Week: 0 days    Minutes of Exercise per Session: 0 min  Stress: No Stress Concern Present (04/13/2023)   [employer] of Occupational Health - Occupational Stress Questionnaire    Feeling of Stress : Not at all  Social Connections: Moderately Integrated (04/13/2023)   Social Connection and Isolation Panel [NHANES]    Frequency of Communication with Friends and Family: More than three times a week    Frequency of Social Gatherings with Friends and Family: More than three times a week    Attends Religious Services: More than 4 times per year    Active Member of [employer] or Organizations: No    Attends Engineer, structural: Never    Marital Status: Married    Tobacco Counseling Counseling given: Not  Answered   Clinical Intake:  Pre-visit preparation completed: Yes  Pain : No/denies pain     Diabetes: No  How often do you need to have someone help you when you read instructions, pamphlets, or other written materials from your doctor or pharmacy?: 1 - Never  Interpreter Needed?: No  Information entered by :: Remi Haggard LPN   Activities of Daily Living    04/13/2023   11:05 AM 05/12/2022    1:43 PM  In your present state of health, do you have any difficulty performing the following  activities:  Hearing? 0 0  Vision? 0 0  Difficulty concentrating or making decisions? 0 0  Walking or climbing stairs? 0 0  Dressing or bathing? 0 0  Doing errands, shopping? 0 0  Preparing Food and eating ? N N  Using the Toilet? N N  In the past six months, have you accidently leaked urine? N N  Do you have problems with loss of bowel control? N N  Managing your Medications? N N  Managing your Finances? N N  Housekeeping or managing your Housekeeping? N N    Patient Care Team: Natalia Leatherwood, DO as PCP - General (Family Medicine) Venancio Poisson, MD as Consulting Physician (Dermatology) Reather Littler, MD (Inactive) as Consulting Physician (Endocrinology)  Indicate any recent Medical Services you may have received from other than [facility] providers in the past year (date may be approximate).     Assessment:   This is a routine wellness examination for [place].  Hearing/Vision screen Hearing Screening - Comments:: No trouble hearing Vision Screening - Comments:: Up to date [facility]   Goals Addressed             This Visit's Progress    Patient Stated       Would like to travel more in our camper       Depression Screen    04/13/2023   11:06 AM 02/14/2023    9:40 AM 05/12/2022    1:48 PM 02/11/2022    9:05 AM 05/06/2021    2:38 PM 01/27/2021   10:11 AM 04/30/2020    3:09 PM  PHQ 2/9 Scores  PHQ - 2 Score 0 0 0 0 0 3 0  PHQ- 9 Score 1 2    6      Fall Risk     04/13/2023   10:58 AM 02/14/2023    9:39 AM 05/12/2022    1:49 PM 05/06/2021    2:41 PM 04/30/2020    3:08 PM  Fall Risk   Falls in the past year? 0 0 0 0 1  Number falls in past yr: 0 0 0 0 0  Injury with Fall? 0 0 0 0 0  Risk for fall due to :  No Fall Risks No Fall Risks Impaired vision   Follow up Falls evaluation completed;Education provided;Falls prevention discussed Falls evaluation completed Falls evaluation completed Falls prevention discussed Falls prevention discussed    MEDICARE RISK AT HOME: Medicare Risk at Home Any stairs in or around the home?: Yes If so, are there any without handrails?: No Home free of loose throw rugs in walkways, pet beds, electrical cords, etc?: Yes Adequate lighting in your home to reduce risk of falls?: Yes Life alert?: No Use of a cane, walker or w/c?: No Grab bars in the bathroom?: Yes Shower chair or bench in shower?: Yes Elevated toilet seat or a handicapped toilet?: Yes  TIMED UP AND GO:  Was the test performed?  No    Cognitive Function:        04/13/2023   11:10 AM 05/12/2022    1:50 PM 05/06/2021    2:43 PM  6CIT Screen  What Year? 0 points 0 points 0 points  What month? 0 points 0 points 0 points  What time? 0 points 0 points 0 points  Count back from 20 0 points 0 points 0 points  Months in reverse 0 points 0 points 0 points  Repeat phrase 0 points 0 points 0 points  Total Score 0  points 0 points 0 points    Immunizations Immunization History  Administered Date(s) Administered   Influenza, High Dose Seasonal PF 04/11/2018   Pneumococcal Conjugate-13 08/25/2015   Pneumococcal Polysaccharide-23 02/19/2020   Tdap 08/27/2012    TDAP status: Due, Education has been provided regarding the importance of this vaccine. Advised may receive this vaccine at local pharmacy or Health Dept. Aware to provide a copy of the vaccination record if obtained from local pharmacy or Health Dept. Verbalized acceptance and  understanding.  Flu Vaccine status: Declined, Education has been provided regarding the importance of this vaccine but patient still declined. Advised may receive this vaccine at local pharmacy or Health Dept. Aware to provide a copy of the vaccination record if obtained from local pharmacy or Health Dept. Verbalized acceptance and understanding.  Pneumococcal vaccine status: Up to date  Covid-19 vaccine status: Information provided on how to obtain vaccines.   Qualifies for Shingles Vaccine? Yes   Zostavax completed No   Shingrix Completed?: No.    Education has been provided regarding the importance of this vaccine. Patient has been advised to call insurance company to determine out of pocket expense if they have not yet received this vaccine. Advised may also receive vaccine at local pharmacy or Health Dept. Verbalized acceptance and understanding.  Screening Tests Health Maintenance  Topic Date Due   MAMMOGRAM  01/19/2020   DEXA SCAN  01/18/2021   INFLUENZA VACCINE  08/15/2023 (Originally 12/16/2022)   DTaP/Tdap/Td (2 - Td or Tdap) 04/12/2024 (Originally 08/28/2022)   Medicare Annual Wellness (AWV)  04/12/2024   Pneumonia Vaccine 64+ Years old  Completed   Hepatitis C Screening  Completed   HPV VACCINES  Aged Out   COVID-19 Vaccine  Discontinued   Zoster Vaccines- Shingrix  Discontinued    Health Maintenance  Health Maintenance Due  Topic Date Due   MAMMOGRAM  01/19/2020   DEXA SCAN  01/18/2021    Colorectal cancer screening: No longer required.   Mammogram status: Completed  . Repeat every year  Bone Density status: Completed 2024. Results reflect: Bone density results: OSTEOPOROSIS. Repeat every 2 years.  Lung Cancer Screening: (Low Dose CT Chest recommended if Age 65-80 years, 20 pack-year currently smoking OR have quit w/in 15years.) does not qualify.   Lung Cancer Screening Referral:   Additional Screening:  Hepatitis C Screening: does not qualify; Completed  2020  Vision Screening: Recommended annual ophthalmology exams for early detection of glaucoma and other disorders of the eye. Is the patient up to date with their annual eye exam?  Yes  Who is the provider or what is the name of the office in which the patient attends annual eye exams? [facility] If pt is not established with a provider, would they like to be referred to a provider to establish care? No .   Dental Screening: Recommended annual dental exams for proper oral hygiene   Community Resource Referral / Chronic Care Management: CRR required this visit?  No   CCM required this visit?  No     Plan:     I have personally reviewed and noted the following in the patient's chart:   Medical and social history Use of alcohol, tobacco or illicit drugs  Current medications and supplements including opioid prescriptions. Patient is not currently taking opioid prescriptions. Functional ability and status Nutritional status Physical activity Advanced directives List of other physicians Hospitalizations, surgeries, and ER visits in previous 12 months Vitals Screenings to include cognitive, depression, and  falls Referrals and appointments  In addition, I have reviewed and discussed with patient certain preventive protocols, quality metrics, and best practice recommendations. A written personalized care plan for preventive services as well as general preventive health recommendations were provided to patient.     Remi Haggard, LPN   [id number]   After Visit Summary: (MyChart) Due to this being a telephonic visit, the after visit summary with patients personalized plan was offered to patient via MyChart   Nurse Notes:

## 2023-07-12 ENCOUNTER — Ambulatory Visit: Payer: Self-pay | Admitting: Family Medicine

## 2023-07-12 ENCOUNTER — Telehealth (INDEPENDENT_AMBULATORY_CARE_PROVIDER_SITE_OTHER): Payer: Medicare HMO | Admitting: Family Medicine

## 2023-07-12 DIAGNOSIS — R21 Rash and other nonspecific skin eruption: Secondary | ICD-10-CM

## 2023-07-12 MED ORDER — TRIAMCINOLONE ACETONIDE 0.1 % EX CREA: 1.0000 | TOPICAL_CREAM | Freq: Every day | CUTANEOUS | 0 refills | Status: DC

## 2023-07-12 MED ORDER — CLOTRIMAZOLE 1 % EX CREA: 1.0000 | TOPICAL_CREAM | Freq: Two times a day (BID) | CUTANEOUS | 0 refills | Status: DC

## 2023-07-12 NOTE — Telephone Encounter (Signed)
 Chief Complaint: Rash Symptoms: Red rash, itching Frequency: 1 week Pertinent Negatives: Patient denies fever Disposition: [] ED /[] Urgent Care (no appt availability in office) / [x] Appointment(In office/virtual)/ []  [facility]/ [] Home Care/ [] Refused Recommended Disposition /[] [facility]/ []  Follow-up with PCP Additional Notes: Patient called in stating she has been experiencing a rash on the back of her neck and back since last week when she was experiencing the flu. Patient states the rash set in two days after the flu begun, when her fever broke, and appeared initially as red welts all over her back. Rash has no settled into red pinprick dots all over back and back of neck, and itches on 8/10 scale. Patient also states the rash feels like nerve ending sensation when something comes into contact with her skin. Patient states she no longer has any flu symptoms but the rash is not resolving with any type of medication or cream. Virtual appt mae for further assessment.    Copied from CRM [id number]. Topic: Clinical - Red Word Triage >> Jul 12, 2023  9:34 AM Drema Balzarine wrote: Red Word that prompted transfer to Nurse Triage: Patient experiencing extreme itchiness on back says she has a rash since last week. The rash goes from neck down to torso. Would like something sent to pharmacy. Reason for Disposition  Localized rash present > 7 days  Answer Assessment - Initial Assessment Questions 1. APPEARANCE of RASH: "Describe the rash."      Rash with red spots  2. LOCATION: "Where is the rash located?"      Rash on back of neck down to back 3. NUMBER: "How many spots are there?"      All over neck and back 4. SIZE: "How big are the spots?" (Inches, centimeters or compare to size of a coin)      Pin prick size 5. ONSET: "When did the rash start?"      A week ago 6. ITCHING: "Does the rash itch?" If Yes, ask: "How bad is the itch?"  (Scale 0-10; or none, mild, moderate, severe)      Yes 7. PAIN: "Does the rash hurt?" If Yes, ask: "How bad is the pain?"  (Scale 0-10; or none, mild, moderate, severe)    - NONE (0): no pain    - MILD (1-3): doesn't interfere with normal activities     - MODERATE (4-7): interferes with normal activities or awakens from sleep     - SEVERE (8-10): excruciating pain, unable to do any normal activities     9 8. OTHER SYMPTOMS: "Do you have any other symptoms?" (e.g., fever)     Feels like nerve endings when the rash is touched  Protocols used: Rash or Redness - Localized-A-AH

## 2023-07-12 NOTE — Telephone Encounter (Signed)
 Appt was scheduled for different office by call center.

## 2023-07-12 NOTE — Patient Instructions (Signed)
-  I sent the medication(s) we discussed to your pharmacy: Meds ordered this encounter  Medications   clotrimazole (CLOTRIMAZOLE ANTI-FUNGAL) 1 % cream    Sig: Apply 1 Application topically 2 (two) times daily.    Dispense:  30 g    Refill:  0   triamcinolone cream (KENALOG) 0.1 %    Sig: Apply 1 Application topically daily.    Dispense:  45 g    Refill:  0     I hope you are feeling better soon!  Seek in person care promptly if your symptoms worsen, new concerns arise or you are not improving with treatment over the next few days.  It was nice to meet you today. I help [facility] out with telemedicine visits on Tuesdays and Thursdays and am happy to help if you need a virtual follow up visit on those days. Otherwise, if you have any concerns or questions following this visit please schedule a follow up visit with your Primary Care office or seek care at a local urgent care clinic to avoid delays in care. If you are having severe or life threatening symptoms please call 911 and/or go to the nearest emergency room.

## 2023-07-12 NOTE — Progress Notes (Signed)
 Virtual Visit via Video Note  I connected with Alexis Beard  on 07/12/23 at 10:20 AM EST by a video enabled telemedicine application and verified that I am speaking with the correct person using two identifiers.  Location patient: [place] Location provider:work or home office Persons participating in the virtual visit: patient, provider  I discussed the limitations and requested verbal permission for telemedicine visit. The patient expressed understanding and agreed to proceed.   HPI:  Acute telemedicine visit for a rash: -Onset: 4 days ago, got sick with what she thinks was the flu 1 week ago (fevers, chills, body aches, cough, vomiting, diarrhea for several days - now all of the theses symptoms have resolved except for the rash and mild cough) -Symptoms include: itchy rash on the back of neck, back (nothing on the front of the body) - "only where I was lying on my back while sick and sweaty" -Denies: remaining fever, chills, malaise, NVD, body aches, CP, SOB -Has tried: various different otc creams (benadryl, hct, menthol cream) has helped a little -Pertinent past medical history: see below -Pertinent medication allergies: Allergies  Allergen Reactions   Codeine Nausea And Vomiting   -COVID-19 vaccine status:  Immunization History  Administered Date(s) Administered   Influenza, High Dose Seasonal PF 04/11/2018   Pneumococcal Conjugate-13 08/25/2015   Pneumococcal Polysaccharide-23 02/19/2020   Tdap 08/27/2012     ROS: See pertinent positives and negatives per HPI.  Past Medical History:  Diagnosis Date   Anemia    Arthritis    Basal cell carcinoma (BCC)    Basal cell Carcinoma skin CA, on foot   Cataract    Colon polyps    COVID-19 01/29/2021   Diverticulosis    GERD (gastroesophageal reflux disease)    OCC   Hypertension    Hypothyroid    Migraines    Osteoporosis     Past Surgical History:  Procedure Laterality Date   BASAL CELL CARCINOMA EXCISION  09/2015   Back    birth mark removal  1998   on (R) side of neck   CARPAL TUNNEL RELEASE  1998   COLONOSCOPY     TONSILLECTOMY  1956     Current Outpatient Medications:    clotrimazole (CLOTRIMAZOLE ANTI-FUNGAL) 1 % cream, Apply 1 Application topically 2 (two) times daily., Disp: 30 g, Rfl: 0   triamcinolone cream (KENALOG) 0.1 %, Apply 1 Application topically daily., Disp: 45 g, Rfl: 0   alendronate (FOSAMAX) 70 MG tablet, Take 1 tablet (70 mg total) by mouth every 7 (seven) days. Take with a full glass of water on an empty stomach., Disp: 4 tablet, Rfl: 11   dextromethorphan-guaiFENesin (MUCINEX DM) 30-600 MG 12hr tablet, Take 1-2 tablets by mouth 2 (two) times daily as needed for cough., Disp: 60 tablet, Rfl: 0   doxycycline (VIBRA-TABS) 100 MG tablet, Take 1 tablet (100 mg total) by mouth 2 (two) times daily. (Patient not taking: Reported on 04/13/2023), Disp: 20 tablet, Rfl: 0   feeding supplement (BOOST HIGH PROTEIN) LIQD, Take 1 Container by mouth every other day., Disp: , Rfl:    Polyethylene Glycol 3350 (MIRALAX PO), Take by mouth. Takes weekly, Disp: , Rfl:    SYNTHROID 100 MCG tablet, Take 1 tablet (100 mcg total) by mouth daily before breakfast., Disp: 90 tablet, Rfl: 3   traZODone (DESYREL) 50 MG tablet, Take 0.5-1 tablets (25-50 mg total) by mouth at bedtime as needed for sleep., Disp: 90 tablet, Rfl: 1   Vitamin D, Ergocalciferol, (DRISDOL) 1.25  MG (50000 UNIT) CAPS capsule, Take 1 capsule (50,000 Units total) by mouth every 14 (fourteen) days., Disp: 6 capsule, Rfl: 4  EXAM:  VITALS per patient if applicable:  GENERAL: alert, oriented, appears well and in no acute distress  HEENT: atraumatic, conjunttiva clear, no obvious abnormalities on inspection of external nose and ears  NECK: normal movements of the head and neck  LUNGS: on inspection no signs of respiratory distress, breathing rate appears normal, no obvious gross SOB, gasping or wheezing  CV: no obvious cyanosis  SKIN:  scattered erythematous papule in various sizes (approx 2-4 mm on ave) and stages, some excoriated on visual exam over video visit, husband confirms elevated and that some are excoriated.  MS: moves all visible extremities without noticeable abnormality  PSYCH/NEURO: pleasant and cooperative, no obvious depression or anxiety, speech and thought processing grossly intact  ASSESSMENT AND PLAN:  Discussed the following assessment and plan:  Rash  -we discussed possible serious and likely etiologies, options for evaluation and workup, limitations of telemedicine visit vs in person visit, treatment, treatment risks and precautions. Pt is agreeable to treatment via telemedicine at this moment. Query viral related rash vs fungal/yeast vs other. She wants to try a steroid cream. Reasonable, and sent Rx for triam cream. Also, advised antifungal clotrimazole as well.  Advised to seek prompt in person care if worsening, new symptoms arise, or if is not improving with treatment over the next few days as expected per our conversation of expected course. Discussed options for follow up care. Did let this patient know that I do telemedicine on Tuesdays and Thursdays for [facility] and those are the days I am logged into the system. Advised to schedule follow up visit with PCP, [facility] virtual visits or [facility] if any further questions or concerns to avoid delays in care.   Spent over 15 minute in evaluation and counseling, orders, etc. I discussed the assessment and treatment plan with the patient. The patient was provided an opportunity to ask questions and all were answered. The patient agreed with the plan and demonstrated an understanding of the instructions.     Terressa Koyanagi, DO

## 2023-07-12 NOTE — Telephone Encounter (Signed)
 Appt was not scheduled. LVM to discuss with pt.

## 2023-08-01 ENCOUNTER — Ambulatory Visit: Payer: Medicare HMO | Admitting: Family Medicine

## 2023-08-10 ENCOUNTER — Encounter: Payer: Self-pay | Admitting: Family Medicine

## 2023-08-10 ENCOUNTER — Ambulatory Visit (INDEPENDENT_AMBULATORY_CARE_PROVIDER_SITE_OTHER): Payer: Medicare HMO | Admitting: Family Medicine

## 2023-08-10 VITALS — BP 132/82 | HR 84 | Temp 98.1°F | Wt 175.0 lb

## 2023-08-10 DIAGNOSIS — G479 Sleep disorder, unspecified: Secondary | ICD-10-CM

## 2023-08-10 DIAGNOSIS — R7309 Other abnormal glucose: Secondary | ICD-10-CM

## 2023-08-10 DIAGNOSIS — E782 Mixed hyperlipidemia: Secondary | ICD-10-CM

## 2023-08-10 DIAGNOSIS — E559 Vitamin D deficiency, unspecified: Secondary | ICD-10-CM

## 2023-08-10 DIAGNOSIS — M810 Age-related osteoporosis without current pathological fracture: Secondary | ICD-10-CM | POA: Diagnosis not present

## 2023-08-10 DIAGNOSIS — G629 Polyneuropathy, unspecified: Secondary | ICD-10-CM | POA: Insufficient documentation

## 2023-08-10 DIAGNOSIS — R5383 Other fatigue: Secondary | ICD-10-CM

## 2023-08-10 DIAGNOSIS — M6281 Muscle weakness (generalized): Secondary | ICD-10-CM | POA: Diagnosis not present

## 2023-08-10 DIAGNOSIS — C4492 Squamous cell carcinoma of skin, unspecified: Secondary | ICD-10-CM | POA: Diagnosis not present

## 2023-08-10 DIAGNOSIS — E063 Autoimmune thyroiditis: Secondary | ICD-10-CM

## 2023-08-10 LAB — COMPREHENSIVE METABOLIC PANEL WITH GFR
ALT: 13 U/L (ref 0–35)
AST: 15 U/L (ref 0–37)
Albumin: 4.3 g/dL (ref 3.5–5.2)
Alkaline Phosphatase: 73 U/L (ref 39–117)
BUN: 10 mg/dL (ref 6–23)
CO2: 31 meq/L (ref 19–32)
Calcium: 9.7 mg/dL (ref 8.4–10.5)
Chloride: 101 meq/L (ref 96–112)
Creatinine, Ser: 0.71 mg/dL (ref 0.40–1.20)
GFR: 81.96 mL/min (ref >60.00)
Glucose, Bld: 93 mg/dL (ref 70–99)
Potassium: 4.7 meq/L (ref 3.5–5.1)
Sodium: 140 meq/L (ref 135–145)
Total Bilirubin: 0.6 mg/dL (ref 0.2–1.2)
Total Protein: 7.3 g/dL (ref 6.0–8.3)

## 2023-08-10 LAB — CBC WITH DIFFERENTIAL/PLATELET
Basophils Absolute: 0.1 10*3/uL (ref 0.0–0.1)
Basophils Relative: 0.7 % (ref 0.0–3.0)
Eosinophils Absolute: 0.2 10*3/uL (ref 0.0–0.7)
Eosinophils Relative: 2.3 % (ref 0.0–5.0)
HCT: 42.5 % (ref 36.0–46.0)
Hemoglobin: 14.1 g/dL (ref 12.0–15.0)
Lymphocytes Relative: 35.5 % (ref 12.0–46.0)
Lymphs Abs: 2.7 10*3/uL (ref 0.7–4.0)
MCHC: 33.1 g/dL (ref 30.0–36.0)
MCV: 90.4 fl (ref 78.0–100.0)
Monocytes Absolute: 0.6 10*3/uL (ref 0.1–1.0)
Monocytes Relative: 8 % (ref 3.0–12.0)
Neutro Abs: 4 10*3/uL (ref 1.4–7.7)
Neutrophils Relative %: 53.5 % (ref 43.0–77.0)
Platelets: 300 10*3/uL (ref 150.0–400.0)
RBC: 4.71 Mil/uL (ref 3.87–5.11)
RDW: 14.3 % (ref 11.5–15.5)
WBC: 7.5 10*3/uL (ref 4.0–10.5)

## 2023-08-10 LAB — HEMOGLOBIN A1C: Hgb A1c MFr Bld: 5.9 % (ref 4.6–6.5)

## 2023-08-10 MED ORDER — VITAMIN D (ERGOCALCIFEROL) 1.25 MG (50000 UNIT) PO CAPS: 50000.0000 [IU] | ORAL_CAPSULE | ORAL | 4 refills | Status: DC

## 2023-08-10 MED ORDER — TRAZODONE HCL 50 MG PO TABS: 25.0000 mg | ORAL_TABLET | Freq: Every evening | ORAL | 1 refills | Status: DC | PRN

## 2023-08-10 MED ORDER — ALENDRONATE SODIUM 70 MG PO TABS: 70.0000 mg | ORAL_TABLET | ORAL | 11 refills | Status: DC

## 2023-08-10 NOTE — Progress Notes (Signed)
 Patient ID: Alexis Beard, female  DOB: [birth year], 78 y.o.   MRN: [id number] Patient Care Team    Relationship Specialty Notifications Start End  Natalia Leatherwood, DO PCP - General Family Medicine  12/20/16   Venancio Poisson, MD Consulting Physician Dermatology  12/22/16     Chief Complaint  Patient presents with   Hypertension    Subjective: Alexis Beard is a 78 y.o.  Female  present for Chronic Conditions/illness Management Vitamin D MND All past medical history, surgical history, allergies, family history, immunizations, medications and social history were updated in the electronic medical record today. All recent labs, ED visits and hospitalizations within the last year were reviewed. . Acquired hypothyroidism Patient reports compliance with Synthroid (name brand) 100 mcg daily on an empty stomach.  No complaints   Sleep disturbance: Patient reports compliance  with  trazodone and sleep pattern is controlled with medication.  She typically takes a half a tab/25 mg nightly but occasionally will take a whole tablet 50 mg nightly.   Osteoporosis: Patient been prescribed Fosamax 70 mg once weekly, she has not been taking this medication.   She is compliant t with high-dose 50,000 units ergocalciferol every 14 days. Dexa due-orders have been placed, patient has declined to schedule since  2020.  She is uncertain if she is taking the Fosamax   Fatigue/muscle weakness: Patient reports she had influenza last month and has not been able to get her energy back.  She has a long ED course of which she presumed was influenza, many people in her family has seen illness.  She reports she still feels like she is weak, describing the weakness as generalized muscle weakness and fatigue.  She denies any fever, chills or persistent cough.     08/10/2023   10:27 AM 04/13/2023   11:06 AM 02/14/2023    9:40 AM 05/12/2022    1:48 PM 02/11/2022    9:05 AM  Depression screen PHQ 2/9  Decreased Interest  0 0 0 0 0  Down, Depressed, Hopeless 0 0 0 0 0  PHQ - 2 Score 0 0 0 0 0  Altered sleeping 1 1 1     Tired, decreased energy 2 0 1    Change in appetite 1 0 0    Feeling bad or failure about yourself  0 0 0    Trouble concentrating 0 0 0    Moving slowly or fidgety/restless 0 0 0    Suicidal thoughts 0 0 0    PHQ-9 Score 4 1 2     Difficult doing work/chores Somewhat difficult Not difficult at all Not difficult at all        08/10/2023   10:28 AM 02/14/2023    9:40 AM 01/27/2021   10:12 AM  GAD 7 : Generalized Anxiety Score  Nervous, Anxious, on Edge 0 1 0  Control/stop worrying 0 1 0  Worry too much - different things 0 1 1  Trouble relaxing 1 1 1   Restless 1 1 1   Easily annoyed or irritable 0 0 0  Afraid - awful might happen 0 0 0  Total GAD 7 Score 2 5 3   Anxiety Difficulty Somewhat difficult Somewhat difficult    Immunization History  Administered Date(s) Administered   Influenza, High Dose Seasonal PF 04/11/2018   Pneumococcal Conjugate-13 08/25/2015   Pneumococcal Polysaccharide-23 02/19/2020   Tdap 08/27/2012   Past Medical History:  Diagnosis Date   Anemia    Arthritis  Basal cell carcinoma (BCC)    Basal cell Carcinoma skin CA, on foot   Cataract    Colon polyps    COVID-19 01/29/2021   Diverticulosis    GERD (gastroesophageal reflux disease)    OCC   Hypertension    Hypothyroid    Migraines    Osteoporosis    Allergies  Allergen Reactions   Codeine Nausea And Vomiting   Past Surgical History:  Procedure Laterality Date   BASAL CELL CARCINOMA EXCISION  09/2015   Back   birth mark removal  1998   on (R) side of neck   CARPAL TUNNEL RELEASE  1998   COLONOSCOPY     TONSILLECTOMY  1956   Family History  Problem Relation Age of Onset   Diabetes Mother    Thyroid disease Mother    Heart disease Mother 90   Hyperlipidemia Father    Heart disease Father 32   Hyperlipidemia Sister    Cancer Maternal Aunt        Breast Cancer   Thyroid disease  Maternal Aunt    Melanoma Maternal Aunt    Colon cancer Neg Hx    Esophageal cancer Neg Hx    Rectal cancer Neg Hx    Stomach cancer Neg Hx    Social History   Social History Narrative   Married to [place], has children.   High school graduate.   Drinks caffeine.   Wears her seatbelt, wears a bicycle helmet.   Wears glasses.   Smoke detector in the home, firearms locked in the home.   Feels safe in her relationships.   Right handed.    Allergies as of 08/10/2023       Reactions   Codeine Nausea And Vomiting        Medication List        Accurate as of August 10, 2023  4:11 PM. If you have any questions, ask your nurse or doctor.          STOP taking these medications    dextromethorphan-guaiFENesin 30-600 MG 12hr tablet Commonly known as: MUCINEX DM Stopped by: Felix Pacini   doxycycline 100 MG tablet Commonly known as: VIBRA-TABS Stopped by: Felix Pacini       TAKE these medications    alendronate 70 MG tablet Commonly known as: FOSAMAX Take 1 tablet (70 mg total) by mouth every 7 (seven) days. Take with a full glass of water on an empty stomach.   clotrimazole 1 % cream Commonly known as: Clotrimazole Anti-Fungal Apply 1 Application topically 2 (two) times daily.   feeding supplement Liqd Take 1 Container by mouth every other day.   MIRALAX PO Take by mouth. Takes weekly   Synthroid 100 MCG tablet Generic drug: levothyroxine Take 1 tablet (100 mcg total) by mouth daily before breakfast.   traZODone 50 MG tablet Commonly known as: DESYREL Take 0.5-1 tablets (25-50 mg total) by mouth at bedtime as needed for sleep.   triamcinolone cream 0.1 % Commonly known as: KENALOG Apply 1 Application topically daily.   Vitamin D (Ergocalciferol) 1.25 MG (50000 UNIT) Caps capsule Commonly known as: DRISDOL Take 1 capsule (50,000 Units total) by mouth every 14 (fourteen) days.       All past medical history, surgical history, allergies, family  history, immunizations andmedications were updated in the EMR today and reviewed under the history and medication portions of their EMR.     No results found for this or any previous visit (from the past  2160 hours).    ROS 14 pt review of systems performed and negative (unless mentioned in an HPI)  Objective: BP 132/82   Pulse 84   Temp 98.1 F (36.7 C)   Wt 175 lb (79.4 kg)   SpO2 97%   BMI 31.00 kg/m  Physical Exam Vitals and nursing note reviewed.  Constitutional:      General: She is not in acute distress.    Appearance: Normal appearance. She is not ill-appearing, toxic-appearing or diaphoretic.  HENT:     Head: Normocephalic and atraumatic.  Eyes:     General: No scleral icterus.       Right eye: No discharge.        Left eye: No discharge.     Extraocular Movements: Extraocular movements intact.     Conjunctiva/sclera: Conjunctivae normal.     Pupils: Pupils are equal, round, and reactive to light.  Cardiovascular:     Rate and Rhythm: Normal rate and regular rhythm.  Pulmonary:     Effort: Pulmonary effort is normal. No respiratory distress.     Breath sounds: Normal breath sounds. No wheezing, rhonchi or rales.  Musculoskeletal:     Right lower leg: No edema.     Left lower leg: No edema.  Skin:    General: Skin is warm.     Findings: No rash.  Neurological:     Mental Status: She is alert and oriented to person, place, and time. Mental status is at baseline.     Motor: No weakness.     Gait: Gait normal.  Psychiatric:        Mood and Affect: Mood normal.        Behavior: Behavior normal.        Thought Content: Thought content normal.        Judgment: Judgment normal.      No results found.  Assessment/plan: Alexis Beard is a 78 y.o. female present for [facility] Hypothyroidism due to acquired atrophy of thyroid Continue synthroid 100 mcg Labs UTD-01/2024  Morbid obesity (HCC)/HLD Patient had been on amlodipine in the past for hypertension, she has  been able to discontinue without needing to restart medication. Monitor BP   Sleep disturbance Stable Continue trazodone 25-50 m qhs prn  Osteoporosis without current pathological fracture, unspecified osteoporosis type/vit d def -Vitamin D levels UTD 01/2024 - DG Bone Density; Future-ordered again.  Patient has not scheduled since 2020 - fosamax refilled for her and discussed again today.  Hypothyroidism due to Hashimoto thyroiditis Will ensure her thyroid levels are normal considering her acute weakness - TSH - T4, free  Vitamin D deficiency - Vitamin D (25 hydroxy) Elevated hemoglobin A1c - Hemoglobin A1c  Muscle weakness/fatigue Possibly multifactorial versus her still needing time to get over acute illness 3-4 weeks ago.  Lung exam today is normal. - Vitamin D (25 hydroxy) - B12 and Folate Panel - Comp Met (CMET) - CBC w/Diff - IBC + Ferritin  Neuropathy Patient has an atypical neuropathy/paresthesia symptoms greater than a year and a very localized location of her left upper thoracic area.  She has been tried on medications and topicals none of which have completely resolved the issue.  She recently started TheraFlu nerve relief product and it helps some.  She states this can become rather problematic at times and would like to proceed now with further evaluation with neurology.  She now feels this is affecting her daily life and she is even avoiding travel plans secondary  to discomfort. - Ambulatory referral to Neurology  Squamous cell skin cancer - Ambulatory referral to Dermatology   Return in about 27 weeks (around 02/15/2024) for cpe (20 min), Routine chronic condition follow-up.   Orders Placed This Encounter  Procedures   TSH   T4, free   Vitamin D (25 hydroxy)   B12 and Folate Panel   Comp Met (CMET)   CBC w/Diff   IBC + Ferritin   Hemoglobin A1c   Ambulatory referral to Neurology   Ambulatory referral to Dermatology   Meds ordered this encounter   Medications   alendronate (FOSAMAX) 70 MG tablet    Sig: Take 1 tablet (70 mg total) by mouth every 7 (seven) days. Take with a full glass of water on an empty stomach.    Dispense:  4 tablet    Refill:  11   traZODone (DESYREL) 50 MG tablet    Sig: Take 0.5-1 tablets (25-50 mg total) by mouth at bedtime as needed for sleep.    Dispense:  90 tablet    Refill:  1   Vitamin D, Ergocalciferol, (DRISDOL) 1.25 MG (50000 UNIT) CAPS capsule    Sig: Take 1 capsule (50,000 Units total) by mouth every 14 (fourteen) days.    Dispense:  6 capsule    Refill:  4   Referral Orders         Ambulatory referral to Neurology         Ambulatory referral to Dermatology       Electronically signed by: Felix Pacini, DO [facility] [place]

## 2023-08-10 NOTE — Patient Instructions (Signed)
 Return in about 27 weeks (around 02/15/2024) for cpe (20 min), Routine chronic condition follow-up.        Great to see you today.  I have refilled the medication(s) we provide.   If labs were collected or images ordered, we will inform you of  results once we have received them and reviewed. We will contact you either by echart message, or telephone call.  Please give ample time to the testing facility, and our office to run,  receive and review results. Please do not call inquiring of results, even if you can see them in your chart. We will contact you as soon as we are able. If it has been over 1 week since the test was completed, and you have not yet heard from [place], then please call us.    - echart message- for normal results that have been seen by the patient already.   - telephone call: abnormal results or if patient has not viewed results in their echart.  If a referral to a specialist was entered for you, please call us in 2 weeks if you have not heard from the specialist office to schedule.

## 2023-08-11 LAB — B12 AND FOLATE PANEL
Folate: >25.2 ng/mL (ref >5.9)
Vitamin B-12: 403 pg/mL (ref 211–911)

## 2023-08-11 LAB — TSH: TSH: 0.67 u[IU]/mL (ref 0.35–5.50)

## 2023-08-11 LAB — IBC + FERRITIN
Ferritin: 42.1 ng/mL (ref 10.0–291.0)
Iron: 104 ug/dL (ref 42–145)
Saturation Ratios: 31.2 % (ref 20.0–50.0)
TIBC: 333.2 ug/dL (ref 250.0–450.0)
Transferrin: 238 mg/dL (ref 212.0–360.0)

## 2023-08-11 LAB — VITAMIN D 25 HYDROXY (VIT D DEFICIENCY, FRACTURES): VITD: 41.29 ng/mL (ref 30.00–100.00)

## 2023-08-11 LAB — T4, FREE: Free T4: 1.15 ng/dL (ref 0.60–1.60)

## 2023-08-12 ENCOUNTER — Encounter: Payer: Self-pay | Admitting: Family Medicine

## 2023-09-26 ENCOUNTER — Ambulatory Visit: Admitting: Neurology

## 2023-09-26 ENCOUNTER — Encounter: Payer: Self-pay | Admitting: Neurology

## 2023-09-26 VITALS — BP 132/85 | HR 75 | Ht 63.0 in | Wt 177.0 lb

## 2023-09-26 DIAGNOSIS — R202 Paresthesia of skin: Secondary | ICD-10-CM

## 2023-09-26 NOTE — Progress Notes (Signed)
 [facility] Clinic Note - Initial Visit   Date: 09/26/2023   Alexis Beard MRN: [id number] DOB: [birth year]   Dear Dr. Marylee Snowball:  Thank you for your kind referral of Alexis Beard for consultation of numbness/tingling. Although her history is well known to you, please allow us  to reiterate it for the purpose of our medical record. The patient was accompanied to the clinic by self.    Alexis Beard is a 78 y.o. right-handed female with hypothyroidism and osteoporosis presenting for evaluation of numbness/tingling.   IMPRESSION/PLAN: Possible notalgia paresthetica involving the left shoulder region.  Discussed that management is symptomatic.  PT for back strengthening was offered, she will think about this.  MRI thoracic spine can be ordered is symptoms get worse, if there is no improvement with PT.  Medications such as gabapentin offered and she prefers topical OTC lidocaine ointment as she is doing.   Return to clinic as needed  ------------------------------------------------------------- History of present illness: Starting around 2024, she began having tingling involving the left shoulder blade.  She has stinging pain involving the region, which lasts about 5 minutes.  It occurs almost daily.  She applied lidocaine roll-in which provides relief.  She has tried a lidocaine patch but has better relief with roll-in.  No weakness.   Out-side paper records, electronic medical record, and images have been reviewed where available and summarized as:  Lab Results  Component Value Date   HGBA1C 5.9 08/10/2023   Lab Results  Component Value Date   VITAMINB12 403 08/10/2023   Lab Results  Component Value Date   TSH 0.67 08/10/2023   No results found for: "ESRSEDRATE", "POCTSEDRATE"  Past Medical History:  Diagnosis Date   Anemia    Arthritis    Basal cell carcinoma (BCC)    Basal cell Carcinoma skin CA, on foot   Cataract    Colon polyps     COVID-19 01/29/2021   Diverticulosis    GERD (gastroesophageal reflux disease)    OCC   Hypertension    Hypothyroid    Migraines    Osteoporosis     Past Surgical History:  Procedure Laterality Date   BASAL CELL CARCINOMA EXCISION  09/2015   Back   birth mark removal  1998   on (R) side of neck   CARPAL TUNNEL RELEASE  1998   COLONOSCOPY     TONSILLECTOMY  1956     Medications:  Outpatient Encounter Medications as of 09/26/2023  Medication Sig   alendronate  (FOSAMAX ) 70 MG tablet Take 1 tablet (70 mg total) by mouth every 7 (seven) days. Take with a full glass of water on an empty stomach.   clotrimazole  (CLOTRIMAZOLE  ANTI-FUNGAL) 1 % cream Apply 1 Application topically 2 (two) times daily.   feeding supplement (BOOST HIGH PROTEIN) LIQD Take 1 Container by mouth every other day.   Polyethylene Glycol 3350 (MIRALAX PO) Take by mouth. Takes weekly   SYNTHROID  100 MCG tablet Take 1 tablet (100 mcg total) by mouth daily before breakfast.   traZODone  (DESYREL ) 50 MG tablet Take 0.5-1 tablets (25-50 mg total) by mouth at bedtime as needed for sleep.   triamcinolone  cream (KENALOG ) 0.1 % Apply 1 Application topically daily.   Vitamin D , Ergocalciferol , (DRISDOL ) 1.25 MG (50000 UNIT) CAPS capsule Take 1 capsule (50,000 Units total) by mouth every 14 (fourteen) days.   No facility-administered encounter medications on file as of 09/26/2023.    Allergies:  Allergies  Allergen Reactions  Codeine Nausea And Vomiting    Family History: Family History  Problem Relation Age of Onset   Diabetes Mother    Thyroid  disease Mother    Heart disease Mother 20   Hyperlipidemia Father    Heart disease Father 1   Hyperlipidemia Sister    Cancer Maternal Aunt        Breast Cancer   Thyroid  disease Maternal Aunt    Melanoma Maternal Aunt    Colon cancer Neg Hx    Esophageal cancer Neg Hx    Rectal cancer Neg Hx    Stomach cancer Neg Hx     Social History: Social History   Tobacco  Use   Smoking status: Never   Smokeless tobacco: Never  Vaping Use   Vaping status: Never Used  Substance Use Topics   Alcohol use: No   Drug use: No   Social History   Social History Narrative   Married to Start, has children.   High school graduate.   Drinks caffeine.   Wears her seatbelt, wears a bicycle helmet.   Wears glasses.   Smoke detector in the home, firearms locked in the home.   Feels safe in her relationships.   Right handed.         Are you right handed or left handed? Right Handed    Are you currently employed ? No    What is your current occupation ?   Do you live at home alone? No   Who lives with you? Husband - Alexis Beard. Dominica Friends.    What type of home do you live in: 1 story or 2 story? Lives in one story home.         Vital Signs:  BP 132/85   Pulse 75   Ht 5\' 3"  (1.6 m)   Wt 177 lb (80.3 kg)   SpO2 98%   BMI 31.35 kg/m    Neurological Exam: MENTAL STATUS including orientation to time, place, person, recent and remote memory, attention span and concentration, language, and fund of knowledge is normal.  Speech is not dysarthric.  CRANIAL NERVES: II:  No visual field defects.     III-IV-VI: Pupils equal round and reactive to light.  Normal conjugate, extra-ocular eye movements in all directions of gaze.  No nystagmus.  No ptosis.   V:  Normal facial sensation.    VII:  Normal facial symmetry and movements.   VIII:  Normal hearing and vestibular function.   IX-X:  Normal palatal movement.   XI:  Normal shoulder shrug and head rotation.   XII:  Normal tongue strength and range of motion, no deviation or fasciculation.  MOTOR:  No atrophy, fasciculations or abnormal movements.  No pronator drift.   Upper Extremity:  Right  Left  Deltoid  5/5   5/5   Biceps  5/5   5/5   Triceps  5/5   5/5   Infraspinatus 5/5  5/5  Medial pectoralis 5/5  5/5  Wrist extensors  5/5   5/5   Wrist flexors  5/5   5/5   Finger extensors  5/5   5/5   Finger  flexors  5/5   5/5   Dorsal interossei  5/5   5/5   Abductor pollicis  5/5   5/5   Tone (Ashworth scale)  0  0   Lower Extremity:  Right  Left  Hip flexors  5/5   5/5   Knee flexors  5/5  5/5   Knee extensors  5/5   5/5   Dorsiflexors  5/5   5/5   Plantarflexors  5/5   5/5   Toe extensors  5/5   5/5   Toe flexors  5/5   5/5   Tone (Ashworth scale)  0  0   MSRs:                                           Right        Left brachioradialis 2+  2+  biceps 2+  2+  triceps 2+  2+  patellar 2+  2+  ankle jerk 2+  2+  Hoffman no  no  plantar response down  down   SENSORY:  Normal and symmetric perception of light touch, pinprick, vibration, and temperature.  She has hyperesthesia involving the medial border of the left scapula.      COORDINATION/GAIT: Normal finger-to- nose-finger.  Intact rapid alternating movements bilaterally.  Gait narrow based and stable.    Thank you for allowing me to participate in patient's care.  If I can answer any additional questions, I would be pleased to do so.    Sincerely,    Reily Ilic K. Lydia Sams, DO

## 2023-10-19 DIAGNOSIS — Z961 Presence of intraocular lens: Secondary | ICD-10-CM | POA: Diagnosis not present

## 2023-10-19 DIAGNOSIS — H52223 Regular astigmatism, bilateral: Secondary | ICD-10-CM | POA: Diagnosis not present

## 2023-10-19 DIAGNOSIS — H524 Presbyopia: Secondary | ICD-10-CM | POA: Diagnosis not present

## 2023-10-19 DIAGNOSIS — H5213 Myopia, bilateral: Secondary | ICD-10-CM | POA: Diagnosis not present

## 2023-10-19 DIAGNOSIS — H43812 Vitreous degeneration, left eye: Secondary | ICD-10-CM | POA: Diagnosis not present

## 2023-10-26 ENCOUNTER — Ambulatory Visit: Admitting: Dermatology

## 2023-10-26 ENCOUNTER — Encounter: Payer: Self-pay | Admitting: Dermatology

## 2023-10-26 VITALS — BP 132/75 | HR 86

## 2023-10-26 DIAGNOSIS — R202 Paresthesia of skin: Secondary | ICD-10-CM | POA: Diagnosis not present

## 2023-10-26 DIAGNOSIS — L821 Other seborrheic keratosis: Secondary | ICD-10-CM | POA: Diagnosis not present

## 2023-10-26 DIAGNOSIS — D1801 Hemangioma of skin and subcutaneous tissue: Secondary | ICD-10-CM

## 2023-10-26 DIAGNOSIS — L578 Other skin changes due to chronic exposure to nonionizing radiation: Secondary | ICD-10-CM

## 2023-10-26 DIAGNOSIS — W908XXA Exposure to other nonionizing radiation, initial encounter: Secondary | ICD-10-CM | POA: Diagnosis not present

## 2023-10-26 DIAGNOSIS — Z85828 Personal history of other malignant neoplasm of skin: Secondary | ICD-10-CM | POA: Diagnosis not present

## 2023-10-26 DIAGNOSIS — Z1283 Encounter for screening for malignant neoplasm of skin: Secondary | ICD-10-CM | POA: Diagnosis not present

## 2023-10-26 DIAGNOSIS — L814 Other melanin hyperpigmentation: Secondary | ICD-10-CM | POA: Diagnosis not present

## 2023-10-26 DIAGNOSIS — D229 Melanocytic nevi, unspecified: Secondary | ICD-10-CM

## 2023-10-26 NOTE — Patient Instructions (Signed)
 Sarna Lotion with Menthol

## 2023-10-26 NOTE — Progress Notes (Signed)
   New Patient Visit   Subjective  Alexis Beard is a 78 y.o. female who presents for the following: Skin Cancer Screening and Full Body Skin Exam  The patient presents for Total-Body Skin Exam (TBSE) for skin cancer screening and mole check. The patient has spots, moles and lesions to be evaluated, some may be new or changing.  The following portions of the chart were reviewed this encounter and updated as appropriate: medications, allergies, medical history  Review of Systems:  No other skin or systemic complaints except as noted in HPI or Assessment and Plan.  Objective  Well appearing patient in no apparent distress; mood and affect are within normal limits.  A full examination was performed including scalp, head, eyes, ears, nose, lips, neck, chest, axillae, abdomen, back, buttocks, bilateral upper extremities, bilateral lower extremities, hands, feet, fingers, toes, fingernails, and toenails. All findings within normal limits unless otherwise noted below.   Relevant physical exam findings are noted in the Assessment and Plan.    Assessment & Plan   SKIN CANCER SCREENING PERFORMED TODAY.  ACTINIC DAMAGE - Chronic condition, secondary to cumulative UV/sun exposure - diffuse scaly erythematous macules with underlying dyspigmentation - Recommend daily broad spectrum sunscreen SPF 30+ to sun-exposed areas, reapply every 2 hours as needed.  - Staying in the shade or wearing long sleeves, sun glasses (UVA+UVB protection) and wide brim hats (4-inch brim around the entire circumference of the hat) are also recommended for sun protection.  - Call for new or changing lesions.  LENTIGINES, SEBORRHEIC KERATOSES, HEMANGIOMAS - Benign normal skin lesions - Benign-appearing - Call for any changes  MELANOCYTIC NEVI - Tan-brown and/or pink-flesh-colored symmetric macules and papules - Benign appearing on exam today - Observation - Call clinic for new or changing moles - Recommend daily  use of broad spectrum spf 30+ sunscreen to sun-exposed areas.   HISTORY OF BASAL CELL CARCINOMA OF THE SKIN - No evidence of recurrence today - Recommend regular full body skin exams - Recommend daily broad spectrum sunscreen SPF 30+ to sun-exposed areas, reapply every 2 hours as needed.  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 Chronic rubbing or scratching causes darkening of the skin.  OTC treatments which can help with itch include numbing creams like pramoxine or lidocaine which temporarily reduce itch  Sarna Lotion with Menthol in the fridge  Return in about 6 months (around 04/26/2024) for TBSE.  I, Haig Levan, Surg Tech III, am acting as scribe for Deneise Finlay, MD.   Documentation: I have reviewed the above documentation for accuracy and completeness, and I agree with the above.  Deneise Finlay, MD

## 2023-11-24 DIAGNOSIS — Z8249 Family history of ischemic heart disease and other diseases of the circulatory system: Secondary | ICD-10-CM | POA: Diagnosis not present

## 2023-11-24 DIAGNOSIS — E785 Hyperlipidemia, unspecified: Secondary | ICD-10-CM | POA: Diagnosis not present

## 2023-11-24 DIAGNOSIS — G4700 Insomnia, unspecified: Secondary | ICD-10-CM | POA: Diagnosis not present

## 2023-11-24 DIAGNOSIS — I739 Peripheral vascular disease, unspecified: Secondary | ICD-10-CM | POA: Diagnosis not present

## 2023-11-24 DIAGNOSIS — M1990 Unspecified osteoarthritis, unspecified site: Secondary | ICD-10-CM | POA: Diagnosis not present

## 2023-11-24 DIAGNOSIS — E039 Hypothyroidism, unspecified: Secondary | ICD-10-CM | POA: Diagnosis not present

## 2023-11-24 DIAGNOSIS — Z833 Family history of diabetes mellitus: Secondary | ICD-10-CM | POA: Diagnosis not present

## 2023-11-24 DIAGNOSIS — K219 Gastro-esophageal reflux disease without esophagitis: Secondary | ICD-10-CM | POA: Diagnosis not present

## 2023-11-24 DIAGNOSIS — E669 Obesity, unspecified: Secondary | ICD-10-CM | POA: Diagnosis not present

## 2023-11-24 DIAGNOSIS — M810 Age-related osteoporosis without current pathological fracture: Secondary | ICD-10-CM | POA: Diagnosis not present

## 2023-11-24 DIAGNOSIS — G629 Polyneuropathy, unspecified: Secondary | ICD-10-CM | POA: Diagnosis not present

## 2023-11-24 DIAGNOSIS — I10 Essential (primary) hypertension: Secondary | ICD-10-CM | POA: Diagnosis not present

## 2024-02-12 ENCOUNTER — Other Ambulatory Visit: Payer: Self-pay | Admitting: Family Medicine

## 2024-02-12 DIAGNOSIS — E039 Hypothyroidism, unspecified: Secondary | ICD-10-CM

## 2024-02-13 ENCOUNTER — Other Ambulatory Visit: Payer: Self-pay | Admitting: Family Medicine

## 2024-02-15 ENCOUNTER — Encounter: Admitting: Family Medicine

## 2024-02-15 DIAGNOSIS — E039 Hypothyroidism, unspecified: Secondary | ICD-10-CM

## 2024-02-23 NOTE — Progress Notes (Signed)
 Alexis Beard                                          MRN: [id number]   02/23/2024   The [facility] Quality Team Specialist reviewed this patient medical record for the purposes of chart review for care gap closure. The following were reviewed: abstraction for care gap closure-controlling blood pressure.    [facility] Quality Team

## 2024-03-05 ENCOUNTER — Encounter: Payer: Self-pay | Admitting: Family Medicine

## 2024-03-05 ENCOUNTER — Ambulatory Visit (INDEPENDENT_AMBULATORY_CARE_PROVIDER_SITE_OTHER): Admitting: Family Medicine

## 2024-03-05 VITALS — BP 128/78 | HR 81 | Temp 98.3°F | Ht 63.0 in | Wt 178.0 lb

## 2024-03-05 DIAGNOSIS — E782 Mixed hyperlipidemia: Secondary | ICD-10-CM | POA: Diagnosis not present

## 2024-03-05 DIAGNOSIS — R7309 Other abnormal glucose: Secondary | ICD-10-CM

## 2024-03-05 DIAGNOSIS — M810 Age-related osteoporosis without current pathological fracture: Secondary | ICD-10-CM

## 2024-03-05 DIAGNOSIS — Z0000 Encounter for general adult medical examination without abnormal findings: Secondary | ICD-10-CM

## 2024-03-05 DIAGNOSIS — E559 Vitamin D deficiency, unspecified: Secondary | ICD-10-CM | POA: Diagnosis not present

## 2024-03-05 DIAGNOSIS — G479 Sleep disorder, unspecified: Secondary | ICD-10-CM

## 2024-03-05 DIAGNOSIS — E039 Hypothyroidism, unspecified: Secondary | ICD-10-CM | POA: Diagnosis not present

## 2024-03-05 DIAGNOSIS — E669 Obesity, unspecified: Secondary | ICD-10-CM | POA: Diagnosis not present

## 2024-03-05 LAB — COMPREHENSIVE METABOLIC PANEL WITH GFR
ALT: 10 U/L (ref 0–35)
AST: 12 U/L (ref 0–37)
Albumin: 4.3 g/dL (ref 3.5–5.2)
Alkaline Phosphatase: 67 U/L (ref 39–117)
BUN: 11 mg/dL (ref 6–23)
CO2: 30 meq/L (ref 19–32)
Calcium: 9.3 mg/dL (ref 8.4–10.5)
Chloride: 102 meq/L (ref 96–112)
Creatinine, Ser: 0.78 mg/dL (ref 0.40–1.20)
GFR: 72.92 mL/min (ref >60.00)
Glucose, Bld: 94 mg/dL (ref 70–99)
Potassium: 4.5 meq/L (ref 3.5–5.1)
Sodium: 140 meq/L (ref 135–145)
Total Bilirubin: 0.6 mg/dL (ref 0.2–1.2)
Total Protein: 6.9 g/dL (ref 6.0–8.3)

## 2024-03-05 LAB — CBC
HCT: 41.9 % (ref 36.0–46.0)
Hemoglobin: 13.8 g/dL (ref 12.0–15.0)
MCHC: 32.9 g/dL (ref 30.0–36.0)
MCV: 89.5 fl (ref 78.0–100.0)
Platelets: 276 K/uL (ref 150.0–400.0)
RBC: 4.68 Mil/uL (ref 3.87–5.11)
RDW: 14.3 % (ref 11.5–15.5)
WBC: 6.3 K/uL (ref 4.0–10.5)

## 2024-03-05 LAB — HEMOGLOBIN A1C: Hgb A1c MFr Bld: 5.9 % (ref 4.6–6.5)

## 2024-03-05 LAB — LIPID PANEL
Cholesterol: 208 mg/dL — ABNORMAL HIGH (ref 0–200)
HDL: 63.6 mg/dL (ref >39.00)
LDL Cholesterol: 104 mg/dL — ABNORMAL HIGH (ref 0–99)
NonHDL: 144.84
Total CHOL/HDL Ratio: 3
Triglycerides: 205 mg/dL — ABNORMAL HIGH (ref 0.0–149.0)
VLDL: 41 mg/dL — ABNORMAL HIGH (ref 0.0–40.0)

## 2024-03-05 LAB — VITAMIN D 25 HYDROXY (VIT D DEFICIENCY, FRACTURES): VITD: 38.79 ng/mL (ref 30.00–100.00)

## 2024-03-05 LAB — TSH: TSH: 3.62 u[IU]/mL (ref 0.35–5.50)

## 2024-03-05 MED ORDER — TRAZODONE HCL 50 MG PO TABS: 25.0000 mg | ORAL_TABLET | Freq: Every evening | ORAL | 1 refills | Status: AC | PRN

## 2024-03-05 MED ORDER — VITAMIN D (ERGOCALCIFEROL) 1.25 MG (50000 UNIT) PO CAPS: 50000.0000 [IU] | ORAL_CAPSULE | ORAL | 4 refills | Status: AC

## 2024-03-05 MED ORDER — ALENDRONATE SODIUM 70 MG PO TABS
70.0000 mg | ORAL_TABLET | ORAL | 11 refills | Status: AC
Start: 2024-03-05 — End: ?

## 2024-03-05 NOTE — Progress Notes (Signed)
 Patient ID: Alexis Beard, female  DOB: 1945/08/19, 78 y.o.   MRN: [id number] Patient Care Team    Relationship Specialty Notifications Start End  Alexis Beard LABOR, DO PCP - General Family Medicine  12/20/16   Alexis Doffing, MD Consulting Physician Dermatology  12/22/16   Alexis Tonita POUR, DO Consulting Physician Neurology  09/26/23     Chief Complaint  Patient presents with   Annual Exam    Pt is fasting.  Chronic condition management Influenza vaccine- declined    Subjective: Alexis Beard is a 78 y.o.  Female  present for CPE and chronic Conditions/illness Management All past medical history, surgical history, allergies, family history, immunizations, medications and social history were updated in the electronic medical record today. All recent labs, ED visits and hospitalizations within the last year were reviewed. Patient reports she is staying active, traveling a lot camper. Health maintenance:  Mammogram: completed 01/2019, > pt declined further screen Immunizations: tdap -has been printed, influenza declined(encouraged yearly), PNA series completed, Shingrix declined  Infectious disease screening: Hep C screening completed DEXA: last completed 01/2019 T score -4.2 osteoporosis.>>ordered again [facility] st Patient has a Dental home. Hospitalizations/ED visits: Reviewed  Acquired hypothyroidism Patient reports compliance with Synthroid  (name brand) 100 mcg daily on an empty stomach.  No complaints   Sleep disturbance: Patient reports compliance  with  trazodone  and sleep pattern is controlled with medication.  She typically takes a half a tab/25 mg nightly but occasionally will take a whole tablet 50 mg nightly.   Osteoporosis: Patient been prescribed Fosamax  70 mg once weekly, she has not been taking this medication.   She is compliant with high-dose 50,000 units ergocalciferol  every 14 days.       03/05/2024    9:35 AM 08/10/2023   10:27 AM 04/13/2023   11:06 AM  02/14/2023    9:40 AM 05/12/2022    1:48 PM  Depression screen PHQ 2/9  Decreased Interest 0 0 0 0 0  Down, Depressed, Hopeless 0 0 0 0 0  PHQ - 2 Score 0 0 0 0 0  Altered sleeping 1 1 1 1    Tired, decreased energy 0 2 0 1   Change in appetite 0 1 0 0   Feeling bad or failure about yourself  0 0 0 0   Trouble concentrating 0 0 0 0   Moving slowly or fidgety/restless 0 0 0 0   Suicidal thoughts 0 0 0 0   PHQ-9 Score 1 4 1 2    Difficult doing work/chores Not difficult at all Somewhat difficult Not difficult at all Not difficult at all       03/05/2024    9:35 AM 08/10/2023   10:28 AM 02/14/2023    9:40 AM 01/27/2021   10:12 AM  GAD 7 : Generalized Anxiety Score  Nervous, Anxious, on Edge 1 0 1 0  Control/stop worrying 0 0 1 0  Worry too much - different things 0 0 1 1  Trouble relaxing 0 1 1 1   Restless 0 1 1 1   Easily annoyed or irritable 0 0 0 0  Afraid - awful might happen 0 0 0 0  Total GAD 7 Score 1 2 5 3   Anxiety Difficulty Not difficult at all Somewhat difficult Somewhat difficult       02/14/2023    9:39 AM 04/13/2023   10:58 AM 08/10/2023   10:27 AM 09/26/2023   11:12 AM 03/05/2024    9:35 AM  Fall Risk  Falls in the past year? 0 0 0 0 0  Was there an injury with Fall? 0 0  0 0  Fall Risk Category Calculator 0 0  0 0  Patient at Risk for Falls Due to No Fall Risks      Fall risk Follow up Falls evaluation completed Falls evaluation completed;Education provided;Falls prevention discussed Falls evaluation completed Falls evaluation completed Falls evaluation completed    Immunization History  Administered Date(s) Administered   INFLUENZA, HIGH DOSE SEASONAL PF 04/11/2018   Pneumococcal Conjugate-13 08/25/2015   Pneumococcal Polysaccharide-23 02/19/2020   Tdap 08/27/2012   Past Medical History:  Diagnosis Date   Anemia    Arthritis    Basal cell carcinoma (BCC)    Basal cell Carcinoma skin CA, on foot   Cataract    Colon polyps    COVID-19 01/29/2021    Diverticulosis    GERD (gastroesophageal reflux disease)    OCC   Hypertension    Hypothyroid    Migraines    Osteoporosis    Allergies  Allergen Reactions   Codeine Nausea And Vomiting   Past Surgical History:  Procedure Laterality Date   BASAL CELL CARCINOMA EXCISION  09/2015   Back   birth mark removal  1998   on (R) side of neck   CARPAL TUNNEL RELEASE  1998   COLONOSCOPY     TONSILLECTOMY  1956   Family History  Problem Relation Age of Onset   Diabetes Mother    Thyroid  disease Mother    Heart disease Mother 13   Hyperlipidemia Father    Heart disease Father 61   Hyperlipidemia Sister    Cancer Maternal Aunt        Breast Cancer   Thyroid  disease Maternal Aunt    Melanoma Maternal Aunt    Colon cancer Neg Hx    Esophageal cancer Neg Hx    Rectal cancer Neg Hx    Stomach cancer Neg Hx    Social History   Social History Narrative   Married to Alexis Beard, has children.   High school graduate.   Drinks caffeine.   Wears her seatbelt, wears a bicycle helmet.   Wears glasses.   Smoke detector in the home, firearms locked in the home.   Feels safe in her relationships.   Right handed.         Are you right handed or left handed? Right Handed    Are you currently employed ? No    What is your current occupation ?   Do you live at home alone? No   Who lives with you? Husband - Alexis Beard. Alexis Beard.    What type of home do you live in: 1 story or 2 story? Lives in one story home.         Allergies as of 03/05/2024       Reactions   Codeine Nausea And Vomiting        Medication List        Accurate as of March 05, 2024 10:00 AM. If you have any questions, ask your nurse or doctor.          STOP taking these medications    clotrimazole  1 % cream Commonly known as: Clotrimazole  Anti-Fungal Stopped by: Alexis Beard   feeding supplement Liqd Stopped by: Alexis Beard   MIRALAX PO Stopped by: Alexis Beard   triamcinolone  cream 0.1 % Commonly  known as: KENALOG  Stopped by: Alexis Beard  TAKE these medications    alendronate  70 MG tablet Commonly known as: FOSAMAX  Take 1 tablet (70 mg total) by mouth every 7 (seven) days. Take with a full glass of water on an empty stomach.   Synthroid  100 MCG tablet Generic drug: levothyroxine  Take 1 tablet (100 mcg total) by mouth daily before breakfast.   traZODone  50 MG tablet Commonly known as: DESYREL  Take 0.5-1 tablets (25-50 mg total) by mouth at bedtime as needed for sleep.   Vitamin D  (Ergocalciferol ) 1.25 MG (50000 UNIT) Caps capsule Commonly known as: DRISDOL  Take 1 capsule (50,000 Units total) by mouth every 14 (fourteen) days.       All past medical history, surgical history, allergies, family history, immunizations andmedications were updated in the EMR today and reviewed under the history and medication portions of their EMR.     No results found for this or any previous visit (from the past 2160 hours).    Review of Systems  All other systems reviewed and are negative.  14 pt review of systems performed and negative (unless mentioned in an HPI)  Objective: BP 128/78   Pulse 81   Temp 98.3 F (36.8 C)   Ht 5' 3 (1.6 m)   Wt 178 lb (80.7 kg)   SpO2 97%   BMI 31.53 kg/m  Physical Exam Vitals and nursing note reviewed.  Constitutional:      General: She is not in acute distress.    Appearance: Normal appearance. She is not ill-appearing, toxic-appearing or diaphoretic.  HENT:     Head: Normocephalic and atraumatic.     Right Ear: Tympanic membrane, ear canal and external ear normal. There is no impacted cerumen.     Left Ear: Tympanic membrane, ear canal and external ear normal. There is no impacted cerumen.     Nose: No congestion or rhinorrhea.     Mouth/Throat:     Mouth: Mucous membranes are moist.     Pharynx: Oropharynx is clear. No oropharyngeal exudate or posterior oropharyngeal erythema.  Eyes:     General: No scleral icterus.        Right eye: No discharge.        Left eye: No discharge.     Extraocular Movements: Extraocular movements intact.     Conjunctiva/sclera: Conjunctivae normal.     Pupils: Pupils are equal, round, and reactive to light.  Cardiovascular:     Rate and Rhythm: Normal rate and regular rhythm.     Pulses: Normal pulses.     Heart sounds: Normal heart sounds. No murmur heard.    No friction rub. No gallop.  Pulmonary:     Effort: Pulmonary effort is normal. No respiratory distress.     Breath sounds: Normal breath sounds. No stridor. No wheezing, rhonchi or rales.  Chest:     Chest wall: No tenderness.  Abdominal:     General: Abdomen is flat. Bowel sounds are normal. There is no distension.     Palpations: Abdomen is soft. There is no mass.     Tenderness: There is no abdominal tenderness. There is no right CVA tenderness, left CVA tenderness, guarding or rebound.     Hernia: No hernia is present.  Musculoskeletal:        General: No swelling, tenderness or deformity. Normal range of motion.     Cervical back: Normal range of motion and neck supple. No rigidity or tenderness.     Right lower leg: No edema.     Left  lower leg: No edema.  Lymphadenopathy:     Cervical: No cervical adenopathy.  Skin:    General: Skin is warm and dry.     Coloration: Skin is not jaundiced or pale.     Findings: No bruising, erythema, lesion or rash.  Neurological:     General: No focal deficit present.     Mental Status: She is alert and oriented to person, place, and time. Mental status is at baseline.     Cranial Nerves: No cranial nerve deficit.     Sensory: No sensory deficit.     Motor: No weakness.     Coordination: Coordination normal.     Gait: Gait normal.     Deep Tendon Reflexes: Reflexes normal.  Psychiatric:        Mood and Affect: Mood normal.        Behavior: Behavior normal.        Thought Content: Thought content normal.        Judgment: Judgment normal.      No results  found.  Assessment/plan: Alexis Beard is a 78 y.o. female present for CPE and chronic condition management  Hypothyroidism due to acquired atrophy of thyroid  Continue synthroid  100 mcg. refills will be provided in appropriate dose based on lab result today Labs collected today  E66.01 (HCC)/HLD Patient had been on amlodipine  and HCTZ, in the past for hypertension, she has been able to discontinue without needing to restart medication. Monitor BP   Sleep disturbance Stable Continue trazodone  25-50 m qhs prn  Osteoporosis without current pathological fracture, unspecified osteoporosis type/vit d def -Vitamin D  levels collected today - DG Bone Density; Future-ordered again.  Patient would like retesting. Continue Fosamax   Elevated hemoglobin A1c A1c collected today  Routine general medical examination at a health care facility (Primary) Mammogram: completed 01/2019, > pt declined further screen Immunizations: tdap -has been printed, influenza declined(encouraged yearly), PNA series completed, Shingrix declined  Infectious disease screening: Hep C screening completed DEXA: last completed 01/2019 T score -4.2 osteoporosis.>>ordered again [facility] st Patient was encouraged to exercise greater than 150 minutes a week. Patient was encouraged to choose a diet filled with fresh fruits and vegetables, and lean meats. AVS provided to patient today for education/recommendation on gender specific health and safety maintenance.  Return in about 25 weeks (around 08/27/2024) for Routine chronic condition follow-up.   Orders Placed This Encounter  Procedures   DG Bone Density   CBC   Comprehensive metabolic panel with GFR   Hemoglobin A1c   Lipid panel   TSH   Vitamin D  (25 hydroxy)   Meds ordered this encounter  Medications   traZODone  (DESYREL ) 50 MG tablet    Sig: Take 0.5-1 tablets (25-50 mg total) by mouth at bedtime as needed for sleep.    Dispense:  90 tablet    Refill:  1    Vitamin D , Ergocalciferol , (DRISDOL ) 1.25 MG (50000 UNIT) CAPS capsule    Sig: Take 1 capsule (50,000 Units total) by mouth every 14 (fourteen) days.    Dispense:  6 capsule    Refill:  4   alendronate  (FOSAMAX ) 70 MG tablet    Sig: Take 1 tablet (70 mg total) by mouth every 7 (seven) days. Take with a full glass of water on an empty stomach.    Dispense:  4 tablet    Refill:  11   Referral Orders  No referral(s) requested today      Electronically signed by: Beard  Illeana Edick, DO [facility] [place]

## 2024-03-05 NOTE — Patient Instructions (Addendum)
 Return in about 25 weeks (around 08/27/2024) for Routine chronic condition follow-up.        Great to see you today.  I have refilled the medication(s) we provide.   If labs were collected or images ordered, we will inform you of  results once we have received them and reviewed. We will contact you either by echart message, or telephone call.  Please give ample time to the testing facility, and our office to run,  receive and review results. Please do not call inquiring of results, even if you can see them in your chart. We will contact you as soon as we are able. If it has been over 1 week since the test was completed, and you have not yet heard from us , then please call us .    - echart message- for normal results that have been seen by the patient already.   - telephone call: abnormal results or if patient has not viewed results in their echart.  If a referral to a specialist was entered for you, please call us  in 2 weeks if you have not heard from the specialist office to schedule.

## 2024-03-06 ENCOUNTER — Telehealth: Payer: Self-pay

## 2024-03-06 ENCOUNTER — Ambulatory Visit: Payer: Self-pay | Admitting: Family Medicine

## 2024-03-06 DIAGNOSIS — E039 Hypothyroidism, unspecified: Secondary | ICD-10-CM

## 2024-03-06 MED ORDER — SYNTHROID 100 MCG PO TABS: 100.0000 ug | ORAL_TABLET | Freq: Every day | ORAL | 3 refills | Status: AC

## 2024-03-06 NOTE — Telephone Encounter (Signed)
 Communication  Reason for CRM: Patient called in regarding a missed call from the office, would like a callback   LVM to discuss. No one from [facility] tried to contact pt.

## 2024-04-26 ENCOUNTER — Ambulatory Visit: Admitting: Dermatology

## 2024-04-26 ENCOUNTER — Encounter: Payer: Self-pay | Admitting: Dermatology

## 2024-04-26 VITALS — BP 138/64 | HR 74

## 2024-04-26 DIAGNOSIS — L814 Other melanin hyperpigmentation: Secondary | ICD-10-CM | POA: Diagnosis not present

## 2024-04-26 DIAGNOSIS — R202 Paresthesia of skin: Secondary | ICD-10-CM | POA: Diagnosis not present

## 2024-04-26 DIAGNOSIS — L578 Other skin changes due to chronic exposure to nonionizing radiation: Secondary | ICD-10-CM

## 2024-04-26 DIAGNOSIS — L820 Inflamed seborrheic keratosis: Secondary | ICD-10-CM | POA: Diagnosis not present

## 2024-04-26 DIAGNOSIS — D1801 Hemangioma of skin and subcutaneous tissue: Secondary | ICD-10-CM | POA: Diagnosis not present

## 2024-04-26 DIAGNOSIS — L821 Other seborrheic keratosis: Secondary | ICD-10-CM | POA: Diagnosis not present

## 2024-04-26 DIAGNOSIS — D229 Melanocytic nevi, unspecified: Secondary | ICD-10-CM

## 2024-04-26 DIAGNOSIS — Z1283 Encounter for screening for malignant neoplasm of skin: Secondary | ICD-10-CM | POA: Diagnosis not present

## 2024-04-26 DIAGNOSIS — Z86007 Personal history of in-situ neoplasm of skin: Secondary | ICD-10-CM | POA: Diagnosis not present

## 2024-04-26 DIAGNOSIS — W908XXA Exposure to other nonionizing radiation, initial encounter: Secondary | ICD-10-CM

## 2024-04-26 DIAGNOSIS — Z85828 Personal history of other malignant neoplasm of skin: Secondary | ICD-10-CM | POA: Diagnosis not present

## 2024-04-26 NOTE — Patient Instructions (Signed)

## 2024-04-26 NOTE — Progress Notes (Signed)
 Total Body Skin Exam (TBSE) Visit   History of Present Illness Alexis Beard is a 78 year old female who presents for a skin check and evaluation of chronic itching on her back.  She experiences chronic itching on her back, which she reports has been persistent and was discussed with her neurologist. A neurologist recommended physical therapy, but she has not pursued this due to time constraints.  She has multiple seborrheic keratoses, including a spot on her scalp and another on the back of her head. However, she has a new spot near her eye that has been present for several months, which may be irritated by her glasses.  She reports multiple itchy spots on her body, believed to be exacerbated by friction from clothing. These spots are similar to the seborrheic keratosis on her scalp. She currently experiences no pain from these spots.  In terms of sun exposure, she frequently camps but tries to stay under an awning. She uses sunscreen on her face but sometimes forgets to apply it elsewhere, noting that her feet show signs of sun exposure as she often wears socks and flip flops.  Her family history includes her father having a significant spot on his head and her mother having similar skin issues, suggesting a possible genetic component to her skin conditions.  Patient presents today for follow up visit for TBSE. Patient was last evaluated on 10/26/23 . Patient denies medication changes. Patient reports she does have spots, moles and lesions of concern to be evaluated. Patient reports throughout her lifetime she has had moderate sun exposure. Currently, patient reports if she has excessive sun exposure, she does apply sunscreen and/or wears protective coverings. Patient reports she has hx of bx. She has had two areas on her right leg removed, but does not know the diagnosis. Patient admits to  family history of skin cancers. The patient has spots, moles and lesions to be evaluated, some may be  new or changing and the patient has concerns that these could be cancer.  Patient states that she has itching on her right shoulder blade. She has used biofreeze, lidocaine cream, and Sama Lotion with Menthol. She states the Biofreeze is the only thing that helps.    Patient has lesion on the posterior scalp that was pointed out by her hairdresser. Patient has noticed something itchy there, but has no pain.    The following portions of the chart were reviewed this encounter and updated as appropriate: medications, allergies, medical history  Review of Systems:  No other skin or systemic complaints except as noted in HPI or Assessment and Plan.  Objective  Well appearing patient in no apparent distress; mood and affect are within normal limits.  A full examination was performed including scalp, head, eyes, ears, nose, lips, neck, chest, axillae, abdomen, back, buttocks, bilateral upper extremities, bilateral lower extremities, hands, feet, fingers, toes, fingernails, and toenails. All findings within normal limits unless otherwise noted below.   Relevant physical exam findings are noted in the Assessment and Plan.    Assessment & Plan   LENTIGINES, SEBORRHEIC KERATOSES, HEMANGIOMAS - Benign normal skin lesions - Benign-appearing - Call for any changes  MELANOCYTIC NEVI - Tan-brown and/or pink-flesh-colored symmetric macules and papules - Benign appearing on exam today - Observation - Call clinic for new or changing moles - Recommend daily use of broad spectrum spf 30+ sunscreen to sun-exposed areas.   ACTINIC DAMAGE - Chronic condition, secondary to cumulative UV/sun exposure - diffuse scaly erythematous  macules with underlying dyspigmentation - Recommend daily broad spectrum sunscreen SPF 30+ to sun-exposed areas, reapply every 2 hours as needed.  - Staying in the shade or wearing long sleeves, sun glasses (UVA+UVB protection) and wide brim hats (4-inch brim around the entire  circumference of the hat) are also recommended for sun protection.  - Call for new or changing lesions.  HISTORY OF BASAL CELL CARCINOMA OF THE SKIN - No evidence of recurrence today - Recommend regular full body skin exams - Recommend daily broad spectrum sunscreen SPF 30+ to sun-exposed areas, reapply every 2 hours as needed.  - Call if any new or changing lesions are noted between office visits  HISTORY OF SQUAMOUS CELL CARCINOMA IN SITU OF THE SKIN - No evidence of recurrence today - Recommend regular full body skin exams - Recommend daily broad spectrum sunscreen SPF 30+ to sun-exposed areas, reapply every 2 hours as needed.  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 Chronic rubbing or scratching causes darkening of the skin.  OTC treatments which can help with itch include numbing creams like pramoxine or lidocaine which temporarily reduce itch  Discussed the benefits of a Neurologist or physical therapy    SKIN CANCER SCREENING PERFORMED TODAY. INFLAMED SEBORRHEIC KERATOSIS Left Malar Cheek Patient would like to wait until after the holidays before treating.  Return in about 1 month (around 05/27/2024) for spot check under left eye.  Alexis Rollene Gobble, RN, am acting as scribe for RUFUS CHRISTELLA HOLY, MD .   Documentation: I have reviewed the above documentation for accuracy and completeness, and I agree with the above.  RUFUS CHRISTELLA HOLY, MD

## 2024-05-02 ENCOUNTER — Telehealth: Payer: Self-pay

## 2024-05-02 NOTE — Telephone Encounter (Signed)
 Patient would like to cancel appointment for cryo for the ISK under her left eye. She reflected on her past experience of having LN2 on her chest and experienced shortness of breath the following night. She has concern due to the location and the proximity to her eye.  I reassured that we would shield her eye to provide protection.  We discussed that this is a benign lesion and does not require treatment. Patient would like to move forward with cancelling the appointment, and knows that she can call back to reschedule if she changes her mind in the future.

## 2024-05-30 ENCOUNTER — Ambulatory Visit: Admitting: Dermatology

## 2024-08-27 ENCOUNTER — Ambulatory Visit: Admitting: Family Medicine
